# Patient Record
Sex: Male | Born: 1960 | Race: White | Hispanic: No | Marital: Single | Smoking: Light tobacco smoker
Health system: Southern US, Community
[De-identification: ages and names within clinical notes are randomized; demographics above are authoritative.]

## PROBLEM LIST (undated history)

## (undated) DIAGNOSIS — F200 Paranoid schizophrenia: Secondary | ICD-10-CM

## (undated) DIAGNOSIS — I219 Acute myocardial infarction, unspecified: Secondary | ICD-10-CM

## (undated) DIAGNOSIS — I2510 Atherosclerotic heart disease of native coronary artery without angina pectoris: Secondary | ICD-10-CM

## (undated) DIAGNOSIS — I739 Peripheral vascular disease, unspecified: Secondary | ICD-10-CM

## (undated) DIAGNOSIS — I10 Essential (primary) hypertension: Secondary | ICD-10-CM

## (undated) DIAGNOSIS — E119 Type 2 diabetes mellitus without complications: Secondary | ICD-10-CM

## (undated) DIAGNOSIS — R9439 Abnormal result of other cardiovascular function study: Secondary | ICD-10-CM

## (undated) DIAGNOSIS — E785 Hyperlipidemia, unspecified: Secondary | ICD-10-CM

## (undated) HISTORY — DX: Atherosclerotic heart disease of native coronary artery without angina pectoris: I25.10

## (undated) HISTORY — DX: Paranoid schizophrenia: F20.0

## (undated) HISTORY — DX: Type 2 diabetes mellitus without complications: E11.9

## (undated) HISTORY — DX: Peripheral vascular disease, unspecified: I73.9

## (undated) HISTORY — PX: COLONOSCOPY: SHX174

## (undated) HISTORY — DX: Acute myocardial infarction, unspecified: I21.9

## (undated) HISTORY — DX: Essential (primary) hypertension: I10

## (undated) HISTORY — DX: Hyperlipidemia, unspecified: E78.5

---

## 2009-12-12 DIAGNOSIS — I2510 Atherosclerotic heart disease of native coronary artery without angina pectoris: Secondary | ICD-10-CM

## 2009-12-12 DIAGNOSIS — I219 Acute myocardial infarction, unspecified: Secondary | ICD-10-CM

## 2009-12-12 HISTORY — DX: Acute myocardial infarction, unspecified: I21.9

## 2009-12-12 HISTORY — DX: Atherosclerotic heart disease of native coronary artery without angina pectoris: I25.10

## 2009-12-12 HISTORY — PX: CORONARY ARTERY BYPASS GRAFT: SHX141

## 2014-07-20 DIAGNOSIS — IMO0002 Reserved for concepts with insufficient information to code with codable children: Secondary | ICD-10-CM | POA: Diagnosis not present

## 2014-07-20 DIAGNOSIS — S139XXA Sprain of joints and ligaments of unspecified parts of neck, initial encounter: Secondary | ICD-10-CM | POA: Diagnosis not present

## 2014-08-07 DIAGNOSIS — I10 Essential (primary) hypertension: Secondary | ICD-10-CM | POA: Diagnosis not present

## 2014-08-07 DIAGNOSIS — E785 Hyperlipidemia, unspecified: Secondary | ICD-10-CM | POA: Diagnosis not present

## 2014-08-07 DIAGNOSIS — E559 Vitamin D deficiency, unspecified: Secondary | ICD-10-CM | POA: Diagnosis not present

## 2014-08-07 DIAGNOSIS — D649 Anemia, unspecified: Secondary | ICD-10-CM | POA: Diagnosis not present

## 2014-08-12 DIAGNOSIS — I10 Essential (primary) hypertension: Secondary | ICD-10-CM | POA: Diagnosis not present

## 2014-08-12 DIAGNOSIS — Z0000 Encounter for general adult medical examination without abnormal findings: Secondary | ICD-10-CM | POA: Diagnosis not present

## 2014-08-12 DIAGNOSIS — E7800 Pure hypercholesterolemia, unspecified: Secondary | ICD-10-CM | POA: Diagnosis not present

## 2014-08-12 DIAGNOSIS — E119 Type 2 diabetes mellitus without complications: Secondary | ICD-10-CM | POA: Diagnosis not present

## 2014-08-12 DIAGNOSIS — E559 Vitamin D deficiency, unspecified: Secondary | ICD-10-CM | POA: Diagnosis not present

## 2014-08-27 DIAGNOSIS — I10 Essential (primary) hypertension: Secondary | ICD-10-CM | POA: Diagnosis not present

## 2014-09-08 DIAGNOSIS — M5430 Sciatica, unspecified side: Secondary | ICD-10-CM | POA: Diagnosis not present

## 2014-09-08 DIAGNOSIS — M545 Low back pain, unspecified: Secondary | ICD-10-CM | POA: Diagnosis not present

## 2014-11-04 DIAGNOSIS — M542 Cervicalgia: Secondary | ICD-10-CM | POA: Diagnosis not present

## 2014-11-04 DIAGNOSIS — M549 Dorsalgia, unspecified: Secondary | ICD-10-CM | POA: Diagnosis not present

## 2014-11-23 DIAGNOSIS — I2510 Atherosclerotic heart disease of native coronary artery without angina pectoris: Secondary | ICD-10-CM | POA: Diagnosis not present

## 2014-11-23 DIAGNOSIS — Z951 Presence of aortocoronary bypass graft: Secondary | ICD-10-CM | POA: Diagnosis not present

## 2014-11-23 DIAGNOSIS — K029 Dental caries, unspecified: Secondary | ICD-10-CM | POA: Diagnosis not present

## 2014-11-23 DIAGNOSIS — F17200 Nicotine dependence, unspecified, uncomplicated: Secondary | ICD-10-CM | POA: Diagnosis not present

## 2014-11-23 DIAGNOSIS — E780 Pure hypercholesterolemia: Secondary | ICD-10-CM | POA: Diagnosis not present

## 2014-11-23 DIAGNOSIS — K088 Other specified disorders of teeth and supporting structures: Secondary | ICD-10-CM | POA: Diagnosis not present

## 2014-11-23 DIAGNOSIS — E119 Type 2 diabetes mellitus without complications: Secondary | ICD-10-CM | POA: Diagnosis not present

## 2014-11-23 DIAGNOSIS — I10 Essential (primary) hypertension: Secondary | ICD-10-CM | POA: Diagnosis not present

## 2015-02-21 DIAGNOSIS — R202 Paresthesia of skin: Secondary | ICD-10-CM | POA: Diagnosis not present

## 2015-02-21 DIAGNOSIS — M79604 Pain in right leg: Secondary | ICD-10-CM | POA: Diagnosis not present

## 2015-02-21 DIAGNOSIS — M79661 Pain in right lower leg: Secondary | ICD-10-CM | POA: Diagnosis not present

## 2015-02-26 DIAGNOSIS — I70213 Atherosclerosis of native arteries of extremities with intermittent claudication, bilateral legs: Secondary | ICD-10-CM | POA: Diagnosis not present

## 2015-02-26 DIAGNOSIS — I10 Essential (primary) hypertension: Secondary | ICD-10-CM | POA: Diagnosis not present

## 2015-02-26 DIAGNOSIS — I2510 Atherosclerotic heart disease of native coronary artery without angina pectoris: Secondary | ICD-10-CM | POA: Diagnosis not present

## 2015-02-26 DIAGNOSIS — E780 Pure hypercholesterolemia: Secondary | ICD-10-CM | POA: Diagnosis not present

## 2015-03-23 DIAGNOSIS — Z5321 Procedure and treatment not carried out due to patient leaving prior to being seen by health care provider: Secondary | ICD-10-CM | POA: Diagnosis not present

## 2015-03-23 DIAGNOSIS — R200 Anesthesia of skin: Secondary | ICD-10-CM | POA: Diagnosis not present

## 2015-03-23 DIAGNOSIS — I748 Embolism and thrombosis of other arteries: Secondary | ICD-10-CM | POA: Diagnosis not present

## 2015-03-23 DIAGNOSIS — Z79899 Other long term (current) drug therapy: Secondary | ICD-10-CM | POA: Diagnosis not present

## 2015-03-23 DIAGNOSIS — I252 Old myocardial infarction: Secondary | ICD-10-CM | POA: Diagnosis not present

## 2015-03-23 DIAGNOSIS — F17200 Nicotine dependence, unspecified, uncomplicated: Secondary | ICD-10-CM | POA: Diagnosis not present

## 2015-03-23 DIAGNOSIS — I743 Embolism and thrombosis of arteries of the lower extremities: Secondary | ICD-10-CM | POA: Diagnosis not present

## 2015-03-23 DIAGNOSIS — E119 Type 2 diabetes mellitus without complications: Secondary | ICD-10-CM | POA: Diagnosis not present

## 2015-03-23 DIAGNOSIS — I2510 Atherosclerotic heart disease of native coronary artery without angina pectoris: Secondary | ICD-10-CM | POA: Diagnosis not present

## 2015-03-23 DIAGNOSIS — I10 Essential (primary) hypertension: Secondary | ICD-10-CM | POA: Diagnosis not present

## 2015-03-26 ENCOUNTER — Encounter: Payer: Self-pay | Admitting: *Deleted

## 2015-03-26 ENCOUNTER — Other Ambulatory Visit: Payer: Self-pay | Admitting: *Deleted

## 2015-03-26 DIAGNOSIS — I739 Peripheral vascular disease, unspecified: Secondary | ICD-10-CM

## 2015-03-27 ENCOUNTER — Ambulatory Visit (INDEPENDENT_AMBULATORY_CARE_PROVIDER_SITE_OTHER): Payer: Medicare Other | Admitting: Vascular Surgery

## 2015-03-27 ENCOUNTER — Other Ambulatory Visit: Payer: Self-pay

## 2015-03-27 ENCOUNTER — Ambulatory Visit (HOSPITAL_COMMUNITY)
Admission: RE | Admit: 2015-03-27 | Discharge: 2015-03-27 | Disposition: A | Discharge status: Home / Self Care | Payer: Medicare Other | Source: Ambulatory Visit | Attending: Vascular Surgery | Admitting: Vascular Surgery

## 2015-03-27 ENCOUNTER — Encounter: Payer: Self-pay | Admitting: Vascular Surgery

## 2015-03-27 VITALS — BP 129/72 | HR 82 | Ht 70.0 in | Wt 201.7 lb

## 2015-03-27 DIAGNOSIS — I70223 Atherosclerosis of native arteries of extremities with rest pain, bilateral legs: Secondary | ICD-10-CM | POA: Diagnosis not present

## 2015-03-27 DIAGNOSIS — I70229 Atherosclerosis of native arteries of extremities with rest pain, unspecified extremity: Secondary | ICD-10-CM

## 2015-03-27 DIAGNOSIS — I739 Peripheral vascular disease, unspecified: Secondary | ICD-10-CM | POA: Diagnosis not present

## 2015-03-27 NOTE — Progress Notes (Signed)
Referred by:  [facility]  Reason for referral: right foot ischemia  History of Present Illness  Hector RavelJohn Wayne Green is a 54 y.o. (1961/07/05) male who presents with chief complaint: right foot numbness and pain that started 2.5 weeks ago. The pain is sharp and "stab-like" when he wears certain shoes or walks on "rocks." He also describes a history of bilateral calf claudication, right significantly greater than left, for the past 3 years. He describes being able to walk less than a block before having to stop. Rest make this pain feel better. He denies any buttock or thigh claudication. He denies any prior history of non-healing wounds or rest pain. Atherosclerotic risk factors include: current smoker, diabetes mellitus, hypertension, hyperlipidemia, CAD. He has a past medical history of CABG with right great saphenous vein harvest.   He [place] ago [place] to be closer to his 53 year old son.  He is on a baby aspirin. He is not on a statin. He is not on any blood thinners. He is on an antihypertensive medication and metformin. He is unable to name all his current medications.   Past Medical History  Diagnosis Date  . Myocardial infarction   . Hypertension   . Diabetes mellitus without complication   . Hyperlipidemia   . Peripheral arterial disease     Past Surgical History  Procedure Laterality Date  . Coronary artery bypass graft      History   Social History  . Marital Status: Unknown    Spouse Name: N/A  . Number of Children: N/A  . Years of Education: N/A   Occupational History  . Not on file.   Social History Main Topics  . Smoking status: Current Every Day Smoker -- 0.50 packs/day for 15 years    Types: Cigarettes  . Smokeless tobacco: Not on file  . Alcohol Use: No  . Drug Use: No  . Sexual Activity: Not on file   Other Topics Concern  . Not on file   Social History Narrative    Family History  Problem Relation Age of Onset    . Cancer Father       Current Outpatient Prescriptions on File Prior to Visit  Medication Sig Dispense Refill  . metFORMIN (GLUCOPHAGE) 500 MG tablet Take 500 mg by mouth.     No current facility-administered medications on file prior to visit.    Allergies  Allergen Reactions  . Fish-Derived Products Anaphylaxis  . Penicillins Anaphylaxis    REVIEW OF SYSTEMS:  (Positives checked otherwise negative)  CARDIOVASCULAR:  []  chest pain, []  chest pressure, []  palpitations, []  shortness of breath when laying flat, []  shortness of breath with exertion,  [x]  pain in feet when walking, [x]  pain in feet when laying flat, []  history of blood clot in veins (DVT), []  history of phlebitis, []  swelling in legs, []  varicose veins  PULMONARY:  []  productive cough, []  asthma, []  wheezing  NEUROLOGIC:  [x]  weakness in arms or legs, [x]  numbness in arms or legs, []  difficulty speaking or slurred speech, []  temporary loss of vision in one eye, []  dizziness  HEMATOLOGIC:  []  bleeding problems, []  problems with blood clotting too easily  MUSCULOSKEL:  []  joint pain, []  joint swelling  GASTROINTEST:  []  vomiting blood, []  blood in stool     GENITOURINARY:  []  burning with urination, []  blood in urine  PSYCHIATRIC:  []  history of major depression  INTEGUMENTARY:  []  rashes, []   ulcers  CONSTITUTIONAL:   fever,  chills  For VQI Use Only    PRE-ADM LIVING: Home  AMB STATUS: Ambulatory  CAD Sx: None  PRIOR CHF: None  STRESS TEST:  No,  Normal,  + ischemia,  + MI,  Both   Physical Examination  Filed Vitals:   03/27/15 1608  BP: 129/72  Pulse: 82  Height:  (1.778 m)  Weight: 201 lb 11.2 oz (91.491 kg)  SpO2: 98%   Body mass index is 28.94 kg/(m^2).  General: A&O x 3, WDWN male in NAD  Head: [place]/AT  Neck: Supple, no LAD  Pulmonary: Sym exp, good air movt, CTAB, no rales, rhonchi, & wheezing  Cardiac: RRR, Nl S1, S2, no Murmurs, rubs or  gallops  Vascular: Vessel Right Left  Radial Palpable Palpable  Ulnar Palpable Palpable  Brachial Palpable Palpable  Carotid Palpable, without bruit Palpable, without bruit  Aorta Not palpable N/A  Femoral Palpable, weaker than left Palpable  Popliteal Not palpable Not palpable  PT Not palpable Not palpable  DP Not palpable Not palpable   Gastrointestinal: soft, NTND, -G/R, - HSM, - masses  Musculoskeletal: M/S 5/5 throughout, Extremities without obvious ischemic changes. No wounds, no gangrene, no cellulitis.  R forefoot rubor  Neurologic: CN 2-12 intact. Pain and light touch intact in extremities except R foot decreased. Motor exam as listed above  Psychiatric: Judgment intact, Mood & affect appropriate for pt's clinical situation  Dermatologic: See M/S exam for extremity exam, no rashes otherwise noted  Non-Invasive Vascular Imaging  ABI (Date: 03/27/2015)  R: 0.36, TBI: not detected, monophasic PT, peroneal and AT not dected  L: 0.71, TBI: 0.40, monophasic peroneal and PT, AT not detected.   Outside Studies/Documentation 8 pages of outside documents were reviewed including: medical records from [facility] and CTA aortibifemoral results  Medical Decision Making  Americo Vallery is a 54 y.o. male who presents with: right lower leg critical limb ischemia, left lower leg intermittent claudication, likely bilateral distal SFA/proximal popliteal occlusions   Explained to the patient that he is at risk for limb loss.   Given the limb threatening status of this patient, I recommend an aggressive work up including proceeding with an: Aortogram, Bilateral runoff and possible right leg intervention. I discussed with the patient the nature of angiographic procedures, especially the limited patencies of any endovascular intervention. The patient is aware of that the risks of an angiographic procedure include but are not limited to: bleeding, infection, access site  complications, possible need for emergent surgical intervention, and possible need for surgical procedures to treat the patient's pathology. The patient is aware of the risks and agrees to proceed.  The procedure is scheduled for: 03/30/15 with Dr. Imogene Burn.  Discussed that he may need to be admitted following his procedure and be cleared by cardiology if he requires surgical revascularization. He has a history of right GSV harvest for CABG.  The patient is currently not on a statin.  The patient is currently on an anti-platelet: ASA.    Maris Berger, PA-C [facility] [place] Office: [phone] Pager: [phone]  03/27/2015, 4:44 PM  This patient was seen in conjunction with Dr. Imogene Burn.  Addendum  I have independently interviewed and examined the patient, and I agree with the physician assistant's findings.  Pt does not have a threatened R limb despite his numbness as he has intact PF/DF.  He has had numbness in the right foot > 2  weeks now.  On ABI, he has a flatline tracing in AT and peroneal with a tracing in PT c/w critical disease.  He will need ASAP angiographic studies to determine his reconstruction options.  I will likely admit him post-operatively to expedite his cardiac work-up and vein mapping, with plan of possible revascularization by Wednesday if nothing endovascularly is possible.  His history is not consistent with acute thromboembolism but his history is also not entire consistent with acute on chronic PAD.  I reviewed his CTA Abd/pelvis with runoff.  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  I can't tell if he as persistent perfusion in his tibials bilaterally or extensive calcification due to the poor quality of study.  Leonides Sake, MD [facility] [place] Office: [phone] Pager: [phone]  03/27/2015, 5:05 PM

## 2015-03-29 MED ORDER — SODIUM CHLORIDE 0.9 % IV SOLN
INTRAVENOUS | Status: DC
  Administered 2015-03-30: 11:00:00 via INTRAVENOUS

## 2015-03-30 ENCOUNTER — Ambulatory Visit (HOSPITAL_COMMUNITY)
Admission: RE | Admit: 2015-03-30 | Discharge: 2015-03-30 | Disposition: A | Discharge status: Home / Self Care | Payer: Medicare Other | Source: Ambulatory Visit | Attending: Vascular Surgery | Admitting: Vascular Surgery

## 2015-03-30 ENCOUNTER — Encounter (HOSPITAL_COMMUNITY)
Admission: RE | Disposition: A | Discharge status: Home / Self Care | Payer: Self-pay | Source: Ambulatory Visit | Attending: Vascular Surgery

## 2015-03-30 ENCOUNTER — Encounter (HOSPITAL_COMMUNITY): Payer: Self-pay | Admitting: Vascular Surgery

## 2015-03-30 DIAGNOSIS — Z951 Presence of aortocoronary bypass graft: Secondary | ICD-10-CM

## 2015-03-30 DIAGNOSIS — I70221 Atherosclerosis of native arteries of extremities with rest pain, right leg: Secondary | ICD-10-CM

## 2015-03-30 DIAGNOSIS — Z79899 Other long term (current) drug therapy: Secondary | ICD-10-CM

## 2015-03-30 DIAGNOSIS — E785 Hyperlipidemia, unspecified: Secondary | ICD-10-CM | POA: Diagnosis not present

## 2015-03-30 DIAGNOSIS — I10 Essential (primary) hypertension: Secondary | ICD-10-CM | POA: Diagnosis present

## 2015-03-30 DIAGNOSIS — Z7982 Long term (current) use of aspirin: Secondary | ICD-10-CM | POA: Insufficient documentation

## 2015-03-30 DIAGNOSIS — I252 Old myocardial infarction: Secondary | ICD-10-CM

## 2015-03-30 DIAGNOSIS — E119 Type 2 diabetes mellitus without complications: Secondary | ICD-10-CM

## 2015-03-30 DIAGNOSIS — I701 Atherosclerosis of renal artery: Secondary | ICD-10-CM

## 2015-03-30 DIAGNOSIS — F17210 Nicotine dependence, cigarettes, uncomplicated: Secondary | ICD-10-CM

## 2015-03-30 DIAGNOSIS — I2510 Atherosclerotic heart disease of native coronary artery without angina pectoris: Secondary | ICD-10-CM | POA: Diagnosis present

## 2015-03-30 DIAGNOSIS — I70202 Unspecified atherosclerosis of native arteries of extremities, left leg: Secondary | ICD-10-CM | POA: Insufficient documentation

## 2015-03-30 DIAGNOSIS — I70229 Atherosclerosis of native arteries of extremities with rest pain, unspecified extremity: Secondary | ICD-10-CM | POA: Diagnosis present

## 2015-03-30 DIAGNOSIS — Z5321 Procedure and treatment not carried out due to patient leaving prior to being seen by health care provider: Secondary | ICD-10-CM | POA: Diagnosis present

## 2015-03-30 HISTORY — PX: ABDOMINAL AORTAGRAM: SHX5454

## 2015-03-30 HISTORY — PX: LOWER EXTREMITY ANGIOGRAM: SHX5508

## 2015-03-30 LAB — POCT I-STAT, CHEM 8
BUN: 19 mg/dL (ref 6–23)
CALCIUM ION: 1.17 mmol/L (ref 1.12–1.23)
Chloride: 101 mmol/L (ref 96–112)
Creatinine, Ser: 0.8 mg/dL (ref 0.50–1.35)
Glucose, Bld: 288 mg/dL — ABNORMAL HIGH (ref 70–99)
HEMATOCRIT: 49 % (ref 39.0–52.0)
HEMOGLOBIN: 16.7 g/dL (ref 13.0–17.0)
Potassium: 4.8 mmol/L (ref 3.5–5.1)
Sodium: 134 mmol/L — ABNORMAL LOW (ref 135–145)
TCO2: 21 mmol/L (ref 0–100)

## 2015-03-30 SURGERY — ABDOMINAL AORTAGRAM
Anesthesia: LOCAL

## 2015-03-30 MED ORDER — OXYCODONE-ACETAMINOPHEN 5-325 MG PO TABS: 1.0000 | ORAL_TABLET | ORAL | Status: DC | PRN

## 2015-03-30 MED ORDER — MORPHINE SULFATE 2 MG/ML IJ SOLN: 2.0000 mg | INTRAMUSCULAR | Status: DC | PRN

## 2015-03-30 MED ORDER — HEPARIN (PORCINE) IN NACL 2-0.9 UNIT/ML-% IJ SOLN
INTRAMUSCULAR | Status: AC
  Filled 2015-03-30: qty 1000

## 2015-03-30 MED ORDER — FENTANYL CITRATE (PF) 100 MCG/2ML IJ SOLN
INTRAMUSCULAR | Status: AC
  Filled 2015-03-30: qty 2

## 2015-03-30 MED ORDER — MIDAZOLAM HCL 2 MG/2ML IJ SOLN
INTRAMUSCULAR | Status: AC
  Filled 2015-03-30: qty 2

## 2015-03-30 MED ORDER — SODIUM CHLORIDE 0.9 % IV SOLN: 1.0000 mL/kg/h | INTRAVENOUS | Status: DC

## 2015-03-30 MED ORDER — LIDOCAINE HCL (PF) 1 % IJ SOLN
INTRAMUSCULAR | Status: AC
  Filled 2015-03-30: qty 30

## 2015-03-30 NOTE — Discharge Instructions (Addendum)
°  ADMITTING WILL CALL TOMORROW TO GIVE YOU A TIME TO ARRIVE TO BE ADMITTED. SURGERY IS SCHEDULED Wednesday.  HOLD METFORMIN FOR 2 DAYS.  Arteriogram Care After These instructions give you information on caring for yourself after your procedure. Your doctor may also give you more specific instructions. Call your doctor if you have any problems or questions after your procedure. HOME CARE  Do not bathe, swim, or use a hot tub until directed by your doctor. You can shower.  Do not lift anything heavier than 10 pounds (about a gallon of milk) for 2 days.  Do not walk a lot, run, or drive for 2 days.  Return to normal activities in 2 days or as told by your doctor. Finding out the results of your test Ask when your test results will be ready. Make sure you get your test results. GET HELP RIGHT AWAY IF:   You have fever.  You have more pain in your leg.  The leg that was cut is:  Bleeding.  Puffy (swollen) or red.  Cold.  Pale or changes color.  Weak.  Tingly or numb. If you go to the Emergency Room, tell your nurse that you have had an arteriogram. Take this paper with you to show the nurse. MAKE SURE YOU:  Understand these instructions.  Will watch your condition.  Will get help right away if you are not doing well or get worse. Document Released: 02/24/2009 Document Revised: 12/03/2013 Document Reviewed: 02/24/2009 [facility] Patient Information 2015 [place] [place]. This information is not intended to replace advice given to you by your health care provider. Make sure you discuss any questions you have with your health care provider.

## 2015-03-30 NOTE — Interval H&P Note (Signed)
[facility] [place]  History and Physical Update  The patient was interviewed and re-examined.  The patient's previous History and Physical has been reviewed and is unchanged from my consult.  There is no change in the plan of care: Aortogram, bilateral leg runoff, possible right leg intervention.  I discussed with the patient the nature of angiographic procedures, especially the limited patencies of any endovascular intervention.  The patient is aware of that the risks of an angiographic procedure include but are not limited to: bleeding, infection, access site complications, renal failure, embolization, rupture of vessel, dissection, possible need for emergent surgical intervention, possible need for surgical procedures to treat the patient's pathology, anaphylactic reaction to contrast, and stroke and death.  The patient is aware of the risks and agrees to proceed.   Leonides SakeBrian Drexel Ivey, MD [facility] [place] Office: [phone] Pager: [phone]  03/30/2015, 10:45 AM

## 2015-03-30 NOTE — H&P (View-Only) (Signed)
  Referred by:  [facility]  Reason for referral: right foot ischemia  History of Present Illness  Hector Green is a 54 y.o. (08/13/1961) male who presents with chief complaint: right foot numbness and pain that started 2.5 weeks ago. The pain is sharp and "stab-like" when he wears certain shoes or walks on "rocks." He also describes a history of bilateral calf claudication, right significantly greater than left, for the past 3 years. He describes being able to walk less than a block before having to stop. Rest make this pain feel better. He denies any buttock or thigh claudication. He denies any prior history of non-healing wounds or rest pain. Atherosclerotic risk factors include: current smoker, diabetes mellitus, hypertension, hyperlipidemia, CAD. He has a past medical history of CABG with right great saphenous vein harvest.   He recently moved here one week ago from [place] to be closer to his 7 year old son.  He is on a baby aspirin. He is not on a statin. He is not on any blood thinners. He is on an antihypertensive medication and metformin. He is unable to name all his current medications.   Past Medical History  Diagnosis Date  . Myocardial infarction   . Hypertension   . Diabetes mellitus without complication   . Hyperlipidemia   . Peripheral arterial disease     Past Surgical History  Procedure Laterality Date  . Coronary artery bypass graft      History   Social History  . Marital Status: Unknown    Spouse Name: N/A  . Number of Children: N/A  . Years of Education: N/A   Occupational History  . Not on file.   Social History Main Topics  . Smoking status: Current Every Day Smoker -- 0.50 packs/day for 15 years    Types: Cigarettes  . Smokeless tobacco: Not on file  . Alcohol Use: No  . Drug Use: No  . Sexual Activity: Not on file   Other Topics Concern  . Not on file   Social History Narrative    Family History  Problem Relation Age of Onset    . Cancer Father       Current Outpatient Prescriptions on File Prior to Visit  Medication Sig Dispense Refill  . metFORMIN (GLUCOPHAGE) 500 MG tablet Take 500 mg by mouth.     No current facility-administered medications on file prior to visit.    Allergies  Allergen Reactions  . Fish-Derived Products Anaphylaxis  . Penicillins Anaphylaxis    REVIEW OF SYSTEMS:  (Positives checked otherwise negative)  CARDIOVASCULAR:  [] chest pain, [] chest pressure, [] palpitations, [] shortness of breath when laying flat, [] shortness of breath with exertion,  [x] pain in feet when walking, [x] pain in feet when laying flat, [] history of blood clot in veins (DVT), [] history of phlebitis, [] swelling in legs, [] varicose veins  PULMONARY:  [] productive cough, [] asthma, [] wheezing  NEUROLOGIC:  [x] weakness in arms or legs, [x] numbness in arms or legs, [] difficulty speaking or slurred speech, [] temporary loss of vision in one eye, [] dizziness  HEMATOLOGIC:  [] bleeding problems, [] problems with blood clotting too easily  MUSCULOSKEL:  [] joint pain, [] joint swelling  GASTROINTEST:  [] vomiting blood, [] blood in stool     GENITOURINARY:  [] burning with urination, [] blood in urine  PSYCHIATRIC:  [] history of major depression  INTEGUMENTARY:  [] rashes, []   ulcers  CONSTITUTIONAL:  [] fever, [] chills  For VQI Use Only    PRE-ADM LIVING: Home  AMB STATUS: Ambulatory  CAD Sx: None  PRIOR CHF: None  STRESS TEST: [x] No, [ ] Normal, [ ] + ischemia, [ ] + MI, [ ] Both   Physical Examination  Filed Vitals:   03/27/15 1608  BP: 129/72  Pulse: 82  Height: 5' 10" (1.778 m)  Weight: 201 lb 11.2 oz (91.491 kg)  SpO2: 98%   Body mass index is 28.94 kg/(m^2).  General: A&O x 3, WDWN male in NAD  Head: [place]/AT  Neck: Supple, no LAD  Pulmonary: Sym exp, good air movt, CTAB, no rales, rhonchi, & wheezing  Cardiac: RRR, Nl S1, S2, no Murmurs, rubs or  gallops  Vascular: Vessel Right Left  Radial Palpable Palpable  Ulnar Palpable Palpable  Brachial Palpable Palpable  Carotid Palpable, without bruit Palpable, without bruit  Aorta Not palpable N/A  Femoral Palpable, weaker than left Palpable  Popliteal Not palpable Not palpable  PT Not palpable Not palpable  DP Not palpable Not palpable   Gastrointestinal: soft, NTND, -G/R, - HSM, - masses  Musculoskeletal: M/S 5/5 throughout, Extremities without obvious ischemic changes. No wounds, no gangrene, no cellulitis.  R forefoot rubor  Neurologic: CN 2-12 intact. Pain and light touch intact in extremities except R foot decreased. Motor exam as listed above  Psychiatric: Judgment intact, Mood & affect appropriate for pt's clinical situation  Dermatologic: See M/S exam for extremity exam, no rashes otherwise noted  Non-Invasive Vascular Imaging  ABI (Date: 03/27/2015)  R: 0.36, TBI: not detected, monophasic PT, peroneal and AT not dected  L: 0.71, TBI: 0.40, monophasic peroneal and PT, AT not detected.   Outside Studies/Documentation 8 pages of outside documents were reviewed including: medical records from [facility] and CTA aortibifemoral results  Medical Decision Making  Hector Green is a 54 y.o. male who presents with: right lower leg critical limb ischemia, left lower leg intermittent claudication, likely bilateral distal SFA/proximal popliteal occlusions   Explained to the patient that he is at risk for limb loss.   Given the limb threatening status of this patient, I recommend an aggressive work up including proceeding with an: Aortogram, Bilateral runoff and possible right leg intervention. I discussed with the patient the nature of angiographic procedures, especially the limited patencies of any endovascular intervention. The patient is aware of that the risks of an angiographic procedure include but are not limited to: bleeding, infection, access site  complications, possible need for emergent surgical intervention, and possible need for surgical procedures to treat the patient's pathology. The patient is aware of the risks and agrees to proceed.  The procedure is scheduled for: 03/30/15 with Dr. Kamil Hanigan.  Discussed that he may need to be admitted following his procedure and be cleared by cardiology if he requires surgical revascularization. He has a history of right GSV harvest for CABG.  The patient is currently not on a statin.  The patient is currently on an anti-platelet: ASA.    Kimberly Trinh, PA-C [facility] [place] Office: [phone] Pager: [phone]  03/27/2015, 4:44 PM  This patient was seen in conjunction with Dr. Hollie Bartus.  Addendum  I have independently interviewed and examined the patient, and I agree with the physician assistant's findings.  Pt does not have a threatened R limb despite his numbness as he has intact PF/DF.  He has had numbness in the right foot > 2   weeks now.  On ABI, he has a flatline tracing in AT and peroneal with a tracing in PT c/w critical disease.  He will need ASAP angiographic studies to determine his reconstruction options.  I will likely admit him post-operatively to expedite his cardiac work-up and vein mapping, with plan of possible revascularization by Wednesday if nothing endovascularly is possible.  His history is not consistent with acute thromboembolism but his history is also not entire consistent with acute on chronic PAD.  I reviewed his CTA Abd/pelvis with runoff.  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  I can't tell if he as persistent perfusion in his tibials bilaterally or extensive calcification due to the poor quality of study.  Lashe Oliveira, MD [facility]  Office: [phone] Pager: [phone]  03/27/2015, 5:05 PM   

## 2015-03-30 NOTE — Op Note (Addendum)
OPERATIVE NOTE   PROCEDURE: 1.  Left common femoral artery cannulation under ultrasound guidance 2.  Placement of catheter in aorta 3.  Aortogram 4.  Second order arterial selection 5.  Right leg runoff via catheter 6.  Left leg runoff via sheath 7.  Radiology S&I  PRE-OPERATIVE DIAGNOSIS: right leg rest pain  POST-OPERATIVE DIAGNOSIS: same as above   SURGEON: Leonides Sake, MD  ANESTHESIA: conscious sedation  ESTIMATED BLOOD LOSS: 50 cc  CONTRAST: 90 cc  FINDING(S):  Aorta: patent  Superior mesenteric artery: patent Celiac artery: not visualized   Right Left  RA Patent, 50% stenosis in proximal segment Patent  CIA Patent, calcific Patent, calcific  EIA Patent, calcific Patent, calcific  IIA Patent Patent  CFA Patent, 50% proximal stenosis Patent  SFA Patent proximally, 50% proximal 1/3 stenosis with distal short segment occlusion Patent, 50% proximal segment stenosis, long 50-75% stenosis in mid-segment with distal occlusion  PFA Patent proximally, diseased distally past first bifurcation Patent, collaterals reconstitute above-the-knee popliteal artery  Pop Reconstituted via extensive collaterals, diffusely diseased Patent, reconstituted from profunda collaterals  Trif Occluded Patent, medial geniculate provides additional perfusion  AT Occluded Occluded  Pero Occluded proximally, reconstituted via collaterals distally but limited distal perfusion Patent  PT Patent, reconstituted via medial collaterals, only runoff Patent   SPECIMEN(S):  none  INDICATIONS:   Hector Green is a 54 y.o. male who presents with two months of anesthesia in his right leg with severe to critical PAD in right leg.  The patient presents for: aortogram, bilateral leg runoff, possible right leg intervention.  I discussed with the patient the nature of angiographic procedures, especially the limited patencies of any endovascular intervention.  The patient is aware of that the risks of an  angiographic procedure include but are not limited to: bleeding, infection, access site complications, renal failure, embolization, rupture of vessel, dissection, possible need for emergent surgical intervention, possible need for surgical procedures to treat the patient's pathology, and stroke and death.  The patient is aware of the risks and agrees to proceed.  DESCRIPTION: After full informed consent was obtained from the patient, the patient was brought back to the angiography suite.  The patient was placed supine upon the angiography table and connected to monitoring equipment.  The patient was then given conscious sedation, the amounts of which are documented in the patient's chart.  The patient was prepped and drape in the standard fashion for an angiographic procedure.  At this point, attention was turned to the left groin.  Under ultrasound guidance, the subcutaneous tissue surrounding the left common femoral artery was anesthesized with 1% lidocaine with epinephrine.  The artery was then cannulated with a micropuncture needle.  The microwire was advanced into the iliac arterial system.  The needle was exchanged for a microsheath, which was loaded into the common femoral artery over the wire.  The microwire was exchanged for a [facility] wire which was advanced into the aorta.  The microsheath was then exchanged for a 5-Fr sheath which was loaded into the common femoral artery.  The Omniflush catheter was then loaded over the wire up to the level of L1.  The catheter was connected to the power injector circuit.  After de-airring and de-clotting the circuit, a power injector aortogram was completed.  The [facility] wire was replaced in the catheter, and using the [place] and Omniflush catheter, the right common iliac artery was selected.  The catheter and wire were advanced into the external iliac artery.  An automated right leg runoff was completed.  The findings are listed above.  I replaced the wire into the  catheter, straightening out the crook in the catheter.  Both were removed from the sheath together.  The sheath was aspirated.  No clots were present and the sheath was reloaded with heparinized saline.  The left femoral sheath was connected to a power injector.  An automated left leg runoff was completed.  The findings are listed above.  Based on the images, the patient will need a right common femoral artery to posterior tibial artery bypass.  I offered the patient admission for expedited work-up.  The patient will have to come back tomorrow for such given child care conflicts.  The patient is tenatively scheduled for surgery on this coming Wednesday.   COMPLICATIONS: none  CONDITION: stable   Leonides SakeBrian Ell Tiso, MD [facility] [place] Office: [phone] Pager: [phone]  03/30/2015, 12:56 PM

## 2015-03-31 ENCOUNTER — Inpatient Hospital Stay (HOSPITAL_COMMUNITY)
Admission: AD | Admit: 2015-03-31 | Discharge: 2015-03-31 | DRG: 301 | Discharge status: Against Medical Advice | Payer: Medicare Other | Source: Ambulatory Visit | Attending: Vascular Surgery | Admitting: Vascular Surgery

## 2015-03-31 ENCOUNTER — Encounter (HOSPITAL_COMMUNITY): Payer: Self-pay | Admitting: General Practice

## 2015-03-31 DIAGNOSIS — I10 Essential (primary) hypertension: Secondary | ICD-10-CM | POA: Diagnosis present

## 2015-03-31 DIAGNOSIS — I701 Atherosclerosis of renal artery: Secondary | ICD-10-CM | POA: Diagnosis present

## 2015-03-31 DIAGNOSIS — I70229 Atherosclerosis of native arteries of extremities with rest pain, unspecified extremity: Secondary | ICD-10-CM

## 2015-03-31 DIAGNOSIS — F17210 Nicotine dependence, cigarettes, uncomplicated: Secondary | ICD-10-CM | POA: Diagnosis present

## 2015-03-31 DIAGNOSIS — E785 Hyperlipidemia, unspecified: Secondary | ICD-10-CM | POA: Diagnosis present

## 2015-03-31 DIAGNOSIS — Z01818 Encounter for other preprocedural examination: Secondary | ICD-10-CM | POA: Diagnosis not present

## 2015-03-31 DIAGNOSIS — I252 Old myocardial infarction: Secondary | ICD-10-CM | POA: Diagnosis not present

## 2015-03-31 DIAGNOSIS — Z79899 Other long term (current) drug therapy: Secondary | ICD-10-CM | POA: Diagnosis not present

## 2015-03-31 DIAGNOSIS — I70221 Atherosclerosis of native arteries of extremities with rest pain, right leg: Secondary | ICD-10-CM | POA: Diagnosis present

## 2015-03-31 DIAGNOSIS — I2510 Atherosclerotic heart disease of native coronary artery without angina pectoris: Secondary | ICD-10-CM | POA: Diagnosis present

## 2015-03-31 DIAGNOSIS — E119 Type 2 diabetes mellitus without complications: Secondary | ICD-10-CM | POA: Diagnosis present

## 2015-03-31 DIAGNOSIS — Z7982 Long term (current) use of aspirin: Secondary | ICD-10-CM | POA: Diagnosis not present

## 2015-03-31 DIAGNOSIS — Z951 Presence of aortocoronary bypass graft: Secondary | ICD-10-CM | POA: Diagnosis not present

## 2015-03-31 DIAGNOSIS — M79609 Pain in unspecified limb: Secondary | ICD-10-CM | POA: Diagnosis not present

## 2015-03-31 DIAGNOSIS — I998 Other disorder of circulatory system: Secondary | ICD-10-CM | POA: Diagnosis present

## 2015-03-31 DIAGNOSIS — Z5321 Procedure and treatment not carried out due to patient leaving prior to being seen by health care provider: Secondary | ICD-10-CM | POA: Diagnosis present

## 2015-03-31 DIAGNOSIS — R200 Anesthesia of skin: Secondary | ICD-10-CM | POA: Diagnosis not present

## 2015-03-31 LAB — GLUCOSE, CAPILLARY: Glucose-Capillary: 428 mg/dL — ABNORMAL HIGH (ref 70–99)

## 2015-03-31 MED ORDER — PHENOL 1.4 % MT LIQD: 1.0000 | OROMUCOSAL | Status: DC | PRN

## 2015-03-31 MED ORDER — LABETALOL HCL 5 MG/ML IV SOLN
10.0000 mg | INTRAVENOUS | Status: DC | PRN
  Filled 2015-03-31: qty 4

## 2015-03-31 MED ORDER — DOCUSATE SODIUM 100 MG PO CAPS
100.0000 mg | ORAL_CAPSULE | Freq: Every day | ORAL | Status: DC
  Filled 2015-03-31: qty 1

## 2015-03-31 MED ORDER — PANTOPRAZOLE SODIUM 40 MG PO TBEC
40.0000 mg | DELAYED_RELEASE_TABLET | Freq: Every day | ORAL | Status: DC
  Administered 2015-03-31: 40 mg via ORAL
  Filled 2015-03-31: qty 1

## 2015-03-31 MED ORDER — ALUM & MAG HYDROXIDE-SIMETH 200-200-20 MG/5ML PO SUSP: 15.0000 mL | ORAL | Status: DC | PRN

## 2015-03-31 MED ORDER — INSULIN ASPART 100 UNIT/ML ~~LOC~~ SOLN: 0.0000 [IU] | Freq: Three times a day (TID) | SUBCUTANEOUS | Status: DC

## 2015-03-31 MED ORDER — ENOXAPARIN SODIUM 40 MG/0.4ML ~~LOC~~ SOLN
40.0000 mg | SUBCUTANEOUS | Status: DC
  Administered 2015-03-31: 40 mg via SUBCUTANEOUS
  Filled 2015-03-31: qty 0.4

## 2015-03-31 MED ORDER — INSULIN ASPART 100 UNIT/ML ~~LOC~~ SOLN
0.0000 [IU] | Freq: Three times a day (TID) | SUBCUTANEOUS | Status: DC
  Administered 2015-03-31: 15 [IU] via SUBCUTANEOUS

## 2015-03-31 MED ORDER — VANCOMYCIN HCL IN DEXTROSE 1-5 GM/200ML-% IV SOLN: 1000.0000 mg | INTRAVENOUS | Status: DC

## 2015-03-31 MED ORDER — OXYCODONE HCL 5 MG PO TABS: 5.0000 mg | ORAL_TABLET | ORAL | Status: DC | PRN

## 2015-03-31 MED ORDER — ACETAMINOPHEN 650 MG RE SUPP: 325.0000 mg | RECTAL | Status: DC | PRN

## 2015-03-31 MED ORDER — BISACODYL 10 MG RE SUPP: 10.0000 mg | Freq: Every day | RECTAL | Status: DC | PRN

## 2015-03-31 MED ORDER — POTASSIUM CHLORIDE CRYS ER 20 MEQ PO TBCR: 20.0000 meq | EXTENDED_RELEASE_TABLET | Freq: Once | ORAL | Status: DC

## 2015-03-31 MED ORDER — INSULIN ASPART 100 UNIT/ML ~~LOC~~ SOLN
10.0000 [IU] | Freq: Once | SUBCUTANEOUS | Status: AC
  Administered 2015-03-31: 10 [IU] via SUBCUTANEOUS

## 2015-03-31 MED ORDER — HYDRALAZINE HCL 20 MG/ML IJ SOLN: 5.0000 mg | INTRAMUSCULAR | Status: DC | PRN

## 2015-03-31 MED ORDER — MORPHINE SULFATE 2 MG/ML IJ SOLN: 2.0000 mg | INTRAMUSCULAR | Status: DC | PRN

## 2015-03-31 MED ORDER — METOPROLOL TARTRATE 1 MG/ML IV SOLN: 2.0000 mg | INTRAVENOUS | Status: DC | PRN

## 2015-03-31 MED ORDER — ACETAMINOPHEN 325 MG PO TABS: 325.0000 mg | ORAL_TABLET | ORAL | Status: DC | PRN

## 2015-03-31 MED ORDER — ONDANSETRON HCL 4 MG/2ML IJ SOLN: 4.0000 mg | Freq: Four times a day (QID) | INTRAMUSCULAR | Status: DC | PRN

## 2015-03-31 MED ORDER — SODIUM CHLORIDE 0.9 % IV SOLN: INTRAVENOUS | Status: DC

## 2015-03-31 MED ORDER — GUAIFENESIN-DM 100-10 MG/5ML PO SYRP: 15.0000 mL | ORAL_SOLUTION | ORAL | Status: DC | PRN

## 2015-03-31 NOTE — H&P (Signed)
H&P  History of Present Illness  Hector Green is a 54 y.o. (July 14, 1961) male who presents with chief complaint: right foot numbness and pain that started 2.5 weeks ago. The pain is sharp and "stab-like" when he wears certain shoes or walks on "rocks." He also describes a history of bilateral calf claudication, right significantly greater than left, for the past 3 years. He describes being able to walk less than a block before having to stop. Rest make this pain feel better. He denies any buttock or thigh claudication. He denies any prior history of non-healing wounds or rest pain. Atherosclerotic risk factors include: current smoker, diabetes mellitus, hypertension, hyperlipidemia, CAD. He has a past medical history of CABG with right great saphenous vein harvest.   He recently moved here one week ago from [place] to be closer to his 32 year old son. He is on a baby aspirin. He is not on a statin. He is not on any blood thinners. He is on an antihypertensive medication and metformin. He is unable to name all his current medications.   He recently underwent aortogram with bilateral runoff on 03/30/15 by Dr. Imogene Burn for right leg rest pain.  This  revealed an occluded right distal SFA with one veseel runoff via the posterior tibial artery. He was informed that he was at risk for limb loss. Given child care conflicts, he returned to the hospital on 03/31/15 as a direct admission.   Past Medical History  Diagnosis Date  . Myocardial infarction   . Hypertension   . Diabetes mellitus without complication   . Hyperlipidemia   . Peripheral arterial disease     Past Surgical History  Procedure Laterality Date  . Coronary artery bypass graft      History   Social History  . Marital Status: Unknown    Spouse Name: N/A  . Number of Children: N/A  . Years of Education: N/A   Occupational History  . Not on file.   Social History Main  Topics  . Smoking status: Current Every Day Smoker -- 0.50 packs/day for 15 years    Types: Cigarettes  . Smokeless tobacco: Not on file  . Alcohol Use: No  . Drug Use: No  . Sexual Activity: Not on file   Other Topics Concern  . Not on file   Social History Narrative    Family History  Problem Relation Age of Onset  . Cancer Father       Current Outpatient Prescriptions on File Prior to Visit  Medication Sig Dispense Refill  . metFORMIN (GLUCOPHAGE) 500 MG tablet Take 500 mg by mouth.     No current facility-administered medications on file prior to visit.    Allergies  Allergen Reactions  . Fish-Derived Products Anaphylaxis  . Penicillins Anaphylaxis    REVIEW OF SYSTEMS: (Positives checked otherwise negative)  CARDIOVASCULAR:  chest pain,  chest pressure,  palpitations,  shortness of breath when laying flat,  shortness of breath with exertion,  pain in feet when walking,  pain in feet when laying flat,  history of blood clot in veins (DVT),  history of phlebitis,  swelling in legs,  varicose veins  PULMONARY:  productive cough,  asthma,  wheezing  NEUROLOGIC:  weakness in arms or legs,  numbness in arms or legs,  difficulty speaking or slurred speech,  temporary loss of vision in one eye,  dizziness  HEMATOLOGIC:  bleeding problems,  problems with blood clotting too easily  MUSCULOSKEL:  joint  pain, []  joint swelling  GASTROINTEST: []  vomiting blood, []  blood in stool   GENITOURINARY: []  burning with urination, []  blood in urine  PSYCHIATRIC: []  history of major depression  INTEGUMENTARY: []  rashes, []  ulcers  CONSTITUTIONAL: []  fever, []  chills  For VQI Use Only   PRE-ADM LIVING: Home  AMB STATUS: Ambulatory  CAD Sx: None  PRIOR CHF: None  STRESS TEST: [x]  No, [ ]  Normal, [ ]  + ischemia, [ ]  + MI, [ ]   Both   Physical Examination  Filed Vitals:   03/27/15 1608  BP: 129/72  Pulse: 82  Height: 5\' 10"  (1.778 m)  Weight: 201 lb 11.2 oz (91.491 kg)  SpO2: 98%   Body mass index is 28.94 kg/(m^2).  General: A&O x 3, WDWN male in NAD  Head: [place]/AT  Neck: Supple, no LAD  Pulmonary: Sym exp, good air movt, CTAB, no rales, rhonchi, & wheezing  Cardiac: RRR, Nl S1, S2, no Murmurs, rubs or gallops  Vascular: Vessel Right Left  Radial Palpable Palpable  Ulnar Palpable Palpable  Brachial Palpable Palpable  Carotid Palpable, without bruit Palpable, without bruit  Aorta Not palpable N/A  Femoral Palpable, weaker than left Palpable  Popliteal Not palpable Not palpable  PT Not palpable Not palpable  DP Not palpable Not palpable   Gastrointestinal: soft, NTND, -G/R, - HSM, - masses  Musculoskeletal: M/S 5/5 throughout, Extremities without obvious ischemic changes. No wounds, no gangrene, no cellulitis. R forefoot rubor  Neurologic: CN 2-12 intact. Pain and light touch intact in extremities except R foot decreased. Motor exam as listed above  Psychiatric: Judgment intact, Mood & affect appropriate for pt's clinical situation  Dermatologic: See M/S exam for extremity exam, no rashes otherwise noted  Non-Invasive Vascular Imaging  ABI (Date: 03/27/2015)  R: 0.36, TBI: not detected, monophasic PT, peroneal and AT not dected  L: 0.71, TBI: 0.40, monophasic peroneal and PT, AT not detected.  Medical Decision Making  Carney CornersJohn Wayne [name] is a 54 y.o. male who presents with: right lower leg critical limb ischemia, left lower leg intermittent claudication. His arteriogram yesterday revealed an occluded distal left SFA with one vessel runoff via the posterior tibial artery. He was directly admitted today with plans for right common femoral artery to posterior tibial artery bypass tomorrow 04/01/15 with Dr. Imogene Burnhen. Will obtain vein mapping and consult  cardiology for risk stratification and optimization.    Maris BergerKimberly Trinh, PA-C [facility] [place] Office: [phone] Pager: [phone]   Addendum  Since this consult.  The patient has undergone angiography which demonstrates he needs a right CFA to PT bypass for R foot rest pain with severe to critical PAD.  He is being admitted to expedite his his workup: Vein mapping, cardiology work-up for OR tomorrow.     Leonides SakeBrian Chen, MD [facility] [place] Office: [phone] Pager: [phone]  03/31/2015, 12:09 PM

## 2015-03-31 NOTE — Progress Notes (Signed)
Inpatient Diabetes Program Recommendations  AACE/ADA: New Consensus Statement on Inpatient Glycemic Control (2013)  Target Ranges:  Prepandial:   less than 140 mg/dL      Peak postprandial:   less than 180 mg/dL (1-2 hours)      Critically ill patients:  140 - 180 mg/dL   Reason for Assessment:  Referral received.  Results for Hector Green, Hector Green (MRN [id number]) as of 03/31/2015 13:39  Ref. Range 03/31/2015 11:19  Glucose-Capillary Latest Ref Range: 70-99 mg/dL 213428 (H)   Diabetes history: Type 2 diabetes Outpatient Diabetes medications: Metformin 500 mg daily Current orders for Inpatient glycemic control:  Novolog moderate tid with meals  Please add Lantus 18 units daily (0.2 units/kg) and Novolog 4 units tid with meals.  Also please check A1C to determine pre-hospitalization glycemic control.  Thanks,  Beryl MeagerJenny Eura Mccauslin, RN, BC-ADM Inpatient Diabetes Coordinator Pager [phone] (8a-5p)

## 2015-03-31 NOTE — Progress Notes (Signed)
Trinh,Kimberly A, PA-C returned page and was made aware of [facility]

## 2015-03-31 NOTE — Progress Notes (Addendum)
Patient wants to leave hospital against medical advice because  "i`m being stuck too many times and cant take it". He refused further blood draw,blood sugar checks, lovenox injections and also refused Peripheral IV. Patient advised against leaving the hospital AMA and verbalized understanding of the risks involved. He sign AMA form and walked out.

## 2015-03-31 NOTE — Progress Notes (Signed)
VASCULAR LAB PRELIMINARY  PRELIMINARY  PRELIMINARY  PRELIMINARY  Right Lower Extremity Vein Map    Right Great Saphenous Vein   Segment Diameter Comment  1. Origin 5.712mm     s  2. High Thigh 2.472mm Multiple branches  3. Mid Thigh 2.625mm Multiple branches  4. Low Thigh 1.767mm Multiple branches  5. At Knee 2.250mm Branch  6. High Calf 1.648mm Multiple branches difficult to evaluate the native vessel  7. Low Calf 2.681mm Branch 2.1 mm and 1.217mm  8. Ankle 2.990mm Multiple branches    Left Lower Extremity Vein Map    Left Great Saphenous Vein   Segment Diameter Comment  1. Origin 8.713mm   2. High Thigh 2.929mm Branches  3. Mid Thigh 3.125mm Branches  4. Low Thigh 2.559mm   5. At Knee 2.327mm   6. High Calf 2.586mm Branch 2.6 mm and 2.532mm  7. Low Calf 2.388mm Branches 2.8 mm, 2.632mm, and 1.1097mm  8. Ankle 3.732mm     Technically difficult due to multiple branches bilaterally making evaluation of the native vessel difficult to image  Carliyah Cotterman, RVT 03/31/2015, 7:05 PM

## 2015-03-31 NOTE — Progress Notes (Signed)
MD made aware of patient admission and blood sugar

## 2015-03-31 NOTE — Progress Notes (Signed)
Phone call to pt.  Stated "I just freaked out today because I am petrified of needles."  State he just isn't ready to have this surgery.  Pt. was very apologetic about leaving AMA; stated "people don't understand my fear, but I just can't do this."   Advised will update Dr. Imogene Burnhen about reason for leaving.

## 2015-03-31 NOTE — Progress Notes (Signed)
MD and PA paged

## 2015-03-31 NOTE — Progress Notes (Signed)
03/31/2015 1520 Chart reviewed. Utilization review complete. Isidoro DonningAlesia Seleta Hovland RN CCM Case Mgmt phone [phone]

## 2015-04-01 SURGERY — CREATION, BYPASS, ARTERIAL, FEMORAL TO TIBIAL, USING GRAFT
Anesthesia: General | Site: Leg Lower | Laterality: Right

## 2015-04-03 NOTE — Discharge Summary (Signed)
Physician Discharge Summary  Patient ID: Hector Green MRN: [id number] DOB/AGE: Mar 19, 1961 54 y.o.  Admit date: 03/31/2015 Left AMA: 03/31/15  Admission Diagnosis: Critical limb ischemia right leg  Discharge Diagnoses:  Left against medical advice  Secondary Diagnoses: Active Problems:   Critical lower limb ischemia Uncontrolled diabetes  Procedures: None   Discharged Condition: left against medical advice  Hospital Course:   [age] year male with rest pain in right foot with anesthesia likely due to ischemic neuropathy.  He underwent angiography on 03/30/15.  I offered the patient admission to expedite his work-up for right femorotibial bypass scheduled for Wednesday.  He refused due to childcare issues but agreed to be admitted on 03/31/15.  While admitted he obtained BLE vein mapping and diabetes nursing.  He was scheduled for preoperative Cardiac evaluation but he elected to leave against medical advice.  The patient was made aware of risk of limb loss and death related to his ischemic right leg.  Consults:  Cardiology request (pt left before obtained)  Significant Diagnostic Studies: CBC    Component Value Date/Time   HGB 16.7 03/30/2015 1055   HCT 49.0 03/30/2015 1055    BMET    Component Value Date/Time   NA 134* 03/30/2015 1055   K 4.8 03/30/2015 1055   CL 101 03/30/2015 1055   GLUCOSE 288* 03/30/2015 1055   BUN 19 03/30/2015 1055   CREATININE 0.80 03/30/2015 1055    COAG No results found for: INR, PROTIME No results found for: PTT  Disposition: 07-Left Against Medical Advice   Signed: Nuri Larmer LIANG-YU 04/03/2015, 3:05 PM

## 2015-04-17 ENCOUNTER — Telehealth: Payer: Self-pay

## 2015-04-17 DIAGNOSIS — I998 Other disorder of circulatory system: Secondary | ICD-10-CM

## 2015-04-17 DIAGNOSIS — I70221 Atherosclerosis of native arteries of extremities with rest pain, right leg: Secondary | ICD-10-CM

## 2015-04-17 DIAGNOSIS — Z01810 Encounter for preprocedural cardiovascular examination: Secondary | ICD-10-CM

## 2015-04-17 DIAGNOSIS — I999 Unspecified disorder of circulatory system: Secondary | ICD-10-CM

## 2015-04-17 DIAGNOSIS — M79671 Pain in right foot: Secondary | ICD-10-CM

## 2015-04-17 NOTE — Telephone Encounter (Signed)
Patient is scheduled for 04/22/2015 @ 2:40 with Dr Diona BrownerMcDowell in [place] office. I have left a message for Mr [name] to call me back to get appointment details

## 2015-04-17 NOTE — Telephone Encounter (Signed)
Phone call to pt. to inquire if he wants to reschedule the necessary pre-operative work-up, and right leg femoral-posterior tibial bypass.  Stated he does want the surgery.  Stated "my foot hurts."  Advised that he has a limb-threatening situation with his PAD.  Advised that Dr. Imogene Burnhen was recommending hospitalizing him prior to surgery to obtain the cardiac evaluation to expedite his care.  Stated "I am claustrophobic in hospitals; I don't like them."  Is requesting to not be put in the hospital until the day of his surgery.  Discussed with Dr. Imogene Burnhen.  Will plan on setting-up for outpatient cardiology appt., for pre-op clearance, and will schedule surgery after the Cardiac clearance is done.  Notified pt. of plan.  Agrees.

## 2015-04-20 NOTE — Telephone Encounter (Signed)
I spoke with Hector Green to make him aware of his appt on 04/22/15 @ [address] [place] office, dpm

## 2015-04-22 ENCOUNTER — Encounter: Payer: Self-pay | Admitting: *Deleted

## 2015-04-22 ENCOUNTER — Encounter: Payer: Self-pay | Admitting: Cardiology

## 2015-04-22 ENCOUNTER — Ambulatory Visit (INDEPENDENT_AMBULATORY_CARE_PROVIDER_SITE_OTHER): Payer: Medicare Other | Admitting: Cardiology

## 2015-04-22 VITALS — BP 149/78 | HR 78 | Ht 69.0 in | Wt 197.0 lb

## 2015-04-22 DIAGNOSIS — E118 Type 2 diabetes mellitus with unspecified complications: Secondary | ICD-10-CM | POA: Diagnosis not present

## 2015-04-22 DIAGNOSIS — Z01818 Encounter for other preprocedural examination: Secondary | ICD-10-CM | POA: Diagnosis not present

## 2015-04-22 DIAGNOSIS — I25810 Atherosclerosis of coronary artery bypass graft(s) without angina pectoris: Secondary | ICD-10-CM | POA: Diagnosis not present

## 2015-04-22 DIAGNOSIS — I739 Peripheral vascular disease, unspecified: Secondary | ICD-10-CM

## 2015-04-22 DIAGNOSIS — I10 Essential (primary) hypertension: Secondary | ICD-10-CM

## 2015-04-22 NOTE — Progress Notes (Signed)
Cardiology Office Note  Date: 04/22/2015   ID: Hector Green, DOB 1961/08/20, MRN [id number]  PCP: None at present  Primary Cardiologist: Nona DellSamuel McDowell, MD   Chief Complaint  Patient presents with  . Preoperative evaluation    History of Present Illness: Hector RavelJohn Wayne Reis is a 54 y.o. male referred for preoperative cardiac evaluation by Dr. Imogene Burnhen. He was recently evaluated by Dr. Imogene Burnhen for right foot pain and paresthesia, having recently moved [place] from [place]. I do not have any specific records about his prior cardiac history. He underwent aortogram with bilateral run off in mid April demonstrating occluded right distal SFA with 1 vessel runoff via the posterior tibial artery. It was recommended that he be hospitalized for right femorotibial bypass, however records indicate that he ultimately left the hospital AMA prior to obtaining preoperative assessment or surgery.  [name] tells me that he has not had any angina symptoms or needed nitroglycerin in the last several years. He was working most recently in [employer]eron[employer], but has stopped related to progressive claudication and right foot pain. He is on a minimal medical regimen as detailed below.  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 He describes multivessel CAD and a "triple bypass" [place]. He has not had regular cardiology follow-up since that time.  He describes NYHA class II dyspnea at baseline, no palpitations or syncope. No orthopnea or PND. No leg edema.   Past Medical History  Diagnosis Date  . CAD (coronary artery disease) 2011    [place]ivessel s/p CAB[place]  . Essential hypertension   . Type 2 diabetes mellitus   . Hyperlipidemia   . Peripheral arterial disease     Past Surgical History  Procedure Laterality Date  . Coronary artery bypass graft  2011    Three vessel by report  .  Abdominal aortagram N/A 03/30/2015    Procedure: ABDOMINAL Ronny FlurryAORTAGRAM;  Surgeon: Fransisco HertzBrian L Chen, MD;  [facility];  Service: Cardiovascular;  Laterality: N/A;  . Lower extremity angiogram N/A 03/30/2015    Procedure: LOWER EXTREMITY ANGIOGRAM;  Surgeon: Fransisco HertzBrian L Chen, MD;  [facility];  Service: Cardiovascular;  Laterality: N/A;    Current Outpatient Prescriptions  Medication Sig Dispense Refill  . aspirin EC 81 MG tablet Take 81 mg by mouth daily.    . metFORMIN (GLUCOPHAGE) 500 MG tablet Take 500 mg by mouth.     No current facility-administered medications for this visit.    Allergies:  Fish-derived products and Penicillins   Social History: The patient  reports that he has been smoking Cigarettes.  He started smoking about 39 years ago. He has a 7.5 pack-year smoking history. He has never used smokeless tobacco. He reports that he does not drink alcohol or use illicit drugs.   Family History: The patient's family history includes Cancer in his father.   ROS:  Please see the history of present illness. Otherwise, complete review of systems is positive for claudication, right worse than left.  All other systems are reviewed and negative.   Physical Exam: VS:  BP 149/78 mmHg  Pulse 78  Ht 5\' 9"  (1.753 m)  Wt 197 lb (89.359 kg)  BMI 29.08 kg/m2, BMI Body mass index is 29.08 kg/(m^2).  Wt Readings from Last 3 Encounters:  04/22/15 197 lb (89.359 kg)  03/27/15 201 lb  11.2 oz (91.491 kg)     General: Patient appears comfortable at rest. HEENT: Conjunctiva and lids normal, oropharynx clear. Neck: Supple, no elevated JVP or carotid bruits, no thyromegaly. Lungs: Clear to auscultation, nonlabored breathing at rest. Thorax: Well-healed sternal incision. Cardiac: Regular rate and rhythm, S4, no significant systolic murmur, no pericardial rub. Abdomen: Soft, nontender, bowel sounds present, no guarding or rebound. Extremities: No pitting edema, distal pulses diminished,  cannot palpate right DP. Skin: Warm and dry. Multiple tattoos. Musculoskeletal: No kyphosis. Neuropsychiatric: Alert and oriented x3, affect grossly appropriate.   ECG: Recent tracing from 03/30/2015 showed sinus rhythm with left axis deviation, incomplete right bundle branch block. No old tracing available.   Recent Labwork: 03/30/2015: BUN 19; Creatinine 0.80; Hemoglobin 16.7; Potassium 4.8; Sodium 134*    Assessment and Plan:  1. Preoperative evaluation in a 54 year old male with history of CAD status post CABG in 2011 at a facility [place]. At this point I do not have any detailed records regarding his previous cardiac evaluation or LVEF, he states that he had a "small heart attack" at that time and has done well. He has no active angina symptoms or CHF symptoms at this time. Recent ECG reviewed above. Plan at this time is to obtain his previous records and proceed with a Lexiscan Cardiolite to assess ischemic burden prior to proceeding with vascular surgery. This will be scheduled to assess ischemic burden and we will inform the patient and surgeon of the results. We will establish cardiology follow-up down the road.  2. Type 2 diabetes mellitus, on metformin. At the present time the patient does not have a primary care provider. We provided him a list of providers in the area so that he can establish care.  3. Essential hypertension, blood pressure mildly elevated today. He is not on any antihypertensives at this time. ACE inhibitor or ARB would be a reasonable choice with diabetes mellitus. As noted above, he ideally needs to establish with a primary care provider to help manage him long-term and make adjustments as needed.  4. Ongoing tobacco abuse. Smoking cessation discussed.  5. Peripheral arterial disease as outlined above. Patient being considered for right femorotibial bypass by Dr. Imogene Burnhen.  Current medicines were reviewed with the patient today.   Orders  Placed This Encounter  Procedures  . NM Myocar Multi W/Spect W/Wall Motion / EF  . Myocardial Perfusion Imaging    Disposition: Call with results. FU with me in 6 months.   Signed, Jonelle SidleSamuel G. McDowell, MD, [facility] 04/22/2015 3:25 PM    [facility] [address], [place], [place] [address] Phone: [phone]; Fax: [phone]

## 2015-04-22 NOTE — Patient Instructions (Signed)
Your physician recommends that you continue on your current medications as directed. Please refer to the Current Medication list given to you today. Your physician has requested that you have a lexiscan myoview. For further information please visit [email or web address]. Please follow instruction sheet, as given. Your physician recommends that you schedule a follow-up appointment in: 6 months. You will receive a reminder letter in the mail in about 4 months reminding you to call and schedule your appointment. If you don't receive this letter, please contact our office. 

## 2015-04-27 ENCOUNTER — Encounter (HOSPITAL_COMMUNITY)
Admission: RE | Admit: 2015-04-27 | Discharge: 2015-04-27 | Disposition: A | Discharge status: Home / Self Care | Payer: Medicare Other | Source: Ambulatory Visit | Attending: Cardiology | Admitting: Cardiology

## 2015-04-27 ENCOUNTER — Encounter (HOSPITAL_COMMUNITY): Payer: Self-pay

## 2015-04-27 ENCOUNTER — Inpatient Hospital Stay (HOSPITAL_COMMUNITY): Admission: RE | Admit: 2015-04-27 | Payer: Medicare Other | Source: Ambulatory Visit

## 2015-04-27 ENCOUNTER — Telehealth: Payer: Self-pay | Admitting: *Deleted

## 2015-04-27 DIAGNOSIS — I25810 Atherosclerosis of coronary artery bypass graft(s) without angina pectoris: Secondary | ICD-10-CM | POA: Insufficient documentation

## 2015-04-27 DIAGNOSIS — Z01818 Encounter for other preprocedural examination: Secondary | ICD-10-CM | POA: Insufficient documentation

## 2015-04-27 LAB — NM MYOCAR MULTI W/SPECT W/WALL MOTION / EF
LHR: 0
LV dias vol: 123 mL
LVSYSVOL: 179 mL
NUC STRESS EF: 31 %
Peak HR: 116 {beats}/min
Rest HR: 76 {beats}/min
SDS: 6
SRS: 21
SSS: 27
TID: 1.22

## 2015-04-27 MED ORDER — REGADENOSON 0.4 MG/5ML IV SOLN
0.4000 mg | Freq: Once | INTRAVENOUS | Status: AC | PRN
  Filled 2015-04-27: qty 5

## 2015-04-27 MED ORDER — TECHNETIUM TC 99M SESTAMIBI GENERIC - CARDIOLITE
10.0000 | Freq: Once | INTRAVENOUS | Status: AC | PRN
  Administered 2015-04-27: 10 via INTRAVENOUS

## 2015-04-27 MED ORDER — SODIUM CHLORIDE 0.9 % IJ SOLN
10.0000 mL | INTRAMUSCULAR | Status: DC | PRN
  Filled 2015-04-27: qty 10

## 2015-04-27 MED ORDER — REGADENOSON 0.4 MG/5ML IV SOLN
INTRAVENOUS | Status: AC
  Administered 2015-04-27: 0.4 mg
  Filled 2015-04-27: qty 5

## 2015-04-27 MED ORDER — SODIUM CHLORIDE 0.9 % IJ SOLN
INTRAMUSCULAR | Status: AC
  Administered 2015-04-27: 10 mL
  Filled 2015-04-27: qty 3

## 2015-04-27 MED ORDER — TECHNETIUM TC 99M SESTAMIBI - CARDIOLITE
30.0000 | Freq: Once | INTRAVENOUS | Status: AC | PRN
  Administered 2015-04-27: 10:00:00 30 via INTRAVENOUS

## 2015-04-27 NOTE — Telephone Encounter (Signed)
Patient informed and scheduled to see Jacolyn ReedyMichele Lenze on Wednesday at the [place] office.

## 2015-04-27 NOTE — Telephone Encounter (Signed)
-----   Message from Jonelle SidleSamuel G McDowell, MD sent at 04/27/2015  1:02 PM EDT ----- I reviewed the report and also the images. Study is consistent with ischemic cardiomyopathy with moderately reduced ejection fraction, large area of inferoposterior scar, and also significant ischemia in the anterolateral wall, TID ratio is 1.22. This is a high risk abnormality, and ideally he should have a cardiac catheterization prior to considering major vascular surgery. I have not yet received any records from his prior cardiac care in [place]. I am forwarding a copy of this to Dr. Imogene Burnhen, the patient's vascular surgeon. This presents somewhat of a difficult situation as it relates to timing of his peripheral vascular surgery and also any potential cardiac revascularization procedures that may need to be considered. Please schedule him to have an office visit this week, either with me or with the extender present during the day that I am in the office.

## 2015-04-29 ENCOUNTER — Ambulatory Visit: Payer: Medicare Other | Admitting: Physician Assistant

## 2015-04-29 ENCOUNTER — Encounter: Payer: Self-pay | Admitting: Physician Assistant

## 2015-04-29 ENCOUNTER — Ambulatory Visit (INDEPENDENT_AMBULATORY_CARE_PROVIDER_SITE_OTHER): Payer: Medicare Other | Admitting: Physician Assistant

## 2015-04-29 ENCOUNTER — Other Ambulatory Visit (HOSPITAL_COMMUNITY)
Admission: RE | Admit: 2015-04-29 | Discharge: 2015-04-29 | Disposition: A | Discharge status: Home / Self Care | Payer: Medicare Other | Source: Ambulatory Visit | Attending: Physician Assistant | Admitting: Physician Assistant

## 2015-04-29 ENCOUNTER — Other Ambulatory Visit: Payer: Self-pay | Admitting: Physician Assistant

## 2015-04-29 ENCOUNTER — Encounter: Payer: Self-pay | Admitting: *Deleted

## 2015-04-29 VITALS — BP 126/78 | HR 109 | Ht 70.0 in | Wt 196.4 lb

## 2015-04-29 DIAGNOSIS — I70229 Atherosclerosis of native arteries of extremities with rest pain, unspecified extremity: Secondary | ICD-10-CM | POA: Diagnosis not present

## 2015-04-29 DIAGNOSIS — I25810 Atherosclerosis of coronary artery bypass graft(s) without angina pectoris: Secondary | ICD-10-CM | POA: Diagnosis not present

## 2015-04-29 DIAGNOSIS — E785 Hyperlipidemia, unspecified: Secondary | ICD-10-CM | POA: Insufficient documentation

## 2015-04-29 DIAGNOSIS — I10 Essential (primary) hypertension: Secondary | ICD-10-CM | POA: Insufficient documentation

## 2015-04-29 DIAGNOSIS — I998 Other disorder of circulatory system: Secondary | ICD-10-CM

## 2015-04-29 DIAGNOSIS — Z720 Tobacco use: Secondary | ICD-10-CM | POA: Diagnosis not present

## 2015-04-29 DIAGNOSIS — Z01818 Encounter for other preprocedural examination: Secondary | ICD-10-CM

## 2015-04-29 LAB — CBC WITH DIFFERENTIAL/PLATELET
BASOS PCT: 0 % (ref 0–1)
Basophils Absolute: 0 10*3/uL (ref 0.0–0.1)
EOS ABS: 0.3 10*3/uL (ref 0.0–0.7)
EOS PCT: 3 % (ref 0–5)
HEMATOCRIT: 47.1 % (ref 39.0–52.0)
Hemoglobin: 16.6 g/dL (ref 13.0–17.0)
Lymphocytes Relative: 27 % (ref 12–46)
Lymphs Abs: 2.6 10*3/uL (ref 0.7–4.0)
MCH: 30.2 pg (ref 26.0–34.0)
MCHC: 35.2 g/dL (ref 30.0–36.0)
MCV: 85.6 fL (ref 78.0–100.0)
MONO ABS: 0.7 10*3/uL (ref 0.1–1.0)
MONOS PCT: 7 % (ref 3–12)
Neutro Abs: 6 10*3/uL (ref 1.7–7.7)
Neutrophils Relative %: 63 % (ref 43–77)
Platelets: 160 10*3/uL (ref 150–400)
RBC: 5.5 MIL/uL (ref 4.22–5.81)
RDW: 12.7 % (ref 11.5–15.5)
WBC: 9.5 10*3/uL (ref 4.0–10.5)

## 2015-04-29 LAB — PROTIME-INR
INR: 0.87 (ref <1.50)
PROTHROMBIN TIME: 11.9 s (ref 11.6–15.2)

## 2015-04-29 LAB — APTT: aPTT: 25 seconds (ref 24–37)

## 2015-04-29 MED ORDER — METOPROLOL TARTRATE 25 MG PO TABS: 12.5000 mg | ORAL_TABLET | Freq: Every day | ORAL | Status: DC

## 2015-04-29 MED ORDER — ATORVASTATIN CALCIUM 80 MG PO TABS: 80.0000 mg | ORAL_TABLET | Freq: Every day | ORAL | Status: DC

## 2015-04-29 NOTE — Assessment & Plan Note (Signed)
Patient has history of hyperlipidemia that been untreated for many years. We'll begin Lipitor 80 mg once daily. We'll check fasting lipid profile.

## 2015-04-29 NOTE — Assessment & Plan Note (Signed)
Patient smokes 7 cigarettes a day which is down from 2 packs per day. He's very anxious and complains of leg pain which causes him to smoke. Smoking cessation discussed.

## 2015-04-29 NOTE — Assessment & Plan Note (Signed)
Awaiting surgery by Dr. Imogene Burnhen until after heart catheterization

## 2015-04-29 NOTE — Patient Instructions (Addendum)
Your physician recommends that you schedule a follow-up appointment in: After Cath  Your physician has recommended you make the following change in your medication:    Start Metoprolol 12.5 mg Daily Start Lipitor 80 mg Daily  Your physician has requested that you have a cardiac catheterization. Cardiac catheterization is used to diagnose and/or treat various heart conditions. Doctors may recommend this procedure for a number of different reasons. The most common reason is to evaluate chest pain. Chest pain can be a symptom of coronary artery disease (CAD), and cardiac catheterization can show whether plaque is narrowing or blocking your heart's arteries. This procedure is also used to evaluate the valves, as well as measure the blood flow and oxygen levels in different parts of your heart. For further information please visit [email or web address]. Please follow instruction sheet, as given.   Thank you for choosing [facility]!

## 2015-04-29 NOTE — Assessment & Plan Note (Signed)
Patient has history of hypertension that's been untreated for years. His blood pressure is normal today. Begin low-dose metoprolol 12.5 mg daily. His heart rate is 109 today.

## 2015-04-29 NOTE — Assessment & Plan Note (Signed)
Patient had CABG 3 in 2011 in [place]. Would not have the details of the cath. He underwent a stress Myoview on 04/27/15 before undergoing major vascular surgery. This stress test was read as high risk with a large area of inferior and inferolateral infarct as well as a large area of ischemia in the anterolateral wall EF 30-44%. Dr. Diona BrownerMcDowell recommends left heart catheterization, grafts and possible PCI. He discussed this in detail with Dr. Imogene Burnhen who concurs and says he could possibly do the surgery on aspirin and Plavix if necessary. Risk and benefits including bleeding, MI, and CVA described to patient who is agreeable to proceed. We'll start Lipitor 80 mg once daily, metoprolol 12.5 mg once daily.

## 2015-04-29 NOTE — Progress Notes (Signed)
Cardiology Office Note   Date:  04/29/2015   ID:  Hector Green, DOB [birth year], MRN [id number]  PCP:  No PCP Per Patient  Cardiologist: Dr. Diona BrownerMcDowell  Chief Complaint: Right leg pain and foot pain    History of Present Illness: Hector RavelJohn Wayne Lannom is a 54 y.o. male who presents for follow-up of abnormal stress test. He was seen by Dr. Diona BrownerMcDowell on 04/22/15 for preop evaluation before undergoing surgery for right foot pain and paresthesia. He was seen by Dr. Imogene Burnhen and felt to have right lower leg critical limb ischemia with left lower leg intermittent claudication likely bilateral distal SFA/proximal popliteal occlusions. He underwent aortogram with bilateral run off in mid April demonstrating occluded right distal SFA with 1 vessel runoff via the posterior tibial artery. It was recommended that he be hospitalized for right femorotibial bypass, however records indicate that he ultimately left the hospital AMA prior to obtaining preoperative assessment or surgery.  He states he had a small heart attack in 2011 and underwent CABG in [place]. Stress Myoview 04/27/15 were consistent with ischemia and prior MI. It was considered a high risk study due to low ejection fraction large area of ischemia. EF was moderately decreased at 30-44%. There is a large inferior and inferior lateral infarct, large area of ischemia in the anterolateral wall. Dr. Diona BrownerMcDowell recommends have a cardiac catheterization prior to considering major vascular surgery. Dr. Diona BrownerMcDowell spoke with Dr. Imogene Burnhen about doing a heart catheterization with possible PCI and he is agreeable to perform the surgery on aspirin and Plavix if necessary.  Patient denies any chest pain, palpitations, dyspnea, dyspnea on exertion, dizziness or presyncope. He can only walk from the examining room to the waiting room before he has to sit down because of severe leg pain. He continues to smoke about 7 cigarettes a day but has cut back from 2 packs per  day. Left heart catheterization with possible PCI was discussed with patient who is agreeable.  Past Medical History  Diagnosis Date  . CAD (coronary artery disease) 2011    Multivessel s/p CAB[place]  . Essential hypertension   . Type 2 diabetes mellitus   . Hyperlipidemia   . Peripheral arterial disease     Past Surgical History  Procedure Laterality Date  . Coronary artery bypass graft  2011    Three vessel by report  . Abdominal aortagram N/A 03/30/2015    Procedure: ABDOMINAL Ronny FlurryAORTAGRAM;  Surgeon: Fransisco HertzBrian L Chen, MD;  [facility];  Service: Cardiovascular;  Laterality: N/A;  . Lower extremity angiogram N/A 03/30/2015    Procedure: LOWER EXTREMITY ANGIOGRAM;  Surgeon: Fransisco HertzBrian L Chen, MD;  [facility];  Service: Cardiovascular;  Laterality: N/A;     Current Outpatient Prescriptions  Medication Sig Dispense Refill  . aspirin EC 81 MG tablet Take 81 mg by mouth daily.    . metFORMIN (GLUCOPHAGE) 500 MG tablet Take 500 mg by mouth.     No current facility-administered medications for this visit.   Facility-Administered Medications Ordered in Other Visits  Medication Dose Route Frequency Provider Last Rate Last Dose  . sodium chloride 0.9 % injection 10 mL  10 mL Intravenous PRN Jodelle GrossKathryn M Lawrence, NP   0 mL at 04/27/15 1003    Allergies:   Fish-derived products and Penicillins    Social History:  The patient  reports that he has been smoking Cigarettes.  He started smoking about 39 years ago. He has a 7.5  pack-year smoking history. He has never used smokeless tobacco. He reports that he does not drink alcohol or use illicit drugs.   Family History:  The patient's    family history includes Cancer in his father.    ROS:  Please see the history of present illness.   Otherwise, review of systems are positive for anxiety, severe pain in the right leg when walking.   All other systems are reviewed and negative.    PHYSICAL EXAM: VS:  BP 126/78 mmHg   Pulse 109  Ht 5\' 10"  (1.778 m)  Wt 196 lb 6.4 oz (89.086 kg)  BMI 28.18 kg/m2  SpO2 98% , BMI Body mass index is 28.18 kg/(m^2). GEN: Well nourished, well developed, in no acute distress Neck: no JVD, HJR, carotid bruits, or masses Cardiac: RRR; no murmurs,gallop, rubs, thrill or heave,  Respiratory:  clear to auscultation bilaterally, normal work of breathing GI: soft, nontender, nondistended, + BS MS: no deformity or atrophy Extremities: without cyanosis, clubbing, edema, nonpalpable distal pulses bilaterally.  Skin: warm and dry, no rash Neuro:  Strength and sensation are intact    EKG:  EKG is not ordered today.    Recent Labs: 03/30/2015: BUN 19; Creatinine 0.80; Hemoglobin 16.7; Potassium 4.8; Sodium 134*    Lipid Panel No results found for: CHOL, TRIG, HDL, CHOLHDL, VLDL, LDLCALC, LDLDIRECT    Wt Readings from Last 3 Encounters:  04/29/15 196 lb 6.4 oz (89.086 kg)  04/22/15 197 lb (89.359 kg)  03/27/15 201 lb 11.2 oz (91.491 kg)      Other studies Reviewed: Additional studies/ records that were reviewed today include and review of the records demonstrates:   Stress myoview 04/27/15:   There was no ST segment deviation noted during stress.  Findings consistent with ischemia and prior myocardial infarction.  This is a high risk study. High risk due to low ejection fraction and large area of ischemia.  The left ventricular ejection fraction is moderately decreased (30-44%).  There is a large inferior and inferolateral infarct  There is a large area of iscehmia in the anterolateral wall  There is borderline elevation of TID ratio (1.22)     ASSESSMENT AND PLAN: CAD (coronary artery disease) of artery bypass graft Patient had CABG 3 in 2011 in [place]. Would not have the details of the cath. He underwent a stress Myoview on 04/27/15 before undergoing major vascular surgery. This stress test was read as high risk with a large area of inferior  and inferolateral infarct as well as a large area of ischemia in the anterolateral wall EF 30-44%. Dr. Diona BrownerMcDowell recommends left heart catheterization, grafts and possible PCI. He discussed this in detail with Dr. Imogene Burnhen who concurs and says he could possibly do the surgery on aspirin and Plavix if necessary. Risk and benefits including bleeding, MI, and CVA described to patient who is agreeable to proceed. We'll start Lipitor 80 mg once daily, metoprolol 12.5 mg once daily.   Hyperlipidemia Patient has history of hyperlipidemia that been untreated for many years. We'll begin Lipitor 80 mg once daily. We'll check fasting lipid profile.   Essential hypertension Patient has history of hypertension that's been untreated for years. His blood pressure is normal today. Begin low-dose metoprolol 12.5 mg daily. His heart rate is 109 today.   Tobacco abuse Patient smokes 7 cigarettes a day which is down from 2 packs per day. He's very anxious and complains of leg pain which causes him to smoke. Smoking cessation discussed.  Critical lower limb ischemia Awaiting surgery by Dr. Imogene Burn until after heart catheterization      Signed, Jacolyn Reedy, PA-C  04/29/2015 2:01 PM    [facility] [address] [place], [place], [place]  [address] Phone: [phone]; Fax: [phone]

## 2015-04-30 ENCOUNTER — Encounter: Payer: Self-pay | Admitting: *Deleted

## 2015-04-30 LAB — COMPREHENSIVE METABOLIC PANEL
ALBUMIN: 4 g/dL (ref 3.5–5.2)
ALT: 13 U/L (ref 0–53)
AST: 15 U/L (ref 0–37)
Alkaline Phosphatase: 65 U/L (ref 39–117)
BUN: 20 mg/dL (ref 6–23)
CALCIUM: 9.3 mg/dL (ref 8.4–10.5)
CO2: 22 meq/L (ref 19–32)
CREATININE: 0.96 mg/dL (ref 0.50–1.35)
Chloride: 99 mEq/L (ref 96–112)
Glucose, Bld: 440 mg/dL — ABNORMAL HIGH (ref 70–99)
POTASSIUM: 4.2 meq/L (ref 3.5–5.3)
SODIUM: 132 meq/L — AB (ref 135–145)
TOTAL PROTEIN: 7.2 g/dL (ref 6.0–8.3)
Total Bilirubin: 0.4 mg/dL (ref 0.3–1.2)

## 2015-04-30 LAB — LIPID PANEL
CHOLESTEROL: 270 mg/dL — AB (ref 0–200)
HDL: 40 mg/dL (ref >=40)
LDL CALC: 153 mg/dL — AB (ref 0–99)
Total CHOL/HDL Ratio: 6.8 Ratio
Triglycerides: 386 mg/dL — ABNORMAL HIGH (ref <150)
VLDL: 77 mg/dL — AB (ref 0–40)

## 2015-04-30 NOTE — Progress Notes (Signed)
Case discussed with Ms. Hector Public PA-C. I just met Hector Green on May 11 for preoperative consultation per Hector Green. He has severe claudication with recently documented occluded right distal SFA with 1 vessel runoff via the posterior tibial artery, and Hector Green has recommended a right femorotibial bypass. At this point we have still not received any records regarding prior cardiac evaluation in [place]. He reports a heart attack in 2011 and ultimately "triple bypass" surgery. He has not maintained any cardiology follow-up since that time and just recently moved to this area. I referred him for a preoperative Cardiolite study to assess ischemic burden. Study was overall high risk with large regions of scar in the inferior and inferolateral walls, large area of ischemia in the anterolateral wall, LVEF 31%, and TID ratio of 1.22. He was brought back into the office to discuss proceeding with cardiac catheterization prior to vascular surgery, and I also communicated with Hector Green via EPIC to discuss the situation. He agrees that cardiology evaluation needs to occur before considering peripherally revascularization surgery, and does indicate that surgery could be done on aspirin and Plavix if the patient were to undergo stent placement. He did voice concern however about using other agents instead of Plavix such as Effient or Brilinta given excessive bleeding risk with surgery. Patient is being scheduled for diagnostic cardiac catheterization with Dr. Angelena Form. Would recommend communication between the interventional cardiologist and Hector Green regarding plan for coronary revascularization and potential impact on peripheral vascular surgery as it relates to timing.  Satira Sark, M.D., F.A.C.C.

## 2015-05-04 ENCOUNTER — Telehealth: Payer: Self-pay

## 2015-05-04 DIAGNOSIS — E785 Hyperlipidemia, unspecified: Secondary | ICD-10-CM

## 2015-05-04 NOTE — Telephone Encounter (Signed)
Pt made aware of labs,mailed lab slip

## 2015-05-04 NOTE — Telephone Encounter (Signed)
Phone call from pt.  Reported excruciating pain in right foot.  Requested pain medication.  Stated my foot burns and hurts all the time.  Reported he has Cardiac Cath. scheduled for 05/06/15.  Advised we will not prescribe pain medication, unless evaluated in the office, as last OV was 03/27/15.  Verb. understanding.  Appt. given for 05/08/15 @ 9:45 AM.

## 2015-05-04 NOTE — Telephone Encounter (Signed)
-----   Message from Dyann KiefMichele M Lenze, PA-C sent at 05/04/2015  7:52 AM EDT ----- precath labs ok. Cholesterol very high. Continue lipitor. Recheck fasting lipid panel and lft's in 6 wks.  Glucose also very high.

## 2015-05-05 ENCOUNTER — Telehealth: Payer: Self-pay | Admitting: Vascular Surgery

## 2015-05-05 NOTE — Telephone Encounter (Signed)
-----   Message from Phillips Odorarol S Pullins, RN sent at 05/04/2015  5:17 PM EDT ----- Regarding: appt. 05/08/15 Please schedule for appt. with Dr. Imogene Burnhen on Friday, 05/08/15 @ 9:45 AM; c/o excruciating pain in right foot; has known PAD with Right LE Critical Limb Ischemia; needs to discuss surgery; has cardiac cath sched. on 5/25.  (I advised pt. to check in on 5/27 @ 9:30 AM)

## 2015-05-06 ENCOUNTER — Other Ambulatory Visit: Payer: Self-pay | Admitting: *Deleted

## 2015-05-06 ENCOUNTER — Encounter: Payer: Self-pay | Admitting: Vascular Surgery

## 2015-05-06 ENCOUNTER — Encounter (HOSPITAL_COMMUNITY)
Admission: RE | Disposition: A | Discharge status: Home / Self Care | Payer: Medicare Other | Source: Ambulatory Visit | Attending: Cardiovascular Disease

## 2015-05-06 ENCOUNTER — Ambulatory Visit (HOSPITAL_COMMUNITY)
Admission: RE | Admit: 2015-05-06 | Discharge: 2015-05-06 | Disposition: A | Discharge status: Home / Self Care | Payer: Medicare Other | Source: Ambulatory Visit | Attending: Cardiovascular Disease | Admitting: Cardiovascular Disease

## 2015-05-06 ENCOUNTER — Encounter (HOSPITAL_COMMUNITY): Payer: Self-pay | Admitting: Cardiovascular Disease

## 2015-05-06 DIAGNOSIS — R9439 Abnormal result of other cardiovascular function study: Secondary | ICD-10-CM

## 2015-05-06 DIAGNOSIS — I25810 Atherosclerosis of coronary artery bypass graft(s) without angina pectoris: Secondary | ICD-10-CM | POA: Diagnosis not present

## 2015-05-06 DIAGNOSIS — I2582 Chronic total occlusion of coronary artery: Secondary | ICD-10-CM | POA: Diagnosis not present

## 2015-05-06 DIAGNOSIS — Z01810 Encounter for preprocedural cardiovascular examination: Secondary | ICD-10-CM

## 2015-05-06 DIAGNOSIS — I2510 Atherosclerotic heart disease of native coronary artery without angina pectoris: Secondary | ICD-10-CM | POA: Insufficient documentation

## 2015-05-06 HISTORY — PX: CARDIAC CATHETERIZATION: SHX172

## 2015-05-06 HISTORY — DX: Abnormal result of other cardiovascular function study: R94.39

## 2015-05-06 LAB — GLUCOSE, CAPILLARY: GLUCOSE-CAPILLARY: 218 mg/dL — AB (ref 65–99)

## 2015-05-06 SURGERY — LEFT HEART CATH AND CORS/GRAFTS ANGIOGRAPHY
Anesthesia: LOCAL

## 2015-05-06 MED ORDER — VERAPAMIL HCL 2.5 MG/ML IV SOLN
INTRAVENOUS | Status: AC
  Filled 2015-05-06: qty 2

## 2015-05-06 MED ORDER — ONDANSETRON HCL 4 MG/2ML IJ SOLN: 4.0000 mg | Freq: Four times a day (QID) | INTRAMUSCULAR | Status: DC | PRN

## 2015-05-06 MED ORDER — HEPARIN (PORCINE) IN NACL 2-0.9 UNIT/ML-% IJ SOLN
INTRAMUSCULAR | Status: AC
  Filled 2015-05-06: qty 1000

## 2015-05-06 MED ORDER — HEPARIN SODIUM (PORCINE) 1000 UNIT/ML IJ SOLN
INTRAMUSCULAR | Status: AC
  Filled 2015-05-06: qty 1

## 2015-05-06 MED ORDER — LIDOCAINE HCL (PF) 1 % IJ SOLN
INTRAMUSCULAR | Status: AC
  Filled 2015-05-06: qty 30

## 2015-05-06 MED ORDER — VERAPAMIL HCL 2.5 MG/ML IV SOLN
INTRAVENOUS | Status: DC | PRN
  Administered 2015-05-06: 12:00:00 via INTRA_ARTERIAL

## 2015-05-06 MED ORDER — SODIUM CHLORIDE 0.9 % IJ SOLN: 3.0000 mL | INTRAMUSCULAR | Status: DC | PRN

## 2015-05-06 MED ORDER — SODIUM CHLORIDE 0.9 % IV SOLN: INTRAVENOUS | Status: DC

## 2015-05-06 MED ORDER — ACETAMINOPHEN 325 MG PO TABS: 650.0000 mg | ORAL_TABLET | ORAL | Status: DC | PRN

## 2015-05-06 MED ORDER — ASPIRIN 81 MG PO CHEW
81.0000 mg | CHEWABLE_TABLET | Freq: Once | ORAL | Status: AC
  Administered 2015-05-06: 81 mg via ORAL

## 2015-05-06 MED ORDER — SODIUM CHLORIDE 0.9 % IV SOLN: 250.0000 mL | INTRAVENOUS | Status: DC | PRN

## 2015-05-06 MED ORDER — IOHEXOL 350 MG/ML SOLN
INTRAVENOUS | Status: DC | PRN
  Administered 2015-05-06: 85 mL via INTRA_ARTERIAL

## 2015-05-06 MED ORDER — ASPIRIN 81 MG PO CHEW
CHEWABLE_TABLET | ORAL | Status: AC
  Administered 2015-05-06: 81 mg via ORAL
  Filled 2015-05-06: qty 1

## 2015-05-06 MED ORDER — HEPARIN SODIUM (PORCINE) 1000 UNIT/ML IJ SOLN
INTRAMUSCULAR | Status: DC | PRN
  Administered 2015-05-06: 5000 [IU] via INTRAVENOUS

## 2015-05-06 MED ORDER — SODIUM CHLORIDE 0.9 % IJ SOLN: 3.0000 mL | Freq: Two times a day (BID) | INTRAMUSCULAR | Status: DC

## 2015-05-06 MED ORDER — FENTANYL CITRATE (PF) 100 MCG/2ML IJ SOLN
INTRAMUSCULAR | Status: AC
  Filled 2015-05-06: qty 2

## 2015-05-06 MED ORDER — SODIUM CHLORIDE 0.9 % IV SOLN
INTRAVENOUS | Status: DC
  Administered 2015-05-06: 10:00:00 via INTRAVENOUS

## 2015-05-06 MED ORDER — FENTANYL CITRATE (PF) 100 MCG/2ML IJ SOLN
INTRAMUSCULAR | Status: DC | PRN
  Administered 2015-05-06: 50 ug via INTRAVENOUS

## 2015-05-06 MED ORDER — MIDAZOLAM HCL 2 MG/2ML IJ SOLN
INTRAMUSCULAR | Status: AC
  Filled 2015-05-06: qty 2

## 2015-05-06 MED ORDER — MIDAZOLAM HCL 2 MG/2ML IJ SOLN
INTRAMUSCULAR | Status: DC | PRN
  Administered 2015-05-06: 2 mg via INTRAVENOUS

## 2015-05-06 SURGICAL SUPPLY — 17 items
CATH INFINITI 5 FR AL2 (CATHETERS) ×2 IMPLANT
CATH INFINITI 5 FR IM (CATHETERS) ×2 IMPLANT
CATH INFINITI 5 FR JL3.5 (CATHETERS) IMPLANT
CATH INFINITI 5 FR MPA2 (CATHETERS) ×2 IMPLANT
CATH INFINITI 5FR ANG PIGTAIL (CATHETERS) IMPLANT
CATH INFINITI 5FR MULTPACK ANG (CATHETERS) ×2 IMPLANT
CATH INFINITI JR4 5F (CATHETERS) IMPLANT
DEVICE RAD COMP TR BAND LRG (VASCULAR PRODUCTS) ×2 IMPLANT
GLIDESHEATH SLEND SS 6F .021 (SHEATH) ×2 IMPLANT
KIT HEART LEFT (KITS) ×2 IMPLANT
PACK CARDIAC CATHETERIZATION (CUSTOM PROCEDURE TRAY) ×2 IMPLANT
SHEATH PINNACLE 5F 10CM (SHEATH) IMPLANT
SYR MEDRAD MARK V 150ML (SYRINGE) ×2 IMPLANT
TRANSDUCER W/STOPCOCK (MISCELLANEOUS) ×2 IMPLANT
TUBING CIL FLEX 10 FLL-RA (TUBING) ×2 IMPLANT
WIRE EMERALD 3MM-J .035X150CM (WIRE) IMPLANT
WIRE SAFE-T 1.5MM-J .035X260CM (WIRE) ×2 IMPLANT

## 2015-05-06 NOTE — Discharge Instructions (Signed)
Radial Site Care °Refer to this sheet in the next few weeks. These instructions provide you with information on caring for yourself after your procedure. Your caregiver may also give you more specific instructions. Your treatment has been planned according to current medical practices, but problems sometimes occur. Call your caregiver if you have any problems or questions after your procedure. °HOME CARE INSTRUCTIONS °· You may shower the day after the procedure. Remove the bandage (dressing) and gently wash the site with plain soap and water. Gently pat the site dry. °· Do not apply powder or lotion to the site. °· Do not submerge the affected site in water for 3 to 5 days. °· Inspect the site at least twice daily. °· Do not flex or bend the affected arm for 24 hours. °· No lifting over 5 pounds (2.3 kg) for 5 days after your procedure. °· Do not drive home if you are discharged the same day of the procedure. Have someone else drive you. °· You may drive 24 hours after the procedure unless otherwise instructed by your caregiver. °· Do not operate machinery or power tools for 24 hours. °· A responsible adult should be with you for the first 24 hours after you arrive home. °What to expect: °· Any bruising will usually fade within 1 to 2 weeks. °· Blood that collects in the tissue (hematoma) may be painful to the touch. It should usually decrease in size and tenderness within 1 to 2 weeks. °SEEK IMMEDIATE MEDICAL CARE IF: °· You have unusual pain at the radial site. °· You have redness, warmth, swelling, or pain at the radial site. °· You have drainage (other than a small amount of blood on the dressing). °· You have chills. °· You have a fever or persistent symptoms for more than 72 hours. °· You have a fever and your symptoms suddenly get worse. °· Your arm becomes pale, cool, tingly, or numb. °· You have heavy bleeding from the site. Hold pressure on the site and call 911. °Document Released: 12/31/2010 Document  Revised: 02/20/2012 Document Reviewed: 12/31/2010 °ExitCare® Patient Information ©2015 [employer]. This information is not intended to replace advice given to you by your health care provider. Make sure you discuss any questions you have with your health care provider. ° °

## 2015-05-07 MED FILL — Lidocaine HCl Local Preservative Free (PF) Inj 1%: INTRAMUSCULAR | Qty: 30 | Status: AC

## 2015-05-07 MED FILL — Heparin Sodium (Porcine) 2 Unit/ML in Sodium Chloride 0.9%: INTRAMUSCULAR | Qty: 1000 | Status: AC

## 2015-05-08 ENCOUNTER — Ambulatory Visit (HOSPITAL_COMMUNITY)
Admission: RE | Admit: 2015-05-08 | Discharge: 2015-05-08 | Disposition: A | Discharge status: Home / Self Care | Payer: Medicare Other | Source: Ambulatory Visit | Attending: Vascular Surgery | Admitting: Vascular Surgery

## 2015-05-08 ENCOUNTER — Telehealth: Payer: Self-pay | Admitting: Vascular Surgery

## 2015-05-08 ENCOUNTER — Encounter: Payer: Self-pay | Admitting: Vascular Surgery

## 2015-05-08 ENCOUNTER — Ambulatory Visit (INDEPENDENT_AMBULATORY_CARE_PROVIDER_SITE_OTHER): Payer: Medicare Other | Admitting: Vascular Surgery

## 2015-05-08 ENCOUNTER — Other Ambulatory Visit: Payer: Self-pay

## 2015-05-08 VITALS — BP 120/66 | HR 76 | Temp 97.9°F | Resp 16 | Ht 69.5 in | Wt 189.0 lb

## 2015-05-08 DIAGNOSIS — Z01810 Encounter for preprocedural cardiovascular examination: Secondary | ICD-10-CM | POA: Insufficient documentation

## 2015-05-08 DIAGNOSIS — E118 Type 2 diabetes mellitus with unspecified complications: Secondary | ICD-10-CM | POA: Diagnosis not present

## 2015-05-08 DIAGNOSIS — I70229 Atherosclerosis of native arteries of extremities with rest pain, unspecified extremity: Secondary | ICD-10-CM | POA: Diagnosis not present

## 2015-05-08 DIAGNOSIS — E1165 Type 2 diabetes mellitus with hyperglycemia: Secondary | ICD-10-CM

## 2015-05-08 DIAGNOSIS — I25810 Atherosclerosis of coronary artery bypass graft(s) without angina pectoris: Secondary | ICD-10-CM | POA: Diagnosis not present

## 2015-05-08 DIAGNOSIS — IMO0002 Reserved for concepts with insufficient information to code with codable children: Secondary | ICD-10-CM | POA: Insufficient documentation

## 2015-05-08 DIAGNOSIS — I998 Other disorder of circulatory system: Secondary | ICD-10-CM

## 2015-05-08 MED ORDER — OXYCODONE-ACETAMINOPHEN 5-325 MG PO TABS: 1.0000 | ORAL_TABLET | ORAL | Status: DC | PRN

## 2015-05-08 NOTE — Telephone Encounter (Signed)
-----   Message from Sharee PimpleMarilyn K McChesney, RN sent at 05/06/2015  2:58 PM EDT ----- Regarding: Schedule   ----- Message -----    From: Fransisco HertzBrian L Chen, MD    Sent: 05/06/2015   1:25 PM      To: [facility] [address]  Hector Green [id number] 1961-04-16  Pt needs BLE Vein mapping for his appt this Friday.

## 2015-05-08 NOTE — Progress Notes (Signed)
ESTABLISHED CRITICAL LIMB ISCHEMIA  History of Present Illness  Hector Green is a 54 y.o. ([birth year]) male with uncontrolled diabetes who presents with chief complaint: severe right foot pain.  This patient was previously evaluated for >2 months of anesthesia in R lower leg and severe RLE rest pain.  His angiogram demonstrates extensive disease with anatomy amendable to a R Fem-PT bypass.  I admitted the patient post-angiogram for medical work-up and optimization.  He left AMA and was lost to follow despite attempts by my staff to track him down.  When he reappeared, he was sent for cardiac evaluation.  His cardiac evaluation demonstrated 30-44% EF and evidence of ischemia.  He recently underwent cardiac catheterization which demonstrated patent CABG bypass grafts.  At this point, the patient is ready to proceed with his bypass.  To date, he has NOT stopped smoking and has NOT start the insulin therapy previously recommended.  Past Medical History  Diagnosis Date  . CAD (coronary artery disease) 2011    Multivessel s/p CABG in [place]  . Essential hypertension   . Type 2 diabetes mellitus   . Hyperlipidemia   . Peripheral arterial disease   . Abnormal nuclear stress test 05/06/2015    Past Surgical History  Procedure Laterality Date  . Coronary artery bypass graft  2011    Three vessel by report  . Abdominal aortagram N/A 03/30/2015    Procedure: ABDOMINAL [name];  Surgeon: Fransisco Hertz, MD;  [facility] C[facility] LAB;  Service: Cardiovascular;  Laterality: N/A;  . Lower extremity angiogram N/A 03/30/2015    Procedure: LOWER EXTREMITY ANGIOGRAM;  Surgeon: Fransisco Hertz, MD;  Location: [facility] CATH LAB;  Service: Cardiovascular;  Laterality: N/A;  . Cardiac catheterization N/A 05/06/2015    Procedure: Left Heart Cath and Cors/Grafts Angiography;  Surgeon: Tonny Bollman, MD;  Location: [facility] INVASIVE CV LAB;  Service: Cardiovascular;  Laterality: N/A;    History   Social History  .  Marital Status: Single    Spouse Name: N/A  . Number of Children: N/A  . Years of Education: N/A   Occupational History  . Not on file.   Social History Main Topics  . Smoking status: Light Tobacco Smoker -- 0.50 packs/day for 15 years    Types: Cigarettes    Start date: 08/24/1975  . Smokeless tobacco: Never Used  . Alcohol Use: No  . Drug Use: No  . Sexual Activity: Not on file   Other Topics Concern  . Not on file   Social History Narrative    Family History  Problem Relation Age of Onset  . Cancer Father     Current Outpatient Prescriptions  Medication Sig Dispense Refill  . aspirin EC 81 MG tablet Take 81 mg by mouth daily.    Marland Kitchen atorvastatin (LIPITOR) 80 MG tablet Take 1 tablet (80 mg total) by mouth daily. 90 tablet 3  . metFORMIN (GLUCOPHAGE) 1000 MG tablet Take by mouth daily.   0  . metoprolol tartrate (LOPRESSOR) 25 MG tablet Take 0.5 tablets (12.5 mg total) by mouth daily. 45 tablet 3  . metFORMIN (GLUCOPHAGE) 500 MG tablet Take 500 mg by mouth 2 (two) times daily with a meal.     . oxyCODONE-acetaminophen (PERCOCET/ROXICET) 5-325 MG per tablet Take 1 tablet by mouth every 4 (four) hours as needed for severe pain. 30 tablet 0   No current facility-administered medications for this visit.     Allergies  Allergen Reactions  . Fish-Derived  Products Anaphylaxis  . Penicillins Anaphylaxis    REVIEW OF SYSTEMS:  (Positives checked otherwise negative)  CARDIOVASCULAR:  []  chest pain, []  chest pressure, []  palpitations, []  shortness of breath when laying flat, [x]  shortness of breath with exertion,  [x]  pain in feet when walking, [x]  pain in feet when laying flat, []  history of blood clot in veins (DVT), []  history of phlebitis, []  swelling in legs, []  varicose veins  PULMONARY:  []  productive cough, []  asthma, []  wheezing  NEUROLOGIC:  [x]  weakness in arms or legs, []  numbness in arms or legs, [x]  difficulty speaking or slurred speech, []  temporary loss of  vision in one eye, []  dizziness  HEMATOLOGIC:  []  bleeding problems, []  problems with blood clotting too easily  MUSCULOSKEL:  []  joint pain, []  joint swelling  GASTROINTEST:  []  vomiting blood, []  blood in stool     GENITOURINARY:  []  burning with urination, []  blood in urine  PSYCHIATRIC:  []  history of major depression  INTEGUMENTARY:  []  rashes, []  ulcers  CONSTITUTIONAL:  []  fever, []  chills  For VQI Use Only  PRE-ADM LIVING: Home  AMB STATUS: Ambulatory  CAD Sx: History of MI, but no symptoms No MI within 6 months  PRIOR CHF: None  STRESS TEST: [ ]  No, [ ]  Normal, [ ]  + ischemia, [ ]  + MI, [x]  Both   Physical Examination  Filed Vitals:   05/08/15 0944  BP: 120/66  Pulse: 76  Temp: 97.9 F (36.6 C)  TempSrc: Oral  Resp: 16  Height: 5' 9.5" (1.765 m)  Weight: 189 lb (85.73 kg)  SpO2: 99%    Body mass index is 27.52 kg/(m^2).  General: A&O x 3, WDWN  Head: [place]/AT  Ear/Nose/Throat: Hearing grossly intact, nares w/o erythema or drainage, oropharynx w/o Erythema/Exudate, Mallampati score: 3  Eyes: PERRLA, EOMI  Neck: Supple, no nuchal rigidity, no palpable LAD  Pulmonary: Sym exp, good air movt, CTAB, no rales, rhonchi, & wheezing  Cardiac: RRR, Nl S1, S2, no Murmurs, rubs or gallops  Vascular: Vessel Right Left  Radial Palpable Palpable  Brachial Palpable Palpable  Carotid Palpable, without bruit Palpable, without bruit  Aorta Not palpable N/A  Femoral Palpable Palpable  Popliteal Not palpable Not palpable  PT Not  Palpable Not Palpable  DP Not Palpable Not Palpable   Gastrointestinal: soft, NTND, -G/R, - HSM, - masses, - CVAT B  Musculoskeletal: M/S 5/5 throughout , Extremities without ischemic changes   Neurologic: CN 2-12 intact , Pain and light touch intact in extremities , Motor exam as listed above  Psychiatric: Judgment intact, Mood & affect appropriate for pt's clinical situation  Dermatologic: See M/S exam for extremity exam, no  rashes otherwise noted  Lymph : No Cervical, Axillary, or Inguinal lymphadenopathy    Non-Invasive Vascular Imaging   BLE GSV Mapping (Date: 05/08/2015)  RLE: inadequate conduit  LLE: adequate conduit   Nuclear stressing testing (04/27/15)  There was no ST segment deviation noted during stress.  Findings consistent with ischemia and prior myocardial infarction.  This is a high risk study. High risk due to low ejection fraction and large area of ischemia.  The left ventricular ejection fraction is moderately decreased (30-44%).  There is a large inferior and inferolateral infarct  There is a large area of iscehmia in the anterolateral wall  There is borderline elevation of TID ratio (1.22)  Cardiac catheterization (05/06/15) 1. Severe native three-vessel coronary artery disease with total occlusion of the proximal RCA, total occlusion  of the proximal left circumflex, and total occlusion of the LAD after the first septal perforator 2. Status post aortocoronary bypass surgery with continued patency of the LIMA to LAD, saphenous vein graft to intermediate, and 7 his vein graft to right PDA 3. Total occlusion of the AV groove circumflex collateralized from the saphenous vein graft to intermediate 4. Moderately severe segmental left ventricular systolic dysfunction with LVEF estimated at 35%  Discussion: The patient has continued patency of his bypass grafts. He may have ischemia in the distribution of the second and third obtuse marginal branches which are collateralized. Otherwise, he has what appears to be normal perfusion to the LAD, diagonal/intermediate, and RCA territories based on patency of all bypass grafts. I suspect he will be able to proceed with surgery and there does not appear to be any indication for further coronary revascularization.  Laboratory: CBC:    Component Value Date/Time   WBC 9.5 04/29/2015 1427   RBC 5.50 04/29/2015 1427   HGB 16.6 04/29/2015 1427    HCT 47.1 04/29/2015 1427   PLT 160 04/29/2015 1427   MCV 85.6 04/29/2015 1427   MCH 30.2 04/29/2015 1427   MCHC 35.2 04/29/2015 1427   RDW 12.7 04/29/2015 1427   LYMPHSABS 2.6 04/29/2015 1427   MONOABS 0.7 04/29/2015 1427   EOSABS 0.3 04/29/2015 1427   BASOSABS 0.0 04/29/2015 1427    BMP:    Component Value Date/Time   NA 132* 04/29/2015 1427   K 4.2 04/29/2015 1427   CL 99 04/29/2015 1427   CO2 22 04/29/2015 1427   GLUCOSE 440* 04/29/2015 1427   BUN 20 04/29/2015 1427   CREATININE 0.96 04/29/2015 1427   CREATININE 0.80 03/30/2015 1055   CALCIUM 9.3 04/29/2015 1427    Coagulation: Lab Results  Component Value Date   INR 0.87 04/29/2015   No results found for: PTT    Medical Decision Making  Hector Green is a 54 y.o. male who presents with: RLE CLI with rest pain, CAD, uncontrolled DM   Unfortunately, patient sabotaged his own care by leaving AMA, so his cardiac evaluation was delayed, so now that he has been cleared I don't have OR time to get him on the schedule for the coming week due to the [facility] Day holiday and already filled OR/PV schedule.  Will see if Dr. Hart Rochester can schedule him for this coming Wednesday for R CFA to PT bypass with L GSV. The risk, benefits, and alternative for bypass operations were discussed with the patient.  The patient is aware the risks include but are not limited to: bleeding, infection, myocardial infarction, stroke, limb loss, nerve damage, need for additional procedures in the future, wound complications, and inability to complete the bypass.   The patient is aware that his CAD and uncontrolled DM will put him at higher risk of mortality (2%) and morbidity (30%). The patient is aware of these risks and agreed to proceed.   I emphasized to him the importance of compliance with his DM regimen.  This patient needs to be started on a insulin regimen to get him under control.  This complicated by his reported fear of needles despite  having multiple tattoos on his arms and legs. Will check with Dr. Hart Rochester if he is willing to take the case.  Leonides Sake, MD [facility] Office: [phone] Pager: [phone]  05/08/2015, 12:38 PM

## 2015-05-09 NOTE — Pre-Procedure Instructions (Addendum)
Chales SalmonJohn W Green  05/09/2015     Your procedure is scheduled on June 1.  [facility] Admitting at 6:30 A.M.  Call this number if you have problems the morning of surgery:  [phone]   Remember:  Do not eat food or drink liquids after midnight.  Take these medicines the morning of surgery with A SIP OF WATER Metoprolol, Oxycodone (if needed)      STOP all herbel meds, nsaids (aleve,naproxen,advil,ibuprofen) now      Aspirin per dr    Drucilla Schmidto not wear jewelry, make-up or nail polish.  Do not wear lotions, powders, or perfumes.  You may wear deodorant.  Do not shave 48 hours prior to surgery.  Men may shave face and neck.  Do not bring valuables to the hospital.  [facility] any belongings or valuables.  Contacts, dentures or bridgework may not be worn into surgery.  Leave your suitcase in the car.  After surgery it may be brought to your room.  For patients admitted to the hospital, discharge time will be determined by your treatment team.  Patients discharged the day of surgery will not be allowed to drive home.    Shower inst:  Special Instructions: [place] - Preparing for Surgery  Before surgery, you can play an important role.  Because skin is not sterile, your skin needs to be as free of germs as possible.  You can reduce the number of germs on you skin by washing with CHG (chlorahexidine gluconate) soap before surgery.  CHG is an antiseptic cleaner which kills germs and bonds with the skin to continue killing germs even after washing.  Please DO NOT use if you have an allergy to CHG or antibacterial soaps.  If your skin becomes reddened/irritated stop using the CHG and inform your nurse when you arrive at Short Stay.  Do not shave (including legs and underarms) for at least 48 hours prior to the first CHG shower.  You may shave your face.  Please follow these instructions carefully:   1.  Shower with CHG Soap the night before  surgery and the morning of Surgery.  2.  If you choose to wash your hair, wash your hair first as usual with your normal shampoo.  3.  After you shampoo, rinse your hair and body thoroughly to remove the Shampoo.  4.  Use CHG as you would any other liquid soap.  You can apply chg directly  to the skin and wash gently with scrungie or a clean washcloth.  5.  Apply the CHG Soap to your body ONLY FROM THE NECK DOWN.  Do not use on open wounds or open sores.  Avoid contact with your eyes ears, mouth and genitals (private parts).  Wash genitals (private parts)       with your normal soap.  6.  Wash thoroughly, paying special attention to the area where your surgery will be performed.  7.  Thoroughly rinse your body with warm water from the neck down.  8.  DO NOT shower/wash with your normal soap after using and rinsing off the CHG Soap.  9.  Pat yourself dry with a clean towel.            10.  Wear clean pajamas.            11.  Place clean sheets on your bed the night of your first shower and do not sleep with pets.  Day of Surgery  Do not apply any lotions/deodorants the morning of surgery.  Please wear clean clothes to the hospital/surgery center.  . Pain Booklet, Coughing and Deep Breathing, Blood Transfusion Information and Surgical Site Infection Prevention

## 2015-05-12 ENCOUNTER — Other Ambulatory Visit: Payer: Self-pay | Admitting: Physician Assistant

## 2015-05-12 ENCOUNTER — Inpatient Hospital Stay (HOSPITAL_COMMUNITY)
Admission: RE | Admit: 2015-05-12 | Discharge: 2015-05-12 | Disposition: A | Discharge status: Home / Self Care | Payer: Medicare Other | Source: Ambulatory Visit

## 2015-05-12 ENCOUNTER — Inpatient Hospital Stay (HOSPITAL_COMMUNITY)
Admission: RE | Admit: 2015-05-12 | Discharge: 2015-05-15 | DRG: 253 | Disposition: A | Discharge status: Home / Self Care | Payer: Medicare Other | Source: Ambulatory Visit | Attending: Vascular Surgery | Admitting: Vascular Surgery

## 2015-05-12 DIAGNOSIS — T82868A Thrombosis of vascular prosthetic devices, implants and grafts, initial encounter: Secondary | ICD-10-CM | POA: Diagnosis not present

## 2015-05-12 DIAGNOSIS — I2510 Atherosclerotic heart disease of native coronary artery without angina pectoris: Secondary | ICD-10-CM | POA: Diagnosis present

## 2015-05-12 DIAGNOSIS — Z880 Allergy status to penicillin: Secondary | ICD-10-CM | POA: Diagnosis not present

## 2015-05-12 DIAGNOSIS — I10 Essential (primary) hypertension: Secondary | ICD-10-CM | POA: Diagnosis present

## 2015-05-12 DIAGNOSIS — Z951 Presence of aortocoronary bypass graft: Secondary | ICD-10-CM

## 2015-05-12 DIAGNOSIS — T82392A Other mechanical complication of femoral arterial graft (bypass), initial encounter: Secondary | ICD-10-CM | POA: Diagnosis not present

## 2015-05-12 DIAGNOSIS — I252 Old myocardial infarction: Secondary | ICD-10-CM | POA: Diagnosis not present

## 2015-05-12 DIAGNOSIS — E1151 Type 2 diabetes mellitus with diabetic peripheral angiopathy without gangrene: Secondary | ICD-10-CM | POA: Diagnosis present

## 2015-05-12 DIAGNOSIS — F17210 Nicotine dependence, cigarettes, uncomplicated: Secondary | ICD-10-CM | POA: Diagnosis present

## 2015-05-12 DIAGNOSIS — E1165 Type 2 diabetes mellitus with hyperglycemia: Secondary | ICD-10-CM | POA: Diagnosis not present

## 2015-05-12 DIAGNOSIS — Y838 Other surgical procedures as the cause of abnormal reaction of the patient, or of later complication, without mention of misadventure at the time of the procedure: Secondary | ICD-10-CM | POA: Diagnosis not present

## 2015-05-12 DIAGNOSIS — E785 Hyperlipidemia, unspecified: Secondary | ICD-10-CM | POA: Diagnosis present

## 2015-05-12 DIAGNOSIS — Z91013 Allergy to seafood: Secondary | ICD-10-CM

## 2015-05-12 DIAGNOSIS — Z79899 Other long term (current) drug therapy: Secondary | ICD-10-CM | POA: Diagnosis not present

## 2015-05-12 DIAGNOSIS — Z7982 Long term (current) use of aspirin: Secondary | ICD-10-CM

## 2015-05-12 DIAGNOSIS — Y92239 Unspecified place in hospital as the place of occurrence of the external cause: Secondary | ICD-10-CM | POA: Diagnosis not present

## 2015-05-12 DIAGNOSIS — Z419 Encounter for procedure for purposes other than remedying health state, unspecified: Secondary | ICD-10-CM

## 2015-05-12 DIAGNOSIS — I70221 Atherosclerosis of native arteries of extremities with rest pain, right leg: Secondary | ICD-10-CM | POA: Diagnosis not present

## 2015-05-12 DIAGNOSIS — Z4889 Encounter for other specified surgical aftercare: Secondary | ICD-10-CM | POA: Diagnosis not present

## 2015-05-12 DIAGNOSIS — I739 Peripheral vascular disease, unspecified: Secondary | ICD-10-CM | POA: Diagnosis present

## 2015-05-12 DIAGNOSIS — I998 Other disorder of circulatory system: Secondary | ICD-10-CM | POA: Diagnosis not present

## 2015-05-12 DIAGNOSIS — Z9889 Other specified postprocedural states: Secondary | ICD-10-CM | POA: Diagnosis not present

## 2015-05-12 LAB — COMPREHENSIVE METABOLIC PANEL
ALBUMIN: 3.4 g/dL — AB (ref 3.5–5.0)
ALT: 12 U/L — ABNORMAL LOW (ref 17–63)
AST: 11 U/L — AB (ref 15–41)
Alkaline Phosphatase: 55 U/L (ref 38–126)
Anion gap: 9 (ref 5–15)
BILIRUBIN TOTAL: 0.6 mg/dL (ref 0.3–1.2)
BUN: 19 mg/dL (ref 6–20)
CO2: 23 mmol/L (ref 22–32)
CREATININE: 0.94 mg/dL (ref 0.61–1.24)
Calcium: 9.2 mg/dL (ref 8.9–10.3)
Chloride: 106 mmol/L (ref 101–111)
Glucose, Bld: 211 mg/dL — ABNORMAL HIGH (ref 65–99)
Potassium: 4.9 mmol/L (ref 3.5–5.1)
Sodium: 138 mmol/L (ref 135–145)
TOTAL PROTEIN: 5.6 g/dL — AB (ref 6.5–8.1)

## 2015-05-12 LAB — GLUCOSE, CAPILLARY
GLUCOSE-CAPILLARY: 240 mg/dL — AB (ref 65–99)
GLUCOSE-CAPILLARY: 189 mg/dL — AB (ref 65–99)
Glucose-Capillary: 182 mg/dL — ABNORMAL HIGH (ref 65–99)
Glucose-Capillary: 149 mg/dL — ABNORMAL HIGH (ref 65–99)
Glucose-Capillary: 173 mg/dL — ABNORMAL HIGH (ref 65–99)

## 2015-05-12 LAB — PROTIME-INR
INR: 1.04 (ref 0.00–1.49)
Prothrombin Time: 13.8 seconds (ref 11.6–15.2)

## 2015-05-12 LAB — CBC
HCT: 41.4 % (ref 39.0–52.0)
Hemoglobin: 14.3 g/dL (ref 13.0–17.0)
MCH: 29.4 pg (ref 26.0–34.0)
MCHC: 34.5 g/dL (ref 30.0–36.0)
MCV: 85.2 fL (ref 78.0–100.0)
PLATELETS: 133 10*3/uL — AB (ref 150–400)
RBC: 4.86 MIL/uL (ref 4.22–5.81)
RDW: 12.8 % (ref 11.5–15.5)
WBC: 8.6 10*3/uL (ref 4.0–10.5)

## 2015-05-12 LAB — MRSA PCR SCREENING: MRSA by PCR: NEGATIVE

## 2015-05-12 MED ORDER — VANCOMYCIN HCL IN DEXTROSE 1-5 GM/200ML-% IV SOLN
1000.0000 mg | INTRAVENOUS | Status: DC
  Filled 2015-05-12: qty 200

## 2015-05-12 MED ORDER — ONDANSETRON HCL 4 MG/2ML IJ SOLN: 4.0000 mg | Freq: Four times a day (QID) | INTRAMUSCULAR | Status: DC | PRN

## 2015-05-12 MED ORDER — SODIUM CHLORIDE 0.9 % IV SOLN
INTRAVENOUS | Status: DC
  Administered 2015-05-12 (×2): 1.8 [IU]/h via INTRAVENOUS
  Filled 2015-05-12 (×2): qty 2.5

## 2015-05-12 MED ORDER — METOPROLOL TARTRATE 12.5 MG HALF TABLET
12.5000 mg | ORAL_TABLET | Freq: Every day | ORAL | Status: DC
  Administered 2015-05-12 – 2015-05-15 (×3): 12.5 mg via ORAL
  Filled 2015-05-12 (×4): qty 1

## 2015-05-12 MED ORDER — SODIUM CHLORIDE 0.9 % IV SOLN
1000.0000 mL | INTRAVENOUS | Status: DC
  Administered 2015-05-12: 1000 mL via INTRAVENOUS
  Administered 2015-05-13: 19:00:00 via INTRAVENOUS
  Administered 2015-05-13 – 2015-05-15 (×3): 1000 mL via INTRAVENOUS

## 2015-05-12 MED ORDER — ATORVASTATIN CALCIUM 80 MG PO TABS
80.0000 mg | ORAL_TABLET | Freq: Every day | ORAL | Status: DC
  Administered 2015-05-12 – 2015-05-15 (×3): 80 mg via ORAL
  Filled 2015-05-12 (×4): qty 1

## 2015-05-12 MED ORDER — ASPIRIN EC 81 MG PO TBEC
81.0000 mg | DELAYED_RELEASE_TABLET | Freq: Every day | ORAL | Status: DC
  Administered 2015-05-12: 81 mg via ORAL
  Filled 2015-05-12 (×2): qty 1

## 2015-05-12 MED ORDER — SODIUM CHLORIDE 0.9 % IV SOLN: INTRAVENOUS | Status: DC

## 2015-05-12 MED ORDER — ENOXAPARIN SODIUM 30 MG/0.3ML ~~LOC~~ SOLN
30.0000 mg | SUBCUTANEOUS | Status: DC
  Administered 2015-05-12: 30 mg via SUBCUTANEOUS
  Filled 2015-05-12 (×2): qty 0.3

## 2015-05-12 MED ORDER — PHENOL 1.4 % MT LIQD: 1.0000 | OROMUCOSAL | Status: DC | PRN

## 2015-05-12 MED ORDER — OXYCODONE-ACETAMINOPHEN 5-325 MG PO TABS: 1.0000 | ORAL_TABLET | ORAL | Status: DC | PRN

## 2015-05-12 MED ORDER — ALUM & MAG HYDROXIDE-SIMETH 200-200-20 MG/5ML PO SUSP
15.0000 mL | ORAL | Status: DC | PRN
Start: 2015-05-12 — End: 2015-05-13

## 2015-05-12 MED ORDER — HYDRALAZINE HCL 20 MG/ML IJ SOLN: 5.0000 mg | INTRAMUSCULAR | Status: DC | PRN

## 2015-05-12 MED ORDER — POTASSIUM CHLORIDE CRYS ER 20 MEQ PO TBCR
20.0000 meq | EXTENDED_RELEASE_TABLET | Freq: Once | ORAL | Status: DC
  Filled 2015-05-12: qty 1

## 2015-05-12 MED ORDER — LABETALOL HCL 5 MG/ML IV SOLN: 10.0000 mg | INTRAVENOUS | Status: DC | PRN

## 2015-05-12 MED ORDER — VANCOMYCIN HCL IN DEXTROSE 1-5 GM/200ML-% IV SOLN
1000.0000 mg | INTRAVENOUS | Status: AC
  Administered 2015-05-13: 1000 mg via INTRAVENOUS
  Filled 2015-05-12 (×2): qty 200

## 2015-05-12 MED ORDER — PANTOPRAZOLE SODIUM 40 MG PO TBEC
40.0000 mg | DELAYED_RELEASE_TABLET | Freq: Every day | ORAL | Status: DC
  Administered 2015-05-12 – 2015-05-15 (×3): 40 mg via ORAL
  Filled 2015-05-12 (×3): qty 1

## 2015-05-12 MED ORDER — METOPROLOL TARTRATE 1 MG/ML IV SOLN: 2.0000 mg | INTRAVENOUS | Status: DC | PRN

## 2015-05-12 MED ORDER — GUAIFENESIN-DM 100-10 MG/5ML PO SYRP: 15.0000 mL | ORAL_SOLUTION | ORAL | Status: DC | PRN

## 2015-05-12 NOTE — Progress Notes (Signed)
Anesthesia Chart Review:  Pt is 54 year old male scheduled for R femoral-proximal posterior tibial artery bypass graft for critical limb ischemia on 05/13/2015 with Dr. Hart RochesterLawson.   PMH includes: CAD (s/p CABG 2011), HTN, hyperlipidemia, DM, PAD. Current smoker. BMI 27.5.   Medications include: ASA, lipitor, metformin, metoprolol.   Pt has PAT appointment today at 3pm. Preoperative labs will be obtained at that time. Glucose has been 218-440 over last month. Per Dr. Nicky Pughhen's note dated 05/08/15 reports pt has uncontrolled DM and has not started insulin therapy as prescribed.   EKG 05/06/2015: Sinus rhythm with occasional PVCs. New since previous tracing. Possible Left atrial enlargement. Left axis deviation. Incomplete right bundle branch block  Cardiac cath 05/06/2015 for high risk stress test: 1. Severe native three-vessel coronary artery disease with total occlusion of the proximal RCA, total occlusion of the proximal left circumflex, and total occlusion of the LAD after the first septal perforator 2. Status post aortocoronary bypass surgery with continued patency of the LIMA to LAD, saphenous vein graft to intermediate, and 7 his vein graft to right PDA 3. Total occlusion of the AV groove circumflex collateralized from the saphenous vein graft to intermediate 4. Moderately severe segmental left ventricular systolic dysfunction with LVEF estimated at 35% --Discussion: The patient has continued patency of his bypass grafts. He may have ischemia in the distribution of the second and third obtuse marginal branches which are collateralized. Otherwise, he has what appears to be normal perfusion to the LAD, diagonal/intermediate, and RCA territories based on patency of all bypass grafts. I suspect he will be able to proceed with surgery and there does not appear to be any indication for further coronary revascularization.  If labs are acceptable, I anticipate pt can proceed as scheduled.   Hector Mastngela Atom Solivan,  FNP-BC [facility]/Anesthesiology Phone: [phone] 05/12/2015 12:24 PM

## 2015-05-12 NOTE — H&P (Signed)
[facility]   History and Physical Update  The patient was interviewed and re-examined.  The patient's previous History and Physical has been reviewed and is unchanged from my consult on 05/08/15.  There is no change in the plan of care:  R CFA to PT bypass with L GSV.  Pt was admitted on 05/12/15 for mgmt of his hyperglycemia and resuscitation if there was an electrolyte abnormalities.  Laboratory: CBC:    Component Value Date/Time   WBC 8.6 05/12/2015 1943   RBC 4.86 05/12/2015 1943   HGB 14.3 05/12/2015 1943   HCT 41.4 05/12/2015 1943   PLT 133* 05/12/2015 1943   MCV 85.2 05/12/2015 1943   MCH 29.4 05/12/2015 1943   MCHC 34.5 05/12/2015 1943   RDW 12.8 05/12/2015 1943   LYMPHSABS 2.6 04/29/2015 1427   MONOABS 0.7 04/29/2015 1427   EOSABS 0.3 04/29/2015 1427   BASOSABS 0.0 04/29/2015 1427    BMP:    Component Value Date/Time   NA 138 05/12/2015 1943   K 4.9 05/12/2015 1943   CL 106 05/12/2015 1943   CO2 23 05/12/2015 1943   GLUCOSE 211* 05/12/2015 1943   BUN 19 05/12/2015 1943   CREATININE 0.94 05/12/2015 1943   CREATININE 0.96 04/29/2015 1427   CALCIUM 9.2 05/12/2015 1943   GFRNONAA >60 05/12/2015 1943   GFRAA >60 05/12/2015 1943    Coagulation: Lab Results  Component Value Date   INR 1.04 05/12/2015   INR 0.87 04/29/2015   No results found for: PTT   Leonides SakeBrian Lachina Salsberry, MD [facility] [place] Office: [phone] Pager: [phone]  05/12/2015, 10:53 PM

## 2015-05-13 ENCOUNTER — Inpatient Hospital Stay (HOSPITAL_COMMUNITY): Payer: Medicare Other

## 2015-05-13 ENCOUNTER — Inpatient Hospital Stay (HOSPITAL_COMMUNITY): Payer: Medicare Other | Admitting: Certified Registered"

## 2015-05-13 ENCOUNTER — Inpatient Hospital Stay (HOSPITAL_COMMUNITY): Payer: Medicare Other | Admitting: Critical Care Medicine

## 2015-05-13 ENCOUNTER — Encounter (HOSPITAL_COMMUNITY)
Admission: RE | Disposition: A | Discharge status: Home / Self Care | Payer: Self-pay | Source: Ambulatory Visit | Attending: Vascular Surgery

## 2015-05-13 ENCOUNTER — Inpatient Hospital Stay (HOSPITAL_COMMUNITY): Payer: Medicare Other | Admitting: Emergency Medicine

## 2015-05-13 ENCOUNTER — Encounter (HOSPITAL_COMMUNITY): Payer: Self-pay | Admitting: Critical Care Medicine

## 2015-05-13 DIAGNOSIS — T82868A Thrombosis of vascular prosthetic devices, implants and grafts, initial encounter: Secondary | ICD-10-CM

## 2015-05-13 DIAGNOSIS — I998 Other disorder of circulatory system: Secondary | ICD-10-CM

## 2015-05-13 HISTORY — PX: ENDARTERECTOMY FEMORAL: SHX5804

## 2015-05-13 HISTORY — PX: FEMORAL-TIBIAL BYPASS GRAFT: SHX938

## 2015-05-13 HISTORY — PX: INTRAOPERATIVE ARTERIOGRAM: SHX5157

## 2015-05-13 HISTORY — PX: VEIN HARVEST: SHX6363

## 2015-05-13 HISTORY — PX: THROMBECTOMY FEMORAL ARTERY: SHX6406

## 2015-05-13 HISTORY — PX: PATCH ANGIOPLASTY: SHX6230

## 2015-05-13 LAB — CBC
HCT: 43.5 % (ref 39.0–52.0)
HEMATOCRIT: 43.2 % (ref 39.0–52.0)
HEMOGLOBIN: 14.6 g/dL (ref 13.0–17.0)
Hemoglobin: 14.9 g/dL (ref 13.0–17.0)
MCH: 28.9 pg (ref 26.0–34.0)
MCH: 29.2 pg (ref 26.0–34.0)
MCHC: 33.8 g/dL (ref 30.0–36.0)
MCHC: 34.3 g/dL (ref 30.0–36.0)
MCV: 85.4 fL (ref 78.0–100.0)
MCV: 85.3 fL (ref 78.0–100.0)
PLATELETS: 125 10*3/uL — AB (ref 150–400)
Platelets: 125 10*3/uL — ABNORMAL LOW (ref 150–400)
RBC: 5.06 MIL/uL (ref 4.22–5.81)
RBC: 5.1 MIL/uL (ref 4.22–5.81)
RDW: 12.8 % (ref 11.5–15.5)
RDW: 12.8 % (ref 11.5–15.5)
WBC: 9.1 10*3/uL (ref 4.0–10.5)
WBC: 9 10*3/uL (ref 4.0–10.5)

## 2015-05-13 LAB — GLUCOSE, CAPILLARY
GLUCOSE-CAPILLARY: 135 mg/dL — AB (ref 65–99)
GLUCOSE-CAPILLARY: 138 mg/dL — AB (ref 65–99)
GLUCOSE-CAPILLARY: 183 mg/dL — AB (ref 65–99)
GLUCOSE-CAPILLARY: 192 mg/dL — AB (ref 65–99)
Glucose-Capillary: 175 mg/dL — ABNORMAL HIGH (ref 65–99)
Glucose-Capillary: 196 mg/dL — ABNORMAL HIGH (ref 65–99)
Glucose-Capillary: 206 mg/dL — ABNORMAL HIGH (ref 65–99)
Glucose-Capillary: 173 mg/dL — ABNORMAL HIGH (ref 65–99)

## 2015-05-13 LAB — BASIC METABOLIC PANEL
Anion gap: 6 (ref 5–15)
BUN: 17 mg/dL (ref 6–20)
CO2: 27 mmol/L (ref 22–32)
Calcium: 9.1 mg/dL (ref 8.9–10.3)
Chloride: 105 mmol/L (ref 101–111)
Creatinine, Ser: 1.02 mg/dL (ref 0.61–1.24)
GFR calc Af Amer: >60 mL/min (ref >60)
GLUCOSE: 164 mg/dL — AB (ref 65–99)
Potassium: 4.8 mmol/L (ref 3.5–5.1)
SODIUM: 138 mmol/L (ref 135–145)

## 2015-05-13 LAB — PROTIME-INR
INR: 1.01 (ref 0.00–1.49)
INR: 0.96 (ref 0.00–1.49)
PROTHROMBIN TIME: 13.5 s (ref 11.6–15.2)
Prothrombin Time: 13 seconds (ref 11.6–15.2)

## 2015-05-13 LAB — COMPREHENSIVE METABOLIC PANEL
ALBUMIN: 3.1 g/dL — AB (ref 3.5–5.0)
ALT: 13 U/L — AB (ref 17–63)
AST: 13 U/L — ABNORMAL LOW (ref 15–41)
Alkaline Phosphatase: 56 U/L (ref 38–126)
Anion gap: 7 (ref 5–15)
BILIRUBIN TOTAL: 0.5 mg/dL (ref 0.3–1.2)
BUN: 17 mg/dL (ref 6–20)
CO2: 25 mmol/L (ref 22–32)
Calcium: 9 mg/dL (ref 8.9–10.3)
Chloride: 105 mmol/L (ref 101–111)
Creatinine, Ser: 1.02 mg/dL (ref 0.61–1.24)
Glucose, Bld: 177 mg/dL — ABNORMAL HIGH (ref 65–99)
Potassium: 5.3 mmol/L — ABNORMAL HIGH (ref 3.5–5.1)
SODIUM: 137 mmol/L (ref 135–145)
Total Protein: 5.9 g/dL — ABNORMAL LOW (ref 6.5–8.1)

## 2015-05-13 LAB — URINALYSIS, ROUTINE W REFLEX MICROSCOPIC
Bilirubin Urine: NEGATIVE
Glucose, UA: 250 mg/dL — AB
Hgb urine dipstick: NEGATIVE
KETONES UR: NEGATIVE mg/dL
LEUKOCYTES UA: NEGATIVE
Nitrite: NEGATIVE
PROTEIN: 30 mg/dL — AB
SPECIFIC GRAVITY, URINE: 1.014 (ref 1.005–1.030)
UROBILINOGEN UA: 1 mg/dL (ref 0.0–1.0)
pH: 5 (ref 5.0–8.0)

## 2015-05-13 LAB — URINE MICROSCOPIC-ADD ON

## 2015-05-13 LAB — APTT: aPTT: 28 seconds (ref 24–37)

## 2015-05-13 LAB — PREPARE RBC (CROSSMATCH)

## 2015-05-13 LAB — ABO/RH: ABO/RH(D): A POS

## 2015-05-13 SURGERY — THROMBECTOMY, ARTERY, FEMORAL
Anesthesia: General | Site: Leg Lower | Laterality: Right

## 2015-05-13 SURGERY — CREATION, BYPASS, ARTERIAL, FEMORAL TO TIBIAL, USING GRAFT
Anesthesia: General | Site: Leg Lower | Laterality: Right

## 2015-05-13 MED ORDER — SODIUM CHLORIDE 0.9 % IV SOLN: 500.0000 mL | Freq: Once | INTRAVENOUS | Status: AC | PRN

## 2015-05-13 MED ORDER — PHENYLEPHRINE HCL 10 MG/ML IJ SOLN
10.0000 mg | INTRAVENOUS | Status: DC | PRN
  Administered 2015-05-13: 25 ug/min via INTRAVENOUS

## 2015-05-13 MED ORDER — HEPARIN SODIUM (PORCINE) 1000 UNIT/ML IJ SOLN
INTRAMUSCULAR | Status: DC | PRN
Start: 2015-05-13 — End: 2015-05-13
  Administered 2015-05-13: 2000 [IU] via INTRAVENOUS
  Administered 2015-05-13: 6000 [IU] via INTRAVENOUS

## 2015-05-13 MED ORDER — CHLORHEXIDINE GLUCONATE 4 % EX LIQD
60.0000 mL | Freq: Once | CUTANEOUS | Status: AC
  Administered 2015-05-13: 4 via TOPICAL
  Filled 2015-05-13: qty 60

## 2015-05-13 MED ORDER — ONDANSETRON HCL 4 MG/2ML IJ SOLN
4.0000 mg | Freq: Four times a day (QID) | INTRAMUSCULAR | Status: DC | PRN
  Administered 2015-05-13 (×2): 4 mg via INTRAVENOUS
  Filled 2015-05-13 (×2): qty 2

## 2015-05-13 MED ORDER — HYDROMORPHONE HCL 1 MG/ML IJ SOLN
INTRAMUSCULAR | Status: AC
  Filled 2015-05-13: qty 1

## 2015-05-13 MED ORDER — GUAIFENESIN-DM 100-10 MG/5ML PO SYRP: 15.0000 mL | ORAL_SOLUTION | ORAL | Status: DC | PRN

## 2015-05-13 MED ORDER — PROPOFOL 10 MG/ML IV BOLUS
INTRAVENOUS | Status: DC | PRN
  Administered 2015-05-13: 200 mg via INTRAVENOUS

## 2015-05-13 MED ORDER — ALUM & MAG HYDROXIDE-SIMETH 200-200-20 MG/5ML PO SUSP: 15.0000 mL | ORAL | Status: DC | PRN

## 2015-05-13 MED ORDER — IOHEXOL 300 MG/ML  SOLN
INTRAMUSCULAR | Status: DC | PRN
  Administered 2015-05-13: 50 mL via INTRAVENOUS

## 2015-05-13 MED ORDER — LACTATED RINGERS IV SOLN
INTRAVENOUS | Status: DC | PRN
  Administered 2015-05-13 (×2): via INTRAVENOUS

## 2015-05-13 MED ORDER — LIDOCAINE HCL (CARDIAC) 20 MG/ML IV SOLN
INTRAVENOUS | Status: AC
  Filled 2015-05-13: qty 10

## 2015-05-13 MED ORDER — INSULIN ASPART 100 UNIT/ML ~~LOC~~ SOLN
0.0000 [IU] | SUBCUTANEOUS | Status: DC
  Administered 2015-05-13 – 2015-05-14 (×2): 3 [IU] via SUBCUTANEOUS
  Administered 2015-05-14 (×4): 5 [IU] via SUBCUTANEOUS
  Administered 2015-05-14: 3 [IU] via SUBCUTANEOUS
  Administered 2015-05-14: 5 [IU] via SUBCUTANEOUS
  Administered 2015-05-15: 2 [IU] via SUBCUTANEOUS

## 2015-05-13 MED ORDER — MIDAZOLAM HCL 5 MG/5ML IJ SOLN
INTRAMUSCULAR | Status: DC | PRN
  Administered 2015-05-13: 2 mg via INTRAVENOUS

## 2015-05-13 MED ORDER — ACETAMINOPHEN 325 MG RE SUPP
325.0000 mg | RECTAL | Status: DC | PRN
  Filled 2015-05-13: qty 2

## 2015-05-13 MED ORDER — FENTANYL CITRATE (PF) 100 MCG/2ML IJ SOLN
INTRAMUSCULAR | Status: DC | PRN
  Administered 2015-05-13: 50 ug via INTRAVENOUS
  Administered 2015-05-13: 100 ug via INTRAVENOUS
  Administered 2015-05-13: 50 ug via INTRAVENOUS

## 2015-05-13 MED ORDER — HEPARIN (PORCINE) IN NACL 100-0.45 UNIT/ML-% IJ SOLN
600.0000 [IU]/h | INTRAMUSCULAR | Status: DC
  Administered 2015-05-13: 600 [IU]/h via INTRAVENOUS
  Filled 2015-05-13: qty 250

## 2015-05-13 MED ORDER — LIDOCAINE HCL (CARDIAC) 20 MG/ML IV SOLN
INTRAVENOUS | Status: AC
  Filled 2015-05-13: qty 5

## 2015-05-13 MED ORDER — BISACODYL 10 MG RE SUPP: 10.0000 mg | Freq: Every day | RECTAL | Status: DC | PRN

## 2015-05-13 MED ORDER — PROPOFOL 10 MG/ML IV BOLUS
INTRAVENOUS | Status: AC
  Filled 2015-05-13: qty 20

## 2015-05-13 MED ORDER — ROCURONIUM BROMIDE 100 MG/10ML IV SOLN
INTRAVENOUS | Status: DC | PRN
  Administered 2015-05-13: 50 mg via INTRAVENOUS

## 2015-05-13 MED ORDER — ONDANSETRON HCL 4 MG/2ML IJ SOLN
INTRAMUSCULAR | Status: AC
  Filled 2015-05-13: qty 2

## 2015-05-13 MED ORDER — FENTANYL CITRATE (PF) 250 MCG/5ML IJ SOLN
INTRAMUSCULAR | Status: AC
  Filled 2015-05-13: qty 5

## 2015-05-13 MED ORDER — DEXAMETHASONE SODIUM PHOSPHATE 4 MG/ML IJ SOLN
INTRAMUSCULAR | Status: DC | PRN
  Administered 2015-05-13: 8 mg via INTRAVENOUS

## 2015-05-13 MED ORDER — ONDANSETRON HCL 4 MG/2ML IJ SOLN
INTRAMUSCULAR | Status: AC
  Filled 2015-05-13: qty 4

## 2015-05-13 MED ORDER — EPHEDRINE SULFATE 50 MG/ML IJ SOLN
INTRAMUSCULAR | Status: AC
  Filled 2015-05-13: qty 1

## 2015-05-13 MED ORDER — FENTANYL CITRATE (PF) 100 MCG/2ML IJ SOLN
INTRAMUSCULAR | Status: DC | PRN
  Administered 2015-05-13: 50 ug via INTRAVENOUS
  Administered 2015-05-13 (×2): 25 ug via INTRAVENOUS
  Administered 2015-05-13: 50 ug via INTRAVENOUS
  Administered 2015-05-13: 25 ug via INTRAVENOUS
  Administered 2015-05-13 (×2): 50 ug via INTRAVENOUS
  Administered 2015-05-13: 100 ug via INTRAVENOUS
  Administered 2015-05-13: 50 ug via INTRAVENOUS
  Administered 2015-05-13: 25 ug via INTRAVENOUS

## 2015-05-13 MED ORDER — HEPARIN SODIUM (PORCINE) 1000 UNIT/ML IJ SOLN
INTRAMUSCULAR | Status: DC | PRN
  Administered 2015-05-13: 8000 [IU] via INTRAVENOUS

## 2015-05-13 MED ORDER — POTASSIUM CHLORIDE CRYS ER 20 MEQ PO TBCR: 20.0000 meq | EXTENDED_RELEASE_TABLET | Freq: Every day | ORAL | Status: DC | PRN

## 2015-05-13 MED ORDER — LIDOCAINE HCL (CARDIAC) 20 MG/ML IV SOLN
INTRAVENOUS | Status: DC | PRN
  Administered 2015-05-13: 70 mg via INTRAVENOUS
  Administered 2015-05-13: 100 mg via INTRATRACHEAL

## 2015-05-13 MED ORDER — MIDAZOLAM HCL 2 MG/2ML IJ SOLN
INTRAMUSCULAR | Status: AC
  Filled 2015-05-13: qty 2

## 2015-05-13 MED ORDER — VANCOMYCIN HCL IN DEXTROSE 1-5 GM/200ML-% IV SOLN
1000.0000 mg | Freq: Two times a day (BID) | INTRAVENOUS | Status: AC
  Administered 2015-05-13 – 2015-05-14 (×2): 1000 mg via INTRAVENOUS
  Filled 2015-05-13 (×2): qty 200

## 2015-05-13 MED ORDER — PHENYLEPHRINE 40 MCG/ML (10ML) SYRINGE FOR IV PUSH (FOR BLOOD PRESSURE SUPPORT)
PREFILLED_SYRINGE | INTRAVENOUS | Status: AC
  Filled 2015-05-13: qty 10

## 2015-05-13 MED ORDER — ACETAMINOPHEN 325 MG PO TABS: 325.0000 mg | ORAL_TABLET | ORAL | Status: DC | PRN

## 2015-05-13 MED ORDER — DOCUSATE SODIUM 100 MG PO CAPS
100.0000 mg | ORAL_CAPSULE | Freq: Every day | ORAL | Status: DC
  Administered 2015-05-14: 100 mg via ORAL
  Filled 2015-05-13 (×2): qty 1

## 2015-05-13 MED ORDER — 0.9 % SODIUM CHLORIDE (POUR BTL) OPTIME
TOPICAL | Status: DC | PRN
  Administered 2015-05-13: 3000 mL

## 2015-05-13 MED ORDER — ENOXAPARIN SODIUM 40 MG/0.4ML ~~LOC~~ SOLN: 40.0000 mg | SUBCUTANEOUS | Status: DC

## 2015-05-13 MED ORDER — SUCCINYLCHOLINE CHLORIDE 20 MG/ML IJ SOLN
INTRAMUSCULAR | Status: AC
  Filled 2015-05-13: qty 1

## 2015-05-13 MED ORDER — HEPARIN SODIUM (PORCINE) 5000 UNIT/ML IJ SOLN
INTRAMUSCULAR | Status: DC | PRN
  Administered 2015-05-13: 09:00:00

## 2015-05-13 MED ORDER — ARTIFICIAL TEARS OP OINT
TOPICAL_OINTMENT | OPHTHALMIC | Status: AC
  Filled 2015-05-13: qty 3.5

## 2015-05-13 MED ORDER — ASPIRIN EC 81 MG PO TBEC
81.0000 mg | DELAYED_RELEASE_TABLET | Freq: Every day | ORAL | Status: DC
  Administered 2015-05-14 – 2015-05-15 (×2): 81 mg via ORAL
  Filled 2015-05-13 (×2): qty 1

## 2015-05-13 MED ORDER — LIDOCAINE HCL (CARDIAC) 20 MG/ML IV SOLN
INTRAVENOUS | Status: DC | PRN
  Administered 2015-05-13: 50 mg via INTRAVENOUS

## 2015-05-13 MED ORDER — SODIUM CHLORIDE 0.9 % IJ SOLN
INTRAMUSCULAR | Status: AC
  Filled 2015-05-13: qty 10

## 2015-05-13 MED ORDER — MORPHINE SULFATE 2 MG/ML IJ SOLN
2.0000 mg | INTRAMUSCULAR | Status: DC | PRN
  Administered 2015-05-13: 2 mg via INTRAVENOUS

## 2015-05-13 MED ORDER — 0.9 % SODIUM CHLORIDE (POUR BTL) OPTIME
TOPICAL | Status: DC | PRN
  Administered 2015-05-13: 1000 mL

## 2015-05-13 MED ORDER — NEOSTIGMINE METHYLSULFATE 10 MG/10ML IV SOLN
INTRAVENOUS | Status: DC | PRN
  Administered 2015-05-13: 1 mg via INTRAVENOUS

## 2015-05-13 MED ORDER — GLYCOPYRROLATE 0.2 MG/ML IJ SOLN
INTRAMUSCULAR | Status: DC | PRN
  Administered 2015-05-13: 0.2 mg via INTRAVENOUS

## 2015-05-13 MED ORDER — HYDRALAZINE HCL 20 MG/ML IJ SOLN: 5.0000 mg | INTRAMUSCULAR | Status: DC | PRN

## 2015-05-13 MED ORDER — METOPROLOL TARTRATE 1 MG/ML IV SOLN: 2.0000 mg | INTRAVENOUS | Status: DC | PRN

## 2015-05-13 MED ORDER — SUCCINYLCHOLINE CHLORIDE 20 MG/ML IJ SOLN
INTRAMUSCULAR | Status: DC | PRN
  Administered 2015-05-13: 80 mg via INTRAVENOUS

## 2015-05-13 MED ORDER — LACTATED RINGERS IV SOLN
INTRAVENOUS | Status: DC | PRN
  Administered 2015-05-13: 19:00:00 via INTRAVENOUS

## 2015-05-13 MED ORDER — HYDROMORPHONE HCL 1 MG/ML IJ SOLN
0.2500 mg | INTRAMUSCULAR | Status: DC | PRN
  Administered 2015-05-13: 0.5 mg via INTRAVENOUS

## 2015-05-13 MED ORDER — LABETALOL HCL 5 MG/ML IV SOLN
10.0000 mg | INTRAVENOUS | Status: DC | PRN
  Filled 2015-05-13: qty 4

## 2015-05-13 MED ORDER — ROCURONIUM BROMIDE 50 MG/5ML IV SOLN
INTRAVENOUS | Status: AC
  Filled 2015-05-13: qty 1

## 2015-05-13 MED ORDER — CHLORHEXIDINE GLUCONATE 4 % EX LIQD
60.0000 mL | Freq: Once | CUTANEOUS | Status: DC
  Filled 2015-05-13: qty 60

## 2015-05-13 MED ORDER — SODIUM CHLORIDE 0.9 % IR SOLN
Status: DC | PRN
  Administered 2015-05-13: 500 mL

## 2015-05-13 MED ORDER — MORPHINE SULFATE 2 MG/ML IJ SOLN
INTRAMUSCULAR | Status: AC
  Filled 2015-05-13: qty 1

## 2015-05-13 MED ORDER — SENNOSIDES-DOCUSATE SODIUM 8.6-50 MG PO TABS
1.0000 | ORAL_TABLET | Freq: Every evening | ORAL | Status: DC | PRN
  Filled 2015-05-13: qty 1

## 2015-05-13 MED ORDER — PROPOFOL 10 MG/ML IV BOLUS
INTRAVENOUS | Status: DC | PRN
  Administered 2015-05-13: 120 mg via INTRAVENOUS

## 2015-05-13 MED ORDER — ONDANSETRON HCL 4 MG/2ML IJ SOLN
INTRAMUSCULAR | Status: DC | PRN
  Administered 2015-05-13: 4 mg via INTRAVENOUS

## 2015-05-13 MED ORDER — HEPARIN SODIUM (PORCINE) 1000 UNIT/ML IJ SOLN
INTRAMUSCULAR | Status: AC
  Filled 2015-05-13: qty 1

## 2015-05-13 MED ORDER — DEXAMETHASONE SODIUM PHOSPHATE 4 MG/ML IJ SOLN
INTRAMUSCULAR | Status: AC
  Filled 2015-05-13: qty 2

## 2015-05-13 SURGICAL SUPPLY — 39 items
ARMBAND PINK RESTRICT EXTREMIT (MISCELLANEOUS) IMPLANT
BAG ISOLATION DRAPE 18X18 (DRAPES) ×1 IMPLANT
BENZOIN TINCTURE PRP APPL 2/3 (GAUZE/BANDAGES/DRESSINGS) ×3 IMPLANT
CANISTER SUCTION 2500CC (MISCELLANEOUS) ×3 IMPLANT
CANNULA VESSEL 3MM 2 BLNT TIP (CANNULA) IMPLANT
CATH EMB 4FR 80CM (CATHETERS) IMPLANT
CLIP LIGATING EXTRA MED SLVR (CLIP) ×3 IMPLANT
CLIP LIGATING EXTRA SM BLUE (MISCELLANEOUS) ×3 IMPLANT
CLOSURE STERI-STRIP 1/2X4 (GAUZE/BANDAGES/DRESSINGS) ×1
CLOSURE WOUND 1/2 X4 (GAUZE/BANDAGES/DRESSINGS)
CLSR STERI-STRIP ANTIMIC 1/2X4 (GAUZE/BANDAGES/DRESSINGS) ×2 IMPLANT
DECANTER SPIKE VIAL GLASS SM (MISCELLANEOUS) IMPLANT
DRAPE INCISE IOBAN 66X45 STRL (DRAPES) ×3 IMPLANT
DRAPE ISOLATION BAG 18X18 (DRAPES) ×2
DRSG COVADERM 4X6 (GAUZE/BANDAGES/DRESSINGS) ×3 IMPLANT
ELECT REM PT RETURN 9FT ADLT (ELECTROSURGICAL) ×3
ELECTRODE REM PT RTRN 9FT ADLT (ELECTROSURGICAL) ×1 IMPLANT
GAUZE SPONGE 4X4 12PLY STRL (GAUZE/BANDAGES/DRESSINGS) ×3 IMPLANT
GEL ULTRASOUND 20GR AQUASONIC (MISCELLANEOUS) IMPLANT
GLOVE BIO SURGEON STRL SZ 6.5 (GLOVE) ×2 IMPLANT
GLOVE BIO SURGEONS STRL SZ 6.5 (GLOVE) ×1
GLOVE BIOGEL PI IND STRL 6.5 (GLOVE) ×4 IMPLANT
GLOVE BIOGEL PI INDICATOR 6.5 (GLOVE) ×8
GLOVE SS BIOGEL STRL SZ 7.5 (GLOVE) ×2 IMPLANT
GLOVE SUPERSENSE BIOGEL SZ 7.5 (GLOVE) ×4
GLOVE SURG SS PI 7.5 STRL IVOR (GLOVE) ×3 IMPLANT
GOWN STRL REUS W/ TWL LRG LVL3 (GOWN DISPOSABLE) ×3 IMPLANT
GOWN STRL REUS W/TWL LRG LVL3 (GOWN DISPOSABLE) ×6
KIT BASIN OR (CUSTOM PROCEDURE TRAY) ×3 IMPLANT
KIT ROOM TURNOVER OR (KITS) ×3 IMPLANT
NS IRRIG 1000ML POUR BTL (IV SOLUTION) ×3 IMPLANT
PACK CV ACCESS (CUSTOM PROCEDURE TRAY) ×3 IMPLANT
PAD ARMBOARD 7.5X6 YLW CONV (MISCELLANEOUS) ×6 IMPLANT
STRIP CLOSURE SKIN 1/2X4 (GAUZE/BANDAGES/DRESSINGS) IMPLANT
SUT PROLENE 6 0 CC (SUTURE) ×3 IMPLANT
SUT VIC AB 3-0 SH 27 (SUTURE) ×2
SUT VIC AB 3-0 SH 27X BRD (SUTURE) ×1 IMPLANT
UNDERPAD 30X30 INCONTINENT (UNDERPADS AND DIAPERS) ×3 IMPLANT
WATER STERILE IRR 1000ML POUR (IV SOLUTION) ×3 IMPLANT

## 2015-05-13 SURGICAL SUPPLY — 47 items
BLADE SURG 10 STRL SS (BLADE) ×5 IMPLANT
CANISTER SUCTION 2500CC (MISCELLANEOUS) ×5 IMPLANT
CLIP TI MEDIUM 24 (CLIP) ×5 IMPLANT
CLIP TI WIDE RED SMALL 24 (CLIP) ×5 IMPLANT
COVER SURGICAL LIGHT HANDLE (MISCELLANEOUS) ×5 IMPLANT
DRAPE X-RAY CASS 24X20 (DRAPES) ×5 IMPLANT
ELECT REM PT RETURN 9FT ADLT (ELECTROSURGICAL) ×10
ELECTRODE REM PT RTRN 9FT ADLT (ELECTROSURGICAL) ×6 IMPLANT
GAUZE SPONGE 4X4 12PLY STRL (GAUZE/BANDAGES/DRESSINGS) ×5 IMPLANT
GLOVE BIO SURGEON STRL SZ 6.5 (GLOVE) ×8 IMPLANT
GLOVE BIO SURGEONS STRL SZ 6.5 (GLOVE) ×2
GLOVE BIOGEL PI IND STRL 6 (GLOVE) ×3 IMPLANT
GLOVE BIOGEL PI IND STRL 6.5 (GLOVE) ×6 IMPLANT
GLOVE BIOGEL PI IND STRL 7.0 (GLOVE) ×9 IMPLANT
GLOVE BIOGEL PI INDICATOR 6 (GLOVE) ×2
GLOVE BIOGEL PI INDICATOR 6.5 (GLOVE) ×4
GLOVE BIOGEL PI INDICATOR 7.0 (GLOVE) ×6
GLOVE ECLIPSE 6.5 STRL STRAW (GLOVE) ×10 IMPLANT
GLOVE SS BIOGEL STRL SZ 7 (GLOVE) ×3 IMPLANT
GLOVE SUPERSENSE BIOGEL SZ 7 (GLOVE) ×2
GLOVE SURG SS PI 6.0 STRL IVOR (GLOVE) ×5 IMPLANT
GLOVE SURG SS PI 7.0 STRL IVOR (GLOVE) ×5 IMPLANT
GOWN STRL REUS W/ TWL LRG LVL3 (GOWN DISPOSABLE) ×18 IMPLANT
GOWN STRL REUS W/TWL LRG LVL3 (GOWN DISPOSABLE) ×12
KIT BASIN OR (CUSTOM PROCEDURE TRAY) ×5 IMPLANT
KIT ROOM TURNOVER OR (KITS) ×5 IMPLANT
LIQUID BAND (GAUZE/BANDAGES/DRESSINGS) ×10 IMPLANT
NS IRRIG 1000ML POUR BTL (IV SOLUTION) ×15 IMPLANT
PACK PERIPHERAL VASCULAR (CUSTOM PROCEDURE TRAY) ×5 IMPLANT
PAD ARMBOARD 7.5X6 YLW CONV (MISCELLANEOUS) ×10 IMPLANT
PATCH HEMASHIELD 8X75 (Vascular Products) ×5 IMPLANT
SET COLLECT BLD 21X3/4 12 (NEEDLE) ×5 IMPLANT
STAPLER VISISTAT 35W (STAPLE) ×5 IMPLANT
STOPCOCK 4 WAY LG BORE MALE ST (IV SETS) ×5 IMPLANT
SUT PROLENE 5 0 C1 (SUTURE) ×5 IMPLANT
SUT PROLENE 6 0 CC (SUTURE) ×15 IMPLANT
SUT SILK 2 0 SH (SUTURE) ×5 IMPLANT
SUT SILK 3 0 (SUTURE) ×2
SUT SILK 3-0 18XBRD TIE 12 (SUTURE) ×3 IMPLANT
SUT VIC AB 2-0 CTX 36 (SUTURE) ×10 IMPLANT
SUT VIC AB 3-0 SH 27 (SUTURE) ×10
SUT VIC AB 3-0 SH 27X BRD (SUTURE) ×15 IMPLANT
SUT VICRYL 4-0 PS2 18IN ABS (SUTURE) ×15 IMPLANT
TRAY FOLEY W/METER SILVER 16FR (SET/KITS/TRAYS/PACK) ×5 IMPLANT
TUBING EXTENTION W/L.L. (IV SETS) ×5 IMPLANT
UNDERPAD 30X30 INCONTINENT (UNDERPADS AND DIAPERS) ×5 IMPLANT
WATER STERILE IRR 1000ML POUR (IV SOLUTION) ×5 IMPLANT

## 2015-05-13 NOTE — Transfer of Care (Signed)
Immediate Anesthesia Transfer of Care Note  Patient: Hector Green  Procedure(s) Performed: Procedure(s): BYPASS GRAFT RIGHT FEMORAL-POSTERIOR TIBIAL ARTERY USING LEFT NONREVERSED TRANSLOCATED GREATER SAPPHENOUS VEIN (Right) LEFT GREATER SAPPHENOUS VEIN HARVEST (Left) RIGHT COMMON FEMORAL, SUPERFICIAL FEMORAL AND PROFUNDA ENDARTERECTOMY  (Right) RIGHT FEMORAL ARTERY PATCH ANGIOPLASTY (Right) RIGHT LOWER LEG INTRA OPERATIVE ARTERIOGRAM (Right)  Patient Location: PACU  Anesthesia Type:General  Level of Consciousness: sedated  Airway & Oxygen Therapy: Patient Spontanous Breathing and Patient connected to nasal cannula oxygen  Post-op Assessment: Report given to RN, Post -op Vital signs reviewed and stable and Patient moving all extremities X 4  Post vital signs: Reviewed and stable  Last Vitals:  Filed Vitals:   05/13/15 0530  BP: 147/73  Pulse: 94  Temp:   Resp: 19   HR 91, RR 22, Sats 97%, BP 173/94 Complications: No apparent anesthesia complications

## 2015-05-13 NOTE — Progress Notes (Signed)
Patient ID: Hector Green, male   DOB: 08-15-1961, 54 y.o.   MRN: [id number] Called to see patient in the PACU. Lost the signal is his right posterior tibial. Continues to have graft pulse. Physical exam his incisions are all without hematoma. Does have easily palpable subcutaneous femoral to posterior tibial graft pulse. No palpable posterior tibial pulse which is been present several hours ago and no posterior tibial Doppler flow. Discussed this with patient. Also discussed with Dr. Hart RochesterLawson. Dr. Hart RochesterLawson is out of town. Recommend the return to the operating room this evening for thrombectomy and possible revision. Patient understands and is willing to proceed. Have tried the several different locations to find the patient's family and been unsuccessful. Will find them after the surgical revision. For return to the operating room now.

## 2015-05-13 NOTE — Anesthesia Procedure Notes (Signed)
Procedure Name: Intubation Date/Time: 05/13/2015 8:32 AM Performed by: Merrilyn Puma B Pre-anesthesia Checklist: Patient identified, Timeout performed, Emergency Drugs available, Suction available and Patient being monitored Patient Re-evaluated:Patient Re-evaluated prior to inductionOxygen Delivery Method: Circle system utilized Preoxygenation: Pre-oxygenation with 100% oxygen Intubation Type: IV induction Ventilation: Mask ventilation without difficulty Laryngoscope Size: Mac and 4 Grade View: Grade I Tube type: Oral Tube size: 7.5 mm Number of attempts: 1 Airway Equipment and Method: Stylet and LTA kit utilized Placement Confirmation: CO2 detector,  positive ETCO2,  ETT inserted through vocal cords under direct vision and breath sounds checked- equal and bilateral Secured at: 23 cm Tube secured with: Tape Dental Injury: Teeth and Oropharynx as per pre-operative assessment

## 2015-05-13 NOTE — H&P (View-Only) (Signed)
  ESTABLISHED CRITICAL LIMB ISCHEMIA  History of Present Illness  Hector Green is a 53 y.o. (08/02/1961) male with uncontrolled diabetes who presents with chief complaint: severe right foot pain.  This patient was previously evaluated for >2 months of anesthesia in R lower leg and severe RLE rest pain.  His angiogram demonstrates extensive disease with anatomy amendable to a R Fem-PT bypass.  I admitted the patient post-angiogram for medical work-up and optimization.  He left AMA and was lost to follow despite attempts by my staff to track him down.  When he reappeared, he was sent for cardiac evaluation.  His cardiac evaluation demonstrated 30-44% EF and evidence of ischemia.  He recently underwent cardiac catheterization which demonstrated patent CABG bypass grafts.  At this point, the patient is ready to proceed with his bypass.  To date, he has NOT stopped smoking and has NOT start the insulin therapy previously recommended.  Past Medical History  Diagnosis Date  . CAD (coronary artery disease) 2011    Multivessel s/p CABG in [place]  . Essential hypertension   . Type 2 diabetes mellitus   . Hyperlipidemia   . Peripheral arterial disease   . Abnormal nuclear stress test 05/06/2015    Past Surgical History  Procedure Laterality Date  . Coronary artery bypass graft  2011    Three vessel by report  . Abdominal aortagram N/A 03/30/2015    Procedure: ABDOMINAL AORTAGRAM;  Surgeon: Aisling Emigh L Benny Henrie, MD;  Location: [facility] CATH LAB;  Service: Cardiovascular;  Laterality: N/A;  . Lower extremity angiogram N/A 03/30/2015    Procedure: LOWER EXTREMITY ANGIOGRAM;  Surgeon: Eppie Barhorst L Lashae Wollenberg, MD;  Location: [facility] CATH LAB;  Service: Cardiovascular;  Laterality: N/A;  . Cardiac catheterization N/A 05/06/2015    Procedure: Left Heart Cath and Cors/Grafts Angiography;  Surgeon: Michael Cooper, MD;  Location: [facility] INVASIVE CV LAB;  Service: Cardiovascular;  Laterality: N/A;    History   Social History  .  Marital Status: Single    Spouse Name: N/A  . Number of Children: N/A  . Years of Education: N/A   Occupational History  . Not on file.   Social History Main Topics  . Smoking status: Light Tobacco Smoker -- 0.50 packs/day for 15 years    Types: Cigarettes    Start date: 08/24/1975  . Smokeless tobacco: Never Used  . Alcohol Use: No  . Drug Use: No  . Sexual Activity: Not on file   Other Topics Concern  . Not on file   Social History Narrative    Family History  Problem Relation Age of Onset  . Cancer Father     Current Outpatient Prescriptions  Medication Sig Dispense Refill  . aspirin EC 81 MG tablet Take 81 mg by mouth daily.    . atorvastatin (LIPITOR) 80 MG tablet Take 1 tablet (80 mg total) by mouth daily. 90 tablet 3  . metFORMIN (GLUCOPHAGE) 1000 MG tablet Take by mouth daily.   0  . metoprolol tartrate (LOPRESSOR) 25 MG tablet Take 0.5 tablets (12.5 mg total) by mouth daily. 45 tablet 3  . metFORMIN (GLUCOPHAGE) 500 MG tablet Take 500 mg by mouth 2 (two) times daily with a meal.     . oxyCODONE-acetaminophen (PERCOCET/ROXICET) 5-325 MG per tablet Take 1 tablet by mouth every 4 (four) hours as needed for severe pain. 30 tablet 0   No current facility-administered medications for this visit.     Allergies  Allergen Reactions  . Fish-Derived   Products Anaphylaxis  . Penicillins Anaphylaxis    REVIEW OF SYSTEMS:  (Positives checked otherwise negative)  CARDIOVASCULAR:  [] chest pain, [] chest pressure, [] palpitations, [] shortness of breath when laying flat, [x] shortness of breath with exertion,  [x] pain in feet when walking, [x] pain in feet when laying flat, [] history of blood clot in veins (DVT), [] history of phlebitis, [] swelling in legs, [] varicose veins  PULMONARY:  [] productive cough, [] asthma, [] wheezing  NEUROLOGIC:  [x] weakness in arms or legs, [] numbness in arms or legs, [x] difficulty speaking or slurred speech, [] temporary loss of  vision in one eye, [] dizziness  HEMATOLOGIC:  [] bleeding problems, [] problems with blood clotting too easily  MUSCULOSKEL:  [] joint pain, [] joint swelling  GASTROINTEST:  [] vomiting blood, [] blood in stool     GENITOURINARY:  [] burning with urination, [] blood in urine  PSYCHIATRIC:  [] history of major depression  INTEGUMENTARY:  [] rashes, [] ulcers  CONSTITUTIONAL:  [] fever, [] chills  For VQI Use Only  PRE-ADM LIVING: Home  AMB STATUS: Ambulatory  CAD Sx: History of MI, but no symptoms No MI within 6 months  PRIOR CHF: None  STRESS TEST: [ ] No, [ ] Normal, [ ] + ischemia, [ ] + MI, [x] Both   Physical Examination  Filed Vitals:   05/08/15 0944  BP: 120/66  Pulse: 76  Temp: 97.9 F (36.6 C)  TempSrc: Oral  Resp: 16  Height: 5' 9.5" (1.765 m)  Weight: 189 lb (85.73 kg)  SpO2: 99%    Body mass index is 27.52 kg/(m^2).  General: A&O x 3, WDWN  Head: Novelty/AT  Ear/Nose/Throat: Hearing grossly intact, nares w/o erythema or drainage, oropharynx w/o Erythema/Exudate, Mallampati score: 3  Eyes: PERRLA, EOMI  Neck: Supple, no nuchal rigidity, no palpable LAD  Pulmonary: Sym exp, good air movt, CTAB, no rales, rhonchi, & wheezing  Cardiac: RRR, Nl S1, S2, no Murmurs, rubs or gallops  Vascular: Vessel Right Left  Radial Palpable Palpable  Brachial Palpable Palpable  Carotid Palpable, without bruit Palpable, without bruit  Aorta Not palpable N/A  Femoral Palpable Palpable  Popliteal Not palpable Not palpable  PT Not  Palpable Not Palpable  DP Not Palpable Not Palpable   Gastrointestinal: soft, NTND, -G/R, - HSM, - masses, - CVAT B  Musculoskeletal: M/S 5/5 throughout , Extremities without ischemic changes   Neurologic: CN 2-12 intact , Pain and light touch intact in extremities , Motor exam as listed above  Psychiatric: Judgment intact, Mood & affect appropriate for pt's clinical situation  Dermatologic: See M/S exam for extremity exam, no  rashes otherwise noted  Lymph : No Cervical, Axillary, or Inguinal lymphadenopathy    Non-Invasive Vascular Imaging   BLE GSV Mapping (Date: 05/08/2015)  RLE: inadequate conduit  LLE: adequate conduit   Nuclear stressing testing (04/27/15)  There was no ST segment deviation noted during stress.  Findings consistent with ischemia and prior myocardial infarction.  This is a high risk study. High risk due to low ejection fraction and large area of ischemia.  The left ventricular ejection fraction is moderately decreased (30-44%).  There is a large inferior and inferolateral infarct  There is a large area of iscehmia in the anterolateral wall  There is borderline elevation of TID ratio (1.22)  Cardiac catheterization (05/06/15) 1. Severe native three-vessel coronary artery disease with total occlusion of the proximal RCA, total occlusion   of the proximal left circumflex, and total occlusion of the LAD after the first septal perforator 2. Status post aortocoronary bypass surgery with continued patency of the LIMA to LAD, saphenous vein graft to intermediate, and 7 his vein graft to right PDA 3. Total occlusion of the AV groove circumflex collateralized from the saphenous vein graft to intermediate 4. Moderately severe segmental left ventricular systolic dysfunction with LVEF estimated at 35%  Discussion: The patient has continued patency of his bypass grafts. He may have ischemia in the distribution of the second and third obtuse marginal branches which are collateralized. Otherwise, he has what appears to be normal perfusion to the LAD, diagonal/intermediate, and RCA territories based on patency of all bypass grafts. I suspect he will be able to proceed with surgery and there does not appear to be any indication for further coronary revascularization.  Laboratory: CBC:    Component Value Date/Time   WBC 9.5 04/29/2015 1427   RBC 5.50 04/29/2015 1427   HGB 16.6 04/29/2015 1427    HCT 47.1 04/29/2015 1427   PLT 160 04/29/2015 1427   MCV 85.6 04/29/2015 1427   MCH 30.2 04/29/2015 1427   MCHC 35.2 04/29/2015 1427   RDW 12.7 04/29/2015 1427   LYMPHSABS 2.6 04/29/2015 1427   MONOABS 0.7 04/29/2015 1427   EOSABS 0.3 04/29/2015 1427   BASOSABS 0.0 04/29/2015 1427    BMP:    Component Value Date/Time   NA 132* 04/29/2015 1427   K 4.2 04/29/2015 1427   CL 99 04/29/2015 1427   CO2 22 04/29/2015 1427   GLUCOSE 440* 04/29/2015 1427   BUN 20 04/29/2015 1427   CREATININE 0.96 04/29/2015 1427   CREATININE 0.80 03/30/2015 1055   CALCIUM 9.3 04/29/2015 1427    Coagulation: Lab Results  Component Value Date   INR 0.87 04/29/2015   No results found for: PTT    Medical Decision Making  Hector Green is a 53 y.o. male who presents with: RLE CLI with rest pain, CAD, uncontrolled DM   Unfortunately, patient sabotaged his own care by leaving AMA, so his cardiac evaluation was delayed, so now that he has been cleared I don't have OR time to get him on the schedule for the coming week due to the Memorial Day holiday and already filled OR/PV schedule.  Will see if Dr. Lawson can schedule him for this coming Wednesday for R CFA to PT bypass with L GSV. The risk, benefits, and alternative for bypass operations were discussed with the patient.  The patient is aware the risks include but are not limited to: bleeding, infection, myocardial infarction, stroke, limb loss, nerve damage, need for additional procedures in the future, wound complications, and inability to complete the bypass.   The patient is aware that his CAD and uncontrolled DM will put him at higher risk of mortality (2%) and morbidity (30%). The patient is aware of these risks and agreed to proceed.   I emphasized to him the importance of compliance with his DM regimen.  This patient needs to be started on a insulin regimen to get him under control.  This complicated by his reported fear of needles despite  having multiple tattoos on his arms and legs. Will check with Dr. Lawson if he is willing to take the case.  Badr Piedra, MD [facility] [place] Office: [phone] Pager: [phone]  05/08/2015, 12:38 PM   

## 2015-05-13 NOTE — Op Note (Signed)
OPERATIVE REPORT  Date of Surgery: 05/12/2015 - 05/13/2015  Surgeon: Josephina Gip, MD  Assistant: Doreatha Massed  PA  Pre-op Diagnosis: Critical Limb Ischemia right leg I99.9  Post-op Diagnosis: Critical Limb Ischemia right leg I99.9  Procedure: Procedure(s): BYPASS GRAFT RIGHT FEMORAL-POSTERIOR TIBIAL ARTERY USING LEFT NONREVERSED TRANSLOCATED GREATER SAPPHENOUS VEIN LEFT GREATER SAPPHENOUS VEIN HARVEST RIGHT COMMON FEMORAL, SUPERFICIAL FEMORAL AND PROFUNDA ENDARTERECTOMY  RIGHT FEMORAL ARTERY PATCH ANGIOPLASTY RIGHT LOWER LEG INTRA OPERATIVE ARTERIOGRAM  Anesthesia: General  EBL: 200 cc  Complications: None  Procedure Details: The patient was taken to the operating room placed in supine position at which time satisfactory general endotracheal anesthesia was measured. Both lower extremities were prepped with Betadine scrub and solution draped in routine sterile manner. Saphenous vein was then removed from the left leg from the inguinal area to the distal calf through multiple incisions along the medial aspect of the left leg. Saphenous vein was mobilized its branches were ligated with 3 and 4-0 silk ties and divided it was ligated distally transected and ligated at saphenofemoral junction. It was gently dilated with heparinized saline and marked for orientation purposes it was a satisfactory vein being at least 2-2-1/2 mm in smallest areas. Attention turned to the right leg which was the ischemic leg. A short longitudinal incision was made in the inguinal area the common superficial femoral profunda femoris arteries were dissected free. There was diffuse calcific atherosclerosis involving all vessels with a large posterior plaque in the common femoral artery causing about a 50% stenosis. This extended well up above the inguinal ligament which necessitated exposure of the external iliac artery proximally. The profundus and superficial femoral arteries were dissected free distally. Medial  incision was made in the proximal aspect of the calf carried down through subcutaneous tissues and the posterior tibial neurovascular bundle was identified in its proximal third. It was diseased proximally but then became soft and was dissected free and encircled with Vesseloops and was patent to the ankle on the angiogram. A subcutaneous tunnel was then created on the medial aspect of the leg the patient was heparinized. Femoral vessels were occluded with vascular clamps of the external iliac being occluded proximally. Longitudinal opening made in the common femoral and the plaque was causing at least a 50-70% stenosis. This necessitated an endarterectomy up to the clamp which was in the external iliac artery proximally. The plaque feathered off the distal profunda fairly nicely although the plaque did extend well down the profunda and also extended down the superficial femoral artery indefinitely. After the endarterectomy was completed there was backbleeding from the profunda and the superficial femoral artery L loose debris carefully removed a Dacron patch sewn into place using continuous 50 proline. Following completion of this longitudinal opening made in the patch proximal end of the saphenous vein graft was spatulated and anastomosed in this eye with 60 proline. Clamps were all then released there was excellent pulse down to the first set of competent valves. Using a retrograde valvulotome the valves were rendered incompetent with resultant excellent flow at the distal end vein graft. Vein was then delivered through the subcutaneous medial tunnel into the distal wound and the posterior tibial artery occluded proximal and distally with Vesseloops 7:15 blade extended with Potts scissors. The artery was normal at this point and would accept a 3 dilator distally. Vein was carefully measured spatulated anastomosis inside 60 proline. Vesseloops and released there was excellent pulse and excellent Doppler flow in  the posterior tibial artery and a palpable  posterior tibial pulse at the ankle. Intraoperative arteriogram was performed which revealed a widely patent anastomosis with a borderline size vein into the posterior tibial artery with good runoff distally through the posterior tibial artery. There was also good Doppler flow in the profunda. No protamine was given because of a fish allergy. The wounds were irrigated with saline and adequate hemostasis achieved and wounds closed in layers with Vicryls and subcutaneous tic or fashion with Dermabond patient taken to recovery room in stable condition Josephina GipJames Lawson, MD 05/13/2015 12:43 PM

## 2015-05-13 NOTE — Anesthesia Postprocedure Evaluation (Signed)
  Anesthesia Post-op Note  Patient: Hector Green  Procedure(s) Performed: Procedure(s): THROMBECTOMY RIGHT FEMORAL-PROXIMAL POSTERIOR TIBAIL ARTERY BYPASS GRAFT (Right)  Patient Location: PACU  Anesthesia Type:General  Level of Consciousness: awake and alert   Airway and Oxygen Therapy: Patient Spontanous Breathing  Post-op Pain: none  Post-op Assessment: Post-op Vital signs reviewed, Patient's Cardiovascular Status Stable and Respiratory Function Stable  Post-op Vital Signs: Reviewed  Filed Vitals:   05/13/15 2049  BP:   Pulse:   Temp: 36.6 C  Resp:     Complications: No apparent anesthesia complications

## 2015-05-13 NOTE — Op Note (Signed)
    OPERATIVE REPORT  DATE OF SURGERY: 05/13/2015  PATIENT: Hector Green, 54 y.o. male MRN: [id number]  DOB: [birth year]  PRE-OPERATIVE DIAGNOSIS: Acute occlusion of right femoral to posterior tibial bypass  POST-OPERATIVE DIAGNOSIS:  Same  PROCEDURE: Thrombectomy of right femoral to posterior tibial bypass  SURGEON:  Gretta Beganodd Shanetra Blumenstock, M.D.  PHYSICIAN ASSISTANT: Trinh  ANESTHESIA:  Gen.  EBL: 100 ml  Total I/O In: 1200 [I.V.:1200] Out: 375 [Urine:275; Blood:100]  BLOOD ADMINISTERED: None  DRAINS: None  SPECIMEN: None  COUNTS CORRECT:  YES  PLAN OF CARE: PACU   PATIENT DISPOSITION:  PACU - hemodynamically stable  PROCEDURE DETAILS: Patient underwent right femoral to posterior tibial bypass with vein from left leg earlier today. He did well and was in the PACU for several hours and then returned to the floor. Was noted to have loss of pulse which had been present earlier in the afternoon. Had no Doppler flow at the posterior tibial artery. I did have palpable graft pulse at the knee. Was recommended he be return the operating room for presumed graft thrombosis.  The patient was taken to the operative placed supine position where the area of the right groin and right leg were prepped and draped in usual sterile fashion. The medial popliteal incision was reopened. There was a pulse in the graft at the level of the knee but clearly there was clot in the vein below this. The patient was given 7000 units intravenous heparin. After adequate circulation time the hood of the vein graft to the posterior tibial artery was opened longitudinally. There was clot present and this was removed directly. There was clot in the posterior tibial arteries well. A 3 Fogarty catheter was passed through the anastomosis distally there was some irregularity in the posterior tibial artery but the catheter passed to the ankle. There was a thrombus removed from the proximal portion of the artery there was excellent  backbleeding. Second negative pass was taken. The posterior tibial artery was occluded with the Serafin clamp. Next the Fogarty catheter was passed retrograde up the posterior tibial artery to the anastomosis and again a slight amount of thrombus was removed from the artery at the anastomosis and again there was good backbleeding. This also was occluded with Serafin clamp. Finally the vein was thrombectomized. The Fogarty catheter passed easily to the level of the groin and there was a thrombus removed from the distal 4-5 cm of a vein graft. There was excellent flow through the graft. The arterial anastomosis was of visually inspected and was widely patent with no evidence of technical difficulty. The intraoperative arteriogram from earlier was reviewed showing excellent flow to the posterior tibial artery. There was moderate disease in the more distal posterior tibial artery. The incision and the vein was closed with a running 60 proline suture. Several additional sutures were required from stasis. The heparin was not reversed. The patient had good posterior tibial signal at the ankle. Wounds were irrigated with saline. Hemostasis tablet cautery. The wounds were closed with 2-0 Monocryl the fascia and 3-0 Monocryl in subcuticular tissue. Benzoin Steri-Strips were applied. The patient was transferred to the recovery room in stable condition   Gretta Beganodd Clair Bardwell, M.D. 05/13/2015 8:58 PM

## 2015-05-13 NOTE — Transfer of Care (Signed)
Immediate Anesthesia Transfer of Care Note  Patient: Hector Green  Procedure(s) Performed: Procedure(s): THROMBECTOMY RIGHT FEMORAL-PROXIMAL POSTERIOR TIBAIL ARTERY BYPASS GRAFT (Right)  Patient Location: PACU  Anesthesia Type:General  Level of Consciousness: awake, oriented and patient cooperative  Airway & Oxygen Therapy: Patient Spontanous Breathing and Patient connected to nasal cannula oxygen  Post-op Assessment: Report given to RN and Post -op Vital signs reviewed and stable  Post vital signs: Reviewed and stable  Last Vitals:  Filed Vitals:   05/13/15 1854  BP: 159/59  Pulse: 70  Temp:   Resp: 18    Complications: No apparent anesthesia complications

## 2015-05-13 NOTE — Anesthesia Preprocedure Evaluation (Addendum)
Anesthesia Evaluation  Patient identified by MRN, date of birth, ID band Patient awake    Reviewed: Allergy & Precautions, H&P , NPO status , Patient's Chart, lab work & pertinent test results, reviewed documented beta blocker date and time   Airway Mallampati: II  TM Distance: >3 FB Neck ROM: Full    Dental no notable dental hx. (+) Poor Dentition, Dental Advisory Given   Pulmonary Current Smoker,  breath sounds clear to auscultation  Pulmonary exam normal       Cardiovascular hypertension, Pt. on medications and Pt. on home beta blockers + CAD, + CABG and + Peripheral Vascular Disease Rhythm:Regular Rate:Normal     Neuro/Psych negative neurological ROS  negative psych ROS   GI/Hepatic negative GI ROS, Neg liver ROS,   Endo/Other  diabetes, Type 2, Oral Hypoglycemic Agents  Renal/GU negative Renal ROS  negative genitourinary   Musculoskeletal   Abdominal   Peds  Hematology negative hematology ROS (+)   Anesthesia Other Findings   Reproductive/Obstetrics negative OB ROS                            Anesthesia Physical Anesthesia Plan  ASA: III and emergent  Anesthesia Plan: General   Post-op Pain Management:    Induction: Intravenous  Airway Management Planned: Oral ETT  Additional Equipment:   Intra-op Plan:   Post-operative Plan: Extubation in OR  Informed Consent: I have reviewed the patients History and Physical, chart, labs and discussed the procedure including the risks, benefits and alternatives for the proposed anesthesia with the patient or authorized representative who has indicated his/her understanding and acceptance.   Dental advisory given  Plan Discussed with: CRNA  Anesthesia Plan Comments:         Anesthesia Quick Evaluation

## 2015-05-13 NOTE — Progress Notes (Signed)
Inpatient Diabetes Program Recommendations  AACE/ADA: New Consensus Statement on Inpatient Glycemic Control (2013)  Target Ranges:  Prepandial:   less than 140 mg/dL      Peak postprandial:   less than 180 mg/dL (1-2 hours)      Critically ill patients:  140 - 180 mg/dL  Results for Hector Green, Hector Green (MRN [id number]) as of 05/13/2015 09:11  Ref. Range 05/12/2015 22:06 05/12/2015 23:07 05/13/2015 00:13 05/13/2015 01:11 05/13/2015 05:31  Glucose-Capillary Latest Ref Range: 65-99 mg/dL 784149 (H) 696173 (H) 295135 (H) 138 (H) 175 (H)   Inpatient Diabetes Program Recommendations Insulin - Basal: consider adding Lantus 10 units  Correction (SSI): continue Novolog mod Q4 while NPO HgbA1C: order to assess prehospital glucose control Thank you  Piedad ClimesGina Sila Sarsfield BSN, RN,CDE Inpatient Diabetes Coordinator [phone] (team pager)

## 2015-05-13 NOTE — Progress Notes (Signed)
Pt arrived from PACU, upon checking pulses it was noted that the R DP couldn't be found and toes showed mottling. Paged Dr. Arbie CookeyEarly who was on call, to notify of findings. RN told to page Dr. Hart RochesterLawson. No return call from Dr. Hart RochesterLawson. Rechecked pulses to find R PT now absent. Both PT and DP on the R foot absent and toes now cool.  Aneta MinsPhillip RN in PACU took patient back to PACU for further assessment.

## 2015-05-13 NOTE — Interval H&P Note (Signed)
History and Physical Interval Note:  05/13/2015 7:47 AM  Hector SalmonJohn W Green  has presented today for surgery, with the diagnosis of Critical Limb Ischemia right leg I99.9  The various methods of treatment have been discussed with the patient and family. After consideration of risks, benefits and other options for treatment, the patient has consented to  Procedure(s): BYPASS GRAFT RIGHT FEMORAL-PROXIMAL POSTERIOR TIBIAL ARTERY WITH LEFT GREATER SAPHENOUS VEIN (Right) as a surgical intervention .  The patient's history has been reviewed, patient examined, no change in status, stable for surgery.  I have reviewed the patient's chart and labs.  Questions were answered to the patient's satisfaction.     Josephina GipLawson, Weylin Plagge

## 2015-05-13 NOTE — Anesthesia Procedure Notes (Signed)
Procedure Name: Intubation Date/Time: 05/13/2015 7:15 PM Performed by: Julianne RiceBILOTTA, Hector Aken Z Pre-anesthesia Checklist: Patient identified, Timeout performed, Emergency Drugs available, Suction available and Patient being monitored Patient Re-evaluated:Patient Re-evaluated prior to inductionOxygen Delivery Method: Circle system utilized Preoxygenation: Pre-oxygenation with 100% oxygen Intubation Type: IV induction Ventilation: Mask ventilation without difficulty Laryngoscope Size: Mac and 4 Grade View: Grade I Tube type: Oral Tube size: 7.5 mm Number of attempts: 1 Airway Equipment and Method: Stylet Placement Confirmation: ETT inserted through vocal cords under direct vision,  breath sounds checked- equal and bilateral and positive ETCO2 Secured at: 22 cm Tube secured with: Tape Dental Injury: Teeth and Oropharynx as per pre-operative assessment

## 2015-05-14 ENCOUNTER — Encounter (HOSPITAL_COMMUNITY): Payer: Self-pay | Admitting: Vascular Surgery

## 2015-05-14 ENCOUNTER — Inpatient Hospital Stay (HOSPITAL_COMMUNITY): Payer: Medicare Other

## 2015-05-14 DIAGNOSIS — Z9889 Other specified postprocedural states: Secondary | ICD-10-CM

## 2015-05-14 LAB — GLUCOSE, CAPILLARY
GLUCOSE-CAPILLARY: 178 mg/dL — AB (ref 65–99)
GLUCOSE-CAPILLARY: 185 mg/dL — AB (ref 65–99)
GLUCOSE-CAPILLARY: 223 mg/dL — AB (ref 65–99)
GLUCOSE-CAPILLARY: 226 mg/dL — AB (ref 65–99)
GLUCOSE-CAPILLARY: 254 mg/dL — AB (ref 65–99)
Glucose-Capillary: 209 mg/dL — ABNORMAL HIGH (ref 65–99)
Glucose-Capillary: 246 mg/dL — ABNORMAL HIGH (ref 65–99)
Glucose-Capillary: 243 mg/dL — ABNORMAL HIGH (ref 65–99)

## 2015-05-14 LAB — CBC
HCT: 39.9 % (ref 39.0–52.0)
HEMOGLOBIN: 13.6 g/dL (ref 13.0–17.0)
MCH: 28.9 pg (ref 26.0–34.0)
MCHC: 34.1 g/dL (ref 30.0–36.0)
MCV: 84.7 fL (ref 78.0–100.0)
PLATELETS: 116 10*3/uL — AB (ref 150–400)
RBC: 4.71 MIL/uL (ref 4.22–5.81)
RDW: 12.5 % (ref 11.5–15.5)
WBC: 10.9 10*3/uL — AB (ref 4.0–10.5)

## 2015-05-14 LAB — BASIC METABOLIC PANEL
Anion gap: 9 (ref 5–15)
BUN: 13 mg/dL (ref 6–20)
CO2: 23 mmol/L (ref 22–32)
Calcium: 8.5 mg/dL — ABNORMAL LOW (ref 8.9–10.3)
Chloride: 103 mmol/L (ref 101–111)
Creatinine, Ser: 0.91 mg/dL (ref 0.61–1.24)
GFR calc non Af Amer: >60 mL/min (ref >60)
Glucose, Bld: 235 mg/dL — ABNORMAL HIGH (ref 65–99)
POTASSIUM: 4.4 mmol/L (ref 3.5–5.1)
SODIUM: 135 mmol/L (ref 135–145)

## 2015-05-14 LAB — HEPARIN LEVEL (UNFRACTIONATED)

## 2015-05-14 MED ORDER — HEPARIN SODIUM (PORCINE) 5000 UNIT/ML IJ SOLN
5000.0000 [IU] | Freq: Three times a day (TID) | INTRAMUSCULAR | Status: DC
  Administered 2015-05-15: 5000 [IU] via SUBCUTANEOUS
  Filled 2015-05-14 (×4): qty 1

## 2015-05-14 MED ORDER — METFORMIN HCL 500 MG PO TABS
1000.0000 mg | ORAL_TABLET | Freq: Two times a day (BID) | ORAL | Status: DC
  Administered 2015-05-14 – 2015-05-15 (×2): 1000 mg via ORAL
  Filled 2015-05-14 (×4): qty 2

## 2015-05-14 MED ORDER — INSULIN GLARGINE 100 UNIT/ML ~~LOC~~ SOLN
10.0000 [IU] | Freq: Every day | SUBCUTANEOUS | Status: DC
  Administered 2015-05-14: 10 [IU] via SUBCUTANEOUS
  Filled 2015-05-14 (×2): qty 0.1

## 2015-05-14 NOTE — Care Management Note (Addendum)
Case Management Note  Patient Details  Name: Chales SalmonJohn W Cropp MRN: [id number] Date of Birth: 1961/02/15  Subjective/Objective:                    Pt  from home admitted with PAD, s/p thrombectomy of right femoral to posterior tibial bypass.   Action/Plan:  Return to home when medically stable. CM to f/u with  d/c disposition. Expected Discharge Date:                  Expected Discharge Plan:  Home/Self Care  In-House Referral:     Discharge planning Services  CM Consult  Post Acute Care Choice:    Choice offered to:     DME Arranged:    DME Agency:     HH Arranged:    HH Agency:     Status of Service:  In process, will continue to follow  Medicare Important Message Given:    Date Medicare IM Given:    Medicare IM give by:    Date Additional Medicare IM Given:    Additional Medicare Important Message give by:     If discussed at Long Length of Stay Meetings, dates discussed:    Additional Comments: Arline AspCindy (wife) [phone]  Epifanio LeschesCole, Eleno Weimar Hudson, RN 05/14/2015, 10:47 AM

## 2015-05-14 NOTE — Anesthesia Preprocedure Evaluation (Addendum)
Anesthesia Evaluation  Patient identified by MRN, date of birth, ID band Patient awake    Reviewed: Allergy & Precautions, NPO status , Patient's Chart, lab work & pertinent test results  Airway Mallampati: I       Dental   Pulmonary Current Smoker,    Pulmonary exam normal       Cardiovascular hypertension, + CAD and + Peripheral Vascular Disease Normal cardiovascular exam    Neuro/Psych    GI/Hepatic   Endo/Other  diabetes, Type 2  Renal/GU      Musculoskeletal   Abdominal   Peds  Hematology   Anesthesia Other Findings   Reproductive/Obstetrics                            Anesthesia Physical Anesthesia Plan  ASA: III  Anesthesia Plan: General   Post-op Pain Management:    Induction: Intravenous  Airway Management Planned: Oral ETT  Additional Equipment:   Intra-op Plan:   Post-operative Plan:   Informed Consent: I have reviewed the patients History and Physical, chart, labs and discussed the procedure including the risks, benefits and alternatives for the proposed anesthesia with the patient or authorized representative who has indicated his/her understanding and acceptance.     Plan Discussed with: CRNA, Anesthesiologist and Surgeon  Anesthesia Plan Comments:        Anesthesia Quick Evaluation

## 2015-05-14 NOTE — Progress Notes (Signed)
Inpatient Diabetes Program Recommendations  AACE/ADA: New Consensus Statement on Inpatient Glycemic Control (2013)  Target Ranges:  Prepandial:   less than 140 mg/dL      Peak postprandial:   less than 180 mg/dL (1-2 hours)      Critically ill patients:  140 - 180 mg/dL   Inpatient Diabetes Program Recommendations Insulin - Basal: consider adding Lantus 10 units  Correction (SSI): Change Novolog to Moderate scale TID + HS scale  HgbA1C: order to assess prehospital glucose control Thank you  Piedad ClimesGina Darlin Stenseth BSN, RN,CDE Inpatient Diabetes Coordinator [phone] (team pager)

## 2015-05-14 NOTE — Progress Notes (Signed)
Report called to Gabe, receiving RN on  2 west. VSS. Transferred to 2W31 via wheelchair with personal belongings. Called patient's family member with new room number.Fulton Mole.  Sanvi Ehler,RN

## 2015-05-14 NOTE — Progress Notes (Signed)
PT Cancellation Note  Patient Details Name: Hector Green MRN: [id number] DOB: 1961-11-13   Cancelled Treatment:    Reason Eval/Treat Not Completed: PT screened, no needs identified, will sign off.  Per OT no further needs for PT to address.  OT will recommend all necessary equipment.  Will sign off at this time.  05/14/2015  Secretary Hector Green, PT [phone]  (pager)   Hector Green, Hector Green 05/14/2015, 2:15 PM

## 2015-05-14 NOTE — Progress Notes (Signed)
VASCULAR LAB PRELIMINARY  ARTERIAL  ABI completed: Mild arterial insufficiency to the right lower extremity at rest and moderate arterial insufficiency to the left lower extremity at rest.    RIGHT    LEFT    PRESSURE WAVEFORM  PRESSURE WAVEFORM  BRACHIAL 120  BRACHIAL 131   DP 70  DP 82   PT 109  PT 79     RIGHT LEFT  ABI 0.83 0.63     Hector Green D, RVT 05/14/2015, 4:43 PM

## 2015-05-14 NOTE — Anesthesia Postprocedure Evaluation (Signed)
  Anesthesia Post-op Note  Patient: Hector Green  Procedure(s) Performed: Procedure(s): BYPASS GRAFT RIGHT FEMORAL-POSTERIOR TIBIAL ARTERY USING LEFT NONREVERSED TRANSLOCATED GREATER SAPPHENOUS VEIN (Right) LEFT GREATER SAPPHENOUS VEIN HARVEST (Left) RIGHT COMMON FEMORAL, SUPERFICIAL FEMORAL AND PROFUNDA ENDARTERECTOMY  (Right) RIGHT FEMORAL ARTERY PATCH ANGIOPLASTY (Right) RIGHT LOWER LEG INTRA OPERATIVE ARTERIOGRAM (Right)  Patient Location: PACU  Anesthesia Type:General  Level of Consciousness: awake, alert , oriented and patient cooperative  Airway and Oxygen Therapy: Patient Spontanous Breathing  Post-op Pain: mild  Post-op Assessment: Post-op Vital signs reviewed, Patient's Cardiovascular Status Stable, Respiratory Function Stable, Patent Airway and No signs of Nausea or vomiting  Post-op Vital Signs: stable  Last Vitals:  Filed Vitals:   05/14/15 0814  BP:   Pulse:   Temp: 36.6 C  Resp:     Complications: No apparent anesthesia complications

## 2015-05-14 NOTE — Evaluation (Signed)
Occupational Therapy Evaluation Patient Details Name: Hector Green MRN: [id number] DOB: May 26, 1961 Today's Date: 05/14/2015    History of Present Illness This 54 y.o. male admitted for BYPASS GRAFT RIGHT FEMORAL-PROXIMAL POSTERIOR TIBIAL ARTERY WITH LEFT GREATER SAPHENOUS VEIN.  PMH includes:  CAD, HTN, DM, PAD, s/p CABG   Clinical Impression   Pt admitted with above.  He is doing very well POD#1.   He requires set up assist with ADLs and supervision for ADLs due to lines only.  As soon as lines discharged, pt will be safe to be modified independent.  He is eager to discharge home, and does not need further OT.      Follow Up Recommendations  No OT follow up    Equipment Recommendations  None recommended by OT    Recommendations for Other Services       Precautions / Restrictions Precautions Precautions: None      Mobility Bed Mobility               General bed mobility comments: in chair   Transfers Overall transfer level: Modified independent Equipment used: Rolling walker (2 wheeled)                  Balance Overall balance assessment: No apparent balance deficits (not formally assessed)                                          ADL                                         General ADL Comments: Pt requires assist set up assist for ADLs and supervision for funcitonal mobility due to lines only.  Once lines discharged, pt will be mod I .  He is able to simulate tub transfer mod I with unilateral UE support      Vision     Perception     Praxis      Pertinent Vitals/Pain Pain Assessment: 0-10 Pain Score: 2  Pain Location: Rt groin Pain Descriptors / Indicators: Other (Comment) (stinging) Pain Intervention(s): Monitored during session     Hand Dominance     Extremity/Trunk Assessment Upper Extremity Assessment Upper Extremity Assessment: Overall WFL for tasks assessed   Lower Extremity Assessment Lower  Extremity Assessment: RLE deficits/detail RLE Deficits / Details: limited by pain    Cervical / Trunk Assessment Cervical / Trunk Assessment: Normal   Communication Communication Communication: No difficulties   Cognition Arousal/Alertness: Awake/alert Behavior During Therapy: WFL for tasks assessed/performed Overall Cognitive Status: Within Functional Limits for tasks assessed                     General Comments       Exercises       Shoulder Instructions      Home Living Family/patient expects to be discharged to:: Private residence Living Arrangements: Spouse/significant other;Other relatives;Children Available Help at Discharge: Family;Available PRN/intermittently Type of Home: House Home Access: Stairs to enter [employer] of Steps: 2 Entrance Stairs-Rails: Right Home Layout: One level     Bathroom Shower/Tub: Tub/shower unit;Curtain Shower/tub characteristics: Engineer, building services: Standard     Home Equipment: None          Prior Functioning/Environment Level of Independence: Independent  OT Diagnosis: Acute pain   OT Problem List:     OT Treatment/Interventions:      OT Goals(Current goals can be found in the care plan section) Acute Rehab OT Goals OT Goal Formulation: All assessment and education complete, DC therapy  OT Frequency:     Barriers to D/C:            Co-evaluation              End of Session Equipment Utilized During Treatment: Rolling walker Nurse Communication: Mobility status  Activity Tolerance: Patient tolerated treatment well Patient left: in chair;with call bell/phone within reach   Time: [id number] OT Time Calculation (min): 16 min Charges:  OT General Charges $OT Visit: 1 Procedure OT Evaluation $Initial OT Evaluation Tier I: 1 Procedure G-Codes:    Jonnell Hentges, Ursula AlertWendi M 05/14/2015, 2:27 PM

## 2015-05-14 NOTE — Progress Notes (Signed)
UR COMPLETED  

## 2015-05-14 NOTE — Progress Notes (Signed)
[facility]  Subjective  - Some soreness   Objective 132/80 110 97.8 F (36.6 C) (Oral) 30 99%  Intake/Output Summary (Last 24 hours) at 05/14/15 [id number] Last data filed at 05/14/15 0600  Gross per 24 hour  Intake 4703.03 ml  Output   2435 ml  Net 2268.03 ml   2+ PT pulse left  Assessment/Planning: S/p thrombectomy last night after PT bypass earlier in day Transfer to 2w ambulate Pt wants to go home tomorrow. Will see how he  does today.  Fabienne BrunsFields, Bailea Beed 05/14/2015 9:06 AM --  Laboratory Lab Results:  Recent Labs  05/13/15 0547 05/14/15 0246  WBC 9.0 10.9*  HGB 14.9 13.6  HCT 43.5 39.9  PLT 125* 116*   BMET  Recent Labs  05/13/15 0547 05/14/15 0246  NA 137 135  K 5.3* 4.4  CL 105 103  CO2 25 23  GLUCOSE 177* 235*  BUN 17 13  CREATININE 1.02 0.91  CALCIUM 9.0 8.5*    COAG Lab Results  Component Value Date   INR 0.96 05/13/2015   INR 1.01 05/13/2015   INR 1.04 05/12/2015   No results found for: PTT

## 2015-05-15 LAB — TYPE AND SCREEN
ABO/RH(D): A POS
Antibody Screen: NEGATIVE
UNIT DIVISION: 0
Unit division: 0

## 2015-05-15 LAB — HEMOGLOBIN A1C
Hgb A1c MFr Bld: 10.9 % — ABNORMAL HIGH (ref 4.8–5.6)
MEAN PLASMA GLUCOSE: 266 mg/dL

## 2015-05-15 LAB — GLUCOSE, CAPILLARY
GLUCOSE-CAPILLARY: 150 mg/dL — AB (ref 65–99)
GLUCOSE-CAPILLARY: 150 mg/dL — AB (ref 65–99)

## 2015-05-15 MED ORDER — OXYCODONE HCL 5 MG PO TABS: 5.0000 mg | ORAL_TABLET | Freq: Four times a day (QID) | ORAL | Status: DC | PRN

## 2015-05-15 NOTE — Discharge Summary (Signed)
Discharge Summary     Hector Green [birth year] 54 y.o. male  [id number]  Admission Date: 05/12/2015  Discharge Date: 05/15/15  Physician: Pryor Ochoa, MD  Admission Diagnosis: Critical Limb Ischemia right leg I99.9 right cold leg   HPI:   This is a 54 y.o. male with uncontrolled diabetes who presents with chief complaint: severe right foot pain. This patient was previously evaluated for >2 months of anesthesia in R lower leg and severe RLE rest pain. His angiogram demonstrates extensive disease with anatomy amendable to a R Fem-PT bypass. I admitted the patient post-angiogram for medical work-up and optimization. He left AMA and was lost to follow despite attempts by my staff to track him down. When he reappeared, he was sent for cardiac evaluation. His cardiac evaluation demonstrated 30-44% EF and evidence of ischemia. He recently underwent cardiac catheterization which demonstrated patent CABG bypass grafts. At this point, the patient is ready to proceed with his bypass. To date, he has NOT stopped smoking and has NOT start the insulin therapy previously recommended.  Hospital Course:  The patient was admitted to the hospital and taken to the operating room on 05/12/2015 - 05/13/2015 and underwent:   BYPASS GRAFT RIGHT FEMORAL-POSTERIOR TIBIAL ARTERY USING LEFT NONREVERSED TRANSLOCATED GREATER SAPPHENOUS VEIN LEFT GREATER SAPPHENOUS VEIN HARVEST RIGHT COMMON FEMORAL, SUPERFICIAL FEMORAL AND PROFUNDA ENDARTERECTOMY  RIGHT FEMORAL ARTERY PATCH ANGIOPLASTY RIGHT LOWER LEG INTRA OPERATIVE ARTERIOGRAM    The pt tolerated the procedure well and was transported to the PACU in good condition.   Later that day, his signals were lost in his foot and he was taken back to the operating room where he underwent: Thrombectomy of right femoral to posterior tibial bypass  He was discharged home on POD 2.   Post operative ABI's as follows:  RIGHT    LEFT    PRESSURE  WAVEFORM  PRESSURE WAVEFORM  BRACHIAL 120  BRACHIAL 131   DP 70  DP 82   PT 109  PT 79     RIGHT LEFT  ABI 0.83 0.63   The remainder of the hospital course consisted of increasing mobilization and increasing intake of solids without difficulty.  CBC    Component Value Date/Time   WBC 10.9* 05/14/2015 0246   RBC 4.71 05/14/2015 0246   HGB 13.6 05/14/2015 0246   HCT 39.9 05/14/2015 0246   PLT 116* 05/14/2015 0246   MCV 84.7 05/14/2015 0246   MCH 28.9 05/14/2015 0246   MCHC 34.1 05/14/2015 0246   RDW 12.5 05/14/2015 0246   LYMPHSABS 2.6 04/29/2015 1427   MONOABS 0.7 04/29/2015 1427   EOSABS 0.3 04/29/2015 1427   BASOSABS 0.0 04/29/2015 1427    BMET    Component Value Date/Time   NA 135 05/14/2015 0246   K 4.4 05/14/2015 0246   CL 103 05/14/2015 0246   CO2 23 05/14/2015 0246   GLUCOSE 235* 05/14/2015 0246   BUN 13 05/14/2015 0246   CREATININE 0.91 05/14/2015 0246   CREATININE 0.96 04/29/2015 1427   CALCIUM 8.5* 05/14/2015 0246   GFRNONAA >60 05/14/2015 0246   GFRAA >60 05/14/2015 0246       Discharge Instructions    Call MD for:  redness, tenderness, or signs of infection (pain, swelling, bleeding, redness, odor or green/yellow discharge around incision site)    Complete by:  As directed      Call MD for:  severe or increased pain, loss or decreased feeling  in affected limb(s)    Complete  by:  As directed      Call MD for:  temperature >100.5    Complete by:  As directed      Discharge wound care:    Complete by:  As directed   Wash the groin wound with soap and water daily and pat dry. (No tub bath-only shower)  Then put a dry gauze or washcloth there to keep this area dry daily and as needed.  Do not use Vaseline or neosporin on your incisions.  Only use soap and water on your incisions and then protect and keep dry.     Driving Restrictions    Complete by:  As directed   No driving for 2 weeks     Lifting restrictions     Complete by:  As directed   No lifting for 4 weeks     Resume previous diet    Complete by:  As directed            Discharge Diagnosis:  Critical Limb Ischemia right leg I99.9 right cold leg  Secondary Diagnosis: Patient Active Problem List   Diagnosis Date Noted  . PAD (peripheral artery disease) 05/12/2015  . Type II diabetes mellitus with complication, uncontrolled 05/08/2015  . Abnormal nuclear stress test 05/06/2015  . CAD (coronary artery disease) of artery bypass graft 04/29/2015  . Hyperlipidemia 04/29/2015  . Essential hypertension 04/29/2015  . Tobacco abuse 04/29/2015  . Critical lower limb ischemia 03/31/2015  . Atherosclerosis of native arteries of extremity with rest pain 03/30/2015   Past Medical History  Diagnosis Date  . CAD (coronary artery disease) 2011    Multivessel s/p CABG in [place]  . Essential hypertension   . Type 2 diabetes mellitus   . Hyperlipidemia   . Peripheral arterial disease   . Abnormal nuclear stress test 05/06/2015       Medication List    TAKE these medications        aspirin EC 81 MG tablet  Take 81 mg by mouth daily.     atorvastatin 80 MG tablet  Commonly known as:  LIPITOR  Take 1 tablet (80 mg total) by mouth daily.     metFORMIN 1000 MG tablet  Commonly known as:  GLUCOPHAGE  Take 1,000 mg by mouth 2 (two) times daily with a meal.     metoprolol tartrate 25 MG tablet  Commonly known as:  LOPRESSOR  Take 0.5 tablets (12.5 mg total) by mouth daily.     oxyCODONE 5 MG immediate release tablet  Commonly known as:  ROXICODONE  Take 1 tablet (5 mg total) by mouth every 6 (six) hours as needed.     oxyCODONE-acetaminophen 5-325 MG per tablet  Commonly known as:  PERCOCET/ROXICET  Take 1 tablet by mouth every 4 (four) hours as needed for severe pain.        Prescriptions given: 1.  Roxicodone #30 No Refill  Instructions: 1.  Wash the groin wound with soap and water daily and pat dry. (No tub bath-only  shower)  Then put a dry gauze or washcloth there to keep this area dry daily and as needed.  Do not use Vaseline or neosporin on your incisions.  Only use soap and water on your incisions and then protect and keep dry.   Disposition: home  Patient's condition: is Good  Follow up: 1. Dr. Hart Rochester in 2 weeks   Doreatha Massed, PA-C [facility] [phone] 05/15/2015  7:44 AM  - For VQI Registry  use --- Instructions: Press F2 to tab through selections.  Delete question if not applicable.   Post-op:  Wound infection: No  Graft infection: No  Transfusion: No  If yes, n/a units given New Arrhythmia: No Ipsilateral amputation: No, [ ]  Minor, [ ]  BKA, [ ]  AKA Discharge patency: [ ]  Primary, [x ] Primary assisted, [ ]  Secondary, [ ]  Occluded Patency judged by: [ ]  Dopper only, [ ]  Palpable graft pulse, [ ]  Palpable distal pulse, [ ]  ABI inc. > 0.15, [ ]  Duplex Discharge ABI: R 0.83, L 0.63 Discharge TBI: R , L  D/C Ambulatory Status: Ambulatory  Complications: MI: No, [ ]  Troponin only, [ ]  EKG or Clinical CHF: No Resp failure:No, [ ]  Pneumonia, [ ]  Ventilator Chg in renal function: No, [ ]  Inc. Cr > 0.5, [ ]  Temp. Dialysis, [ ]  Permanent dialysis Stroke: No, [ ]  Minor, [ ]  Major Return to OR: Yes  Reason for return to OR: [ ]  Bleeding, [ ]  Infection, [x]  Thrombosis, [ ]  Revision  Discharge medications: Statin use:  yes ASA use:  yes Plavix use:  no Beta blocker use: yes Coumadin use: no

## 2015-05-15 NOTE — Progress Notes (Signed)
  Progress Note    05/15/2015 7:25 AM 2 Days Post-Op  Subjective:  Ready to go home.  Appreciative of services  Tm 99.3 now afebrile HR 80's-100's NSR 120's-140's systolic 96% RA  Filed Vitals:   05/15/15 0409  BP: 143/60  Pulse: 88  Temp: 98.1 F (36.7 C)  Resp: 20    Physical Exam: Cardiac:  regular Lungs:  Non labored Incisions:  Bilateral groin incisions are healing nicely.  All other incisions healing nicely as well Extremities:  Right foot is warm with 2+ palpable PT and palpable graft pulse.   CBC    Component Value Date/Time   WBC 10.9* 05/14/2015 0246   RBC 4.71 05/14/2015 0246   HGB 13.6 05/14/2015 0246   HCT 39.9 05/14/2015 0246   PLT 116* 05/14/2015 0246   MCV 84.7 05/14/2015 0246   MCH 28.9 05/14/2015 0246   MCHC 34.1 05/14/2015 0246   RDW 12.5 05/14/2015 0246   LYMPHSABS 2.6 04/29/2015 1427   MONOABS 0.7 04/29/2015 1427   EOSABS 0.3 04/29/2015 1427   BASOSABS 0.0 04/29/2015 1427    BMET    Component Value Date/Time   NA 135 05/14/2015 0246   K 4.4 05/14/2015 0246   CL 103 05/14/2015 0246   CO2 23 05/14/2015 0246   GLUCOSE 235* 05/14/2015 0246   BUN 13 05/14/2015 0246   CREATININE 0.91 05/14/2015 0246   CREATININE 0.96 04/29/2015 1427   CALCIUM 8.5* 05/14/2015 0246   GFRNONAA >60 05/14/2015 0246   GFRAA >60 05/14/2015 0246    INR    Component Value Date/Time   INR 0.96 05/13/2015 0547     Intake/Output Summary (Last 24 hours) at 05/15/15 0725 Last data filed at 05/15/15 0409  Gross per 24 hour  Intake    862 ml  Output   2600 ml  Net  -1738 ml   ABI's 05/14/15:  RIGHT    LEFT    PRESSURE WAVEFORM  PRESSURE WAVEFORM  BRACHIAL 120  BRACHIAL 131   DP 70  DP 82   PT 109  PT 79     RIGHT LEFT  ABI 0.83 0.63         Assessment:  54 y.o. male is s/p:  BYPASS GRAFT RIGHT FEMORAL-POSTERIOR TIBIAL ARTERY USING LEFT NONREVERSED TRANSLOCATED GREATER SAPPHENOUS VEIN LEFT GREATER SAPPHENOUS  VEIN HARVEST RIGHT COMMON FEMORAL, SUPERFICIAL FEMORAL AND PROFUNDA ENDARTERECTOMY  RIGHT FEMORAL ARTERY PATCH ANGIOPLASTY RIGHT LOWER LEG INTRA OPERATIVE ARTERIOGRAM and  Thrombectomy of right femoral to posterior tibial bypass 2 Days Post-Op   Plan: -pt doing well with patent bypass graft -home today -f/u with Dr. Hart RochesterLawson in 2 weeks. -DVT prophylaxis:  Heparin SQ   Doreatha MassedSamantha Abel Ra, PA-C [facility] [phone] 05/15/2015 7:25 AM

## 2015-05-15 NOTE — Care Management Note (Signed)
Case Management Note  Patient Details  Name: Hector Green MRN: [id number] Date of Birth: November 14, 1961  Subjective/Objective:         Thrombectomy of right femoral to posterior tibial bypass           Action/Plan:   Expected Discharge Date:     05/15/2015             Expected Discharge Plan:  Home/Self Care  In-House Referral:     Discharge planning Services  CM Consult  Post Acute Care Choice:    Choice offered to:     DME Arranged:  [facility] DME Agency:  [facility]  [facility]     Status of Service:  Completed, signed off  Medicare Important Message Given:  Yes Date Medicare IM Given:  05/15/15 Medicare IM give by:  [name] RN CCM Date Additional Medicare IM Given:    Additional Medicare Important Message give by:     If discussed at Long Length of Stay Meetings, dates discussed:    Additional Comments: NCM spoke to pt and requested RW for home. Contacted [facility] for DME for scheduled dc home today.  Elliot CousinShavis, Elliet Goodnow Ellen, RN 05/15/2015, 10:45 AM

## 2015-05-18 ENCOUNTER — Telehealth: Payer: Self-pay | Admitting: Vascular Surgery

## 2015-05-18 NOTE — Telephone Encounter (Signed)
-----   Message from Sharee PimpleMarilyn K McChesney, RN sent at 05/15/2015  9:38 AM EDT ----- Regarding: Schedule   ----- Message -----    From: Dara LordsSamantha J Rhyne, PA-C    Sent: 05/15/2015   7:43 AM      To: Vvs Charge Pool  S/p right fem pop with left leg saphenous vein harvest and thrombectomy of bypass.  F/u with Dr. Hart RochesterLawson in 2 weeks.   Thanks, Lelon MastSamantha

## 2015-05-18 NOTE — Telephone Encounter (Signed)
Lm for pt re appt, dpm  °

## 2015-05-19 ENCOUNTER — Telehealth: Payer: Self-pay

## 2015-05-19 NOTE — Telephone Encounter (Signed)
Phone call from pt.  Reported small blisters that have developed at location of steri-strips, bilateral incision, right LE.  Stated several steri-strips have come off, and there are approx. 5 left intact.  Stated there is "a little open area at the top of the incision, near the knee, that is draining a small amt. of reddish-yellow, thin fluid."  Stated the remainder of the incision is intact.  Denied fever/ chills.  Reported he took a shower today, and noticed the blisters at that time.  Advised to moisten the steri-strips with warm water, and gently remove them, to reduce further irritation.  Encouraged to cover incisional area with clean, dry gauze, and to avoid having tape/ adhesive come in contact with the skin.  Encourage to use a gauze roll to secure the gauze dressing over the incision.  Advised to call office if symptoms worsen. Verb. Understanding.

## 2015-05-21 ENCOUNTER — Telehealth: Payer: Self-pay

## 2015-05-21 DIAGNOSIS — G8918 Other acute postprocedural pain: Secondary | ICD-10-CM

## 2015-05-21 MED ORDER — OXYCODONE HCL 5 MG PO TABS: ORAL_TABLET | ORAL | Status: DC

## 2015-05-21 NOTE — Telephone Encounter (Signed)
Phone call from pt.  Reported he is taking Oxycodone IR 5 mg, 2 tabs q 4 hrs/ prn / pain, instead of the instructions to take 1 tablet q 6 hrs prn.  Reported he is walking regularly.  Stated he has most of the discomfort when he is in bed and when he first awakens in the morning.   Reported the pain is about 7/10 at this time, because he just got back from a walk.   Denied any peri-incisional redness or warmth, or fever/ chills.  Reported that the blisters on one side of the incision have drained, and there is one blister remaining.  Reported here is a small amt. Of yellow-orange drainage from the lower leg incision.  Encouraged to continue to monitor for signs of infection, and report any worsening symptoms.  Discussed with Dr. Darrick Penna.  Gave v.o. for refill of Oxycodone IR, 5 mg 1-2 tablets  q 6 hrs/ prn # 30/ no refills.  Pt. Advised to pick up printed Rx at the office.  Verb. Understanding.

## 2015-05-26 ENCOUNTER — Other Ambulatory Visit: Payer: Self-pay

## 2015-05-26 ENCOUNTER — Encounter (HOSPITAL_COMMUNITY): Payer: Self-pay | Admitting: *Deleted

## 2015-05-26 ENCOUNTER — Telehealth: Payer: Self-pay

## 2015-05-26 NOTE — Telephone Encounter (Signed)
Called [facility] ED; spoke with Duwayne Heck, RN regarding plan for pt. To arrive to the ER at 6:30 AM, and to have Dr. Hart Rochester paged, and to initiate the Pre-op lab work, already ordered in [employer].  Per Duwayne Heck, she will give this information to the night nurse when she reports off at 11:00 PM.  Also, per Duwayne Heck, she is not sure if the outpatient orders will populate/ cross over in the chart.  Will notify Karsten Ro, PA.

## 2015-05-26 NOTE — Telephone Encounter (Signed)
Phone call from pt.  Reported worsening pain in the right foot.  Over past 4 days.  Stated he has to walk on his heel, due to the pain between the toes to the arch of right foot.  Stated the pain is worse when having his foot down, and in standing, and feels better with elevation.  Reported the right leg feels heavy.  Reported that there is a stinging and throbbing in the toes and ball of (R) foot with any movement.  Stated "I can hardly walk."  Reported a "light red color" of the skin of right foot up to calf, compared to normal color of left foot/ lower leg.  Rated the pain at 8.5/10.  Stated the pain medication eases it, but doesn't take it away.  Pt. stated he could not come to the office today.  Discussed with Dr. Hart Rochester.  Recommended to have pt. report to the [facility] ED tomorrow, at 6:30 AM, and to have the staff page him to come to see the pt.  Also gave v.o. to schedule pt. for Thrombectomy of Right Femoral- Posterior Tibial BPG at 8:30 AM.  Pt. notified of instructions to arrive at [facility]. ED tomorrow @ 6:30 AM, NPO after midnight, except to take morning dose of ASA with sips of water, to hold AM doses of diabetic medication, and is scheduled for surgery at 8:30 AM.  Pt. verb. understanding; agrees with plan.

## 2015-05-27 ENCOUNTER — Encounter (HOSPITAL_COMMUNITY)
Admission: RE | Disposition: A | Discharge status: Home / Self Care | Payer: Self-pay | Source: Ambulatory Visit | Attending: Vascular Surgery

## 2015-05-27 ENCOUNTER — Inpatient Hospital Stay (HOSPITAL_COMMUNITY): Payer: Medicare Other | Admitting: Anesthesiology

## 2015-05-27 ENCOUNTER — Encounter (HOSPITAL_COMMUNITY): Payer: Self-pay | Admitting: *Deleted

## 2015-05-27 ENCOUNTER — Inpatient Hospital Stay (HOSPITAL_COMMUNITY)
Admission: RE | Admit: 2015-05-27 | Discharge: 2015-05-29 | DRG: 253 | Disposition: A | Discharge status: Home / Self Care | Payer: Medicare Other | Source: Ambulatory Visit | Attending: Vascular Surgery | Admitting: Vascular Surgery

## 2015-05-27 DIAGNOSIS — Z419 Encounter for procedure for purposes other than remedying health state, unspecified: Secondary | ICD-10-CM

## 2015-05-27 DIAGNOSIS — T827XXA Infection and inflammatory reaction due to other cardiac and vascular devices, implants and grafts, initial encounter: Secondary | ICD-10-CM | POA: Diagnosis present

## 2015-05-27 DIAGNOSIS — I739 Peripheral vascular disease, unspecified: Secondary | ICD-10-CM | POA: Diagnosis present

## 2015-05-27 DIAGNOSIS — I2510 Atherosclerotic heart disease of native coronary artery without angina pectoris: Secondary | ICD-10-CM | POA: Diagnosis present

## 2015-05-27 DIAGNOSIS — E119 Type 2 diabetes mellitus without complications: Secondary | ICD-10-CM | POA: Diagnosis present

## 2015-05-27 DIAGNOSIS — Y832 Surgical operation with anastomosis, bypass or graft as the cause of abnormal reaction of the patient, or of later complication, without mention of misadventure at the time of the procedure: Secondary | ICD-10-CM | POA: Diagnosis present

## 2015-05-27 DIAGNOSIS — T82898A Other specified complication of vascular prosthetic devices, implants and grafts, initial encounter: Secondary | ICD-10-CM | POA: Diagnosis present

## 2015-05-27 DIAGNOSIS — I10 Essential (primary) hypertension: Secondary | ICD-10-CM | POA: Diagnosis not present

## 2015-05-27 DIAGNOSIS — E440 Moderate protein-calorie malnutrition: Secondary | ICD-10-CM | POA: Diagnosis not present

## 2015-05-27 DIAGNOSIS — T82868A Thrombosis of vascular prosthetic devices, implants and grafts, initial encounter: Secondary | ICD-10-CM | POA: Diagnosis not present

## 2015-05-27 DIAGNOSIS — Z7982 Long term (current) use of aspirin: Secondary | ICD-10-CM

## 2015-05-27 DIAGNOSIS — Z880 Allergy status to penicillin: Secondary | ICD-10-CM

## 2015-05-27 DIAGNOSIS — F17210 Nicotine dependence, cigarettes, uncomplicated: Secondary | ICD-10-CM | POA: Diagnosis present

## 2015-05-27 DIAGNOSIS — G8918 Other acute postprocedural pain: Secondary | ICD-10-CM

## 2015-05-27 DIAGNOSIS — Z9889 Other specified postprocedural states: Secondary | ICD-10-CM | POA: Diagnosis not present

## 2015-05-27 DIAGNOSIS — E785 Hyperlipidemia, unspecified: Secondary | ICD-10-CM | POA: Diagnosis present

## 2015-05-27 DIAGNOSIS — Z951 Presence of aortocoronary bypass graft: Secondary | ICD-10-CM

## 2015-05-27 DIAGNOSIS — M79671 Pain in right foot: Secondary | ICD-10-CM | POA: Diagnosis not present

## 2015-05-27 DIAGNOSIS — I252 Old myocardial infarction: Secondary | ICD-10-CM

## 2015-05-27 DIAGNOSIS — T82392A Other mechanical complication of femoral arterial graft (bypass), initial encounter: Secondary | ICD-10-CM | POA: Diagnosis not present

## 2015-05-27 HISTORY — PX: FEMORAL-TIBIAL BYPASS GRAFT: SHX938

## 2015-05-27 HISTORY — PX: INTRAOPERATIVE ARTERIOGRAM: SHX5157

## 2015-05-27 LAB — COMPREHENSIVE METABOLIC PANEL
ALK PHOS: 73 U/L (ref 38–126)
ALT: 12 U/L — ABNORMAL LOW (ref 17–63)
AST: 10 U/L — AB (ref 15–41)
Albumin: 3.3 g/dL — ABNORMAL LOW (ref 3.5–5.0)
Anion gap: 10 (ref 5–15)
BILIRUBIN TOTAL: 0.5 mg/dL (ref 0.3–1.2)
BUN: 17 mg/dL (ref 6–20)
CHLORIDE: 100 mmol/L — AB (ref 101–111)
CO2: 23 mmol/L (ref 22–32)
Calcium: 9.4 mg/dL (ref 8.9–10.3)
Creatinine, Ser: 0.9 mg/dL (ref 0.61–1.24)
GFR calc Af Amer: >60 mL/min (ref >60)
GFR calc non Af Amer: >60 mL/min (ref >60)
Glucose, Bld: 352 mg/dL — ABNORMAL HIGH (ref 65–99)
POTASSIUM: 4.6 mmol/L (ref 3.5–5.1)
SODIUM: 133 mmol/L — AB (ref 135–145)
Total Protein: 6.3 g/dL — ABNORMAL LOW (ref 6.5–8.1)

## 2015-05-27 LAB — GLUCOSE, CAPILLARY
GLUCOSE-CAPILLARY: 244 mg/dL — AB (ref 65–99)
GLUCOSE-CAPILLARY: 200 mg/dL — AB (ref 65–99)
GLUCOSE-CAPILLARY: 185 mg/dL — AB (ref 65–99)
Glucose-Capillary: 346 mg/dL — ABNORMAL HIGH (ref 65–99)
Glucose-Capillary: 253 mg/dL — ABNORMAL HIGH (ref 65–99)
Glucose-Capillary: 232 mg/dL — ABNORMAL HIGH (ref 65–99)
Glucose-Capillary: 171 mg/dL — ABNORMAL HIGH (ref 65–99)

## 2015-05-27 LAB — CBC
HCT: 34.2 % — ABNORMAL LOW (ref 39.0–52.0)
HEMATOCRIT: 40.1 % (ref 39.0–52.0)
HEMOGLOBIN: 13.9 g/dL (ref 13.0–17.0)
Hemoglobin: 11.8 g/dL — ABNORMAL LOW (ref 13.0–17.0)
MCH: 29 pg (ref 26.0–34.0)
MCH: 28.8 pg (ref 26.0–34.0)
MCHC: 34.7 g/dL (ref 30.0–36.0)
MCHC: 34.5 g/dL (ref 30.0–36.0)
MCV: 83.5 fL (ref 78.0–100.0)
MCV: 83.4 fL (ref 78.0–100.0)
PLATELETS: 170 10*3/uL (ref 150–400)
Platelets: 193 10*3/uL (ref 150–400)
RBC: 4.8 MIL/uL (ref 4.22–5.81)
RBC: 4.1 MIL/uL — AB (ref 4.22–5.81)
RDW: 12.7 % (ref 11.5–15.5)
RDW: 12.8 % (ref 11.5–15.5)
WBC: 11.2 10*3/uL — AB (ref 4.0–10.5)
WBC: 13.7 10*3/uL — AB (ref 4.0–10.5)

## 2015-05-27 LAB — TYPE AND SCREEN
ABO/RH(D): A POS
Antibody Screen: NEGATIVE

## 2015-05-27 LAB — SURGICAL PCR SCREEN
MRSA, PCR: NEGATIVE
Staphylococcus aureus: NEGATIVE

## 2015-05-27 LAB — HEPARIN LEVEL (UNFRACTIONATED): Heparin Unfractionated: <0.1 IU/mL — ABNORMAL LOW (ref 0.30–0.70)

## 2015-05-27 LAB — APTT: aPTT: 26 seconds (ref 24–37)

## 2015-05-27 LAB — PROTIME-INR
INR: 0.98 (ref 0.00–1.49)
PROTHROMBIN TIME: 13.2 s (ref 11.6–15.2)

## 2015-05-27 SURGERY — CREATION, BYPASS, ARTERIAL, FEMORAL TO TIBIAL, USING GRAFT
Anesthesia: General | Site: Leg Lower | Laterality: Right

## 2015-05-27 MED ORDER — NEOSTIGMINE METHYLSULFATE 10 MG/10ML IV SOLN
INTRAVENOUS | Status: DC | PRN
  Administered 2015-05-27: 3 mg via INTRAVENOUS

## 2015-05-27 MED ORDER — PHENYLEPHRINE HCL 10 MG/ML IJ SOLN
INTRAMUSCULAR | Status: DC | PRN
  Administered 2015-05-27: 120 ug via INTRAVENOUS
  Administered 2015-05-27: 80 ug via INTRAVENOUS

## 2015-05-27 MED ORDER — INSULIN REGULAR HUMAN 100 UNIT/ML IJ SOLN
6.0000 [IU] | Freq: Once | INTRAMUSCULAR | Status: DC
  Filled 2015-05-27: qty 0.06

## 2015-05-27 MED ORDER — DOCUSATE SODIUM 100 MG PO CAPS
100.0000 mg | ORAL_CAPSULE | Freq: Every day | ORAL | Status: DC
  Administered 2015-05-28 – 2015-05-29 (×2): 100 mg via ORAL
  Filled 2015-05-27 (×2): qty 1

## 2015-05-27 MED ORDER — FENTANYL CITRATE (PF) 250 MCG/5ML IJ SOLN
INTRAMUSCULAR | Status: AC
  Filled 2015-05-27: qty 5

## 2015-05-27 MED ORDER — SODIUM CHLORIDE 0.9 % IR SOLN
Status: DC | PRN
  Administered 2015-05-27: 500 mL

## 2015-05-27 MED ORDER — IODIXANOL 320 MG/ML IV SOLN
INTRAVENOUS | Status: DC | PRN
  Administered 2015-05-27: 50 mL via INTRA_ARTERIAL

## 2015-05-27 MED ORDER — BOOST / RESOURCE BREEZE PO LIQD
1.0000 | Freq: Three times a day (TID) | ORAL | Status: DC
  Administered 2015-05-27 – 2015-05-29 (×3): 1 via ORAL

## 2015-05-27 MED ORDER — PROPOFOL 10 MG/ML IV BOLUS
INTRAVENOUS | Status: AC
  Filled 2015-05-27: qty 20

## 2015-05-27 MED ORDER — HYDROMORPHONE HCL 1 MG/ML IJ SOLN: 0.2500 mg | INTRAMUSCULAR | Status: DC | PRN

## 2015-05-27 MED ORDER — CIPROFLOXACIN IN D5W 400 MG/200ML IV SOLN
400.0000 mg | Freq: Once | INTRAVENOUS | Status: DC
  Filled 2015-05-27: qty 200

## 2015-05-27 MED ORDER — NEOSTIGMINE METHYLSULFATE 10 MG/10ML IV SOLN
INTRAVENOUS | Status: AC
  Filled 2015-05-27: qty 1

## 2015-05-27 MED ORDER — HEPARIN (PORCINE) IN NACL 100-0.45 UNIT/ML-% IJ SOLN: 1000.0000 [IU]/h | INTRAMUSCULAR | Status: DC

## 2015-05-27 MED ORDER — SENNOSIDES-DOCUSATE SODIUM 8.6-50 MG PO TABS
1.0000 | ORAL_TABLET | Freq: Every evening | ORAL | Status: DC | PRN
  Filled 2015-05-27: qty 1

## 2015-05-27 MED ORDER — PROMETHAZINE HCL 25 MG/ML IJ SOLN: 6.2500 mg | INTRAMUSCULAR | Status: DC | PRN

## 2015-05-27 MED ORDER — ACETAMINOPHEN 650 MG RE SUPP: 325.0000 mg | RECTAL | Status: DC | PRN

## 2015-05-27 MED ORDER — ASPIRIN EC 81 MG PO TBEC
81.0000 mg | DELAYED_RELEASE_TABLET | Freq: Every day | ORAL | Status: DC
  Administered 2015-05-28 – 2015-05-29 (×2): 81 mg via ORAL
  Filled 2015-05-27 (×2): qty 1

## 2015-05-27 MED ORDER — ROCURONIUM BROMIDE 50 MG/5ML IV SOLN
INTRAVENOUS | Status: AC
  Filled 2015-05-27: qty 1

## 2015-05-27 MED ORDER — LIDOCAINE HCL (CARDIAC) 20 MG/ML IV SOLN
INTRAVENOUS | Status: AC
  Filled 2015-05-27: qty 5

## 2015-05-27 MED ORDER — OXYCODONE HCL 5 MG/5ML PO SOLN
5.0000 mg | Freq: Once | ORAL | Status: DC | PRN
Start: 2015-05-27 — End: 2015-05-27

## 2015-05-27 MED ORDER — WARFARIN SODIUM 10 MG PO TABS
10.0000 mg | ORAL_TABLET | Freq: Once | ORAL | Status: AC
  Administered 2015-05-27: 10 mg via ORAL
  Filled 2015-05-27: qty 1

## 2015-05-27 MED ORDER — PHENOL 1.4 % MT LIQD: 1.0000 | OROMUCOSAL | Status: DC | PRN

## 2015-05-27 MED ORDER — FENTANYL CITRATE (PF) 250 MCG/5ML IJ SOLN
INTRAMUSCULAR | Status: DC | PRN
  Administered 2015-05-27: 50 ug via INTRAVENOUS
  Administered 2015-05-27: 150 ug via INTRAVENOUS
  Administered 2015-05-27: 100 ug via INTRAVENOUS
  Administered 2015-05-27: 250 ug via INTRAVENOUS
  Administered 2015-05-27: 50 ug via INTRAVENOUS
  Administered 2015-05-27: 150 ug via INTRAVENOUS

## 2015-05-27 MED ORDER — MIDAZOLAM HCL 5 MG/5ML IJ SOLN
INTRAMUSCULAR | Status: DC | PRN
  Administered 2015-05-27: 2 mg via INTRAVENOUS

## 2015-05-27 MED ORDER — INSULIN REGULAR HUMAN 100 UNIT/ML IJ SOLN
INTRAMUSCULAR | Status: DC | PRN
  Administered 2015-05-27: 10 [IU] via INTRAVENOUS
  Administered 2015-05-27: 6 [IU] via SUBCUTANEOUS

## 2015-05-27 MED ORDER — HEPARIN BOLUS VIA INFUSION
2000.0000 [IU] | Freq: Once | INTRAVENOUS | Status: AC
  Administered 2015-05-27: 2000 [IU] via INTRAVENOUS

## 2015-05-27 MED ORDER — ALUM & MAG HYDROXIDE-SIMETH 200-200-20 MG/5ML PO SUSP: 15.0000 mL | ORAL | Status: DC | PRN

## 2015-05-27 MED ORDER — NEOSTIGMINE METHYLSULFATE 10 MG/10ML IV SOLN
INTRAVENOUS | Status: AC
  Filled 2015-05-27: qty 2

## 2015-05-27 MED ORDER — PROPOFOL 10 MG/ML IV BOLUS
INTRAVENOUS | Status: DC | PRN
  Administered 2015-05-27: 150 mg via INTRAVENOUS

## 2015-05-27 MED ORDER — OXYCODONE HCL 5 MG PO TABS
5.0000 mg | ORAL_TABLET | ORAL | Status: DC | PRN
  Administered 2015-05-27: 5 mg via ORAL
  Administered 2015-05-27 – 2015-05-29 (×3): 10 mg via ORAL
  Filled 2015-05-27: qty 2
  Filled 2015-05-27: qty 1
  Filled 2015-05-27 (×2): qty 2

## 2015-05-27 MED ORDER — ROCURONIUM BROMIDE 50 MG/5ML IV SOLN
INTRAVENOUS | Status: AC
  Filled 2015-05-27: qty 2

## 2015-05-27 MED ORDER — ALBUTEROL SULFATE HFA 108 (90 BASE) MCG/ACT IN AERS
INHALATION_SPRAY | RESPIRATORY_TRACT | Status: DC | PRN
  Administered 2015-05-27: 3 via RESPIRATORY_TRACT

## 2015-05-27 MED ORDER — SODIUM CHLORIDE 0.9 % IJ SOLN
INTRAMUSCULAR | Status: AC
  Filled 2015-05-27: qty 10

## 2015-05-27 MED ORDER — SODIUM CHLORIDE 0.9 % IV SOLN: 500.0000 mL | Freq: Once | INTRAVENOUS | Status: AC | PRN

## 2015-05-27 MED ORDER — LIDOCAINE HCL (CARDIAC) 20 MG/ML IV SOLN
INTRAVENOUS | Status: DC | PRN
  Administered 2015-05-27: 100 mg via INTRAVENOUS

## 2015-05-27 MED ORDER — METOPROLOL TARTRATE 12.5 MG HALF TABLET
12.5000 mg | ORAL_TABLET | Freq: Every day | ORAL | Status: DC
  Administered 2015-05-27 – 2015-05-29 (×3): 12.5 mg via ORAL
  Filled 2015-05-27 (×3): qty 1

## 2015-05-27 MED ORDER — MORPHINE SULFATE 2 MG/ML IJ SOLN
2.0000 mg | INTRAMUSCULAR | Status: DC | PRN
  Administered 2015-05-27 – 2015-05-28 (×3): 2 mg via INTRAVENOUS
  Filled 2015-05-27 (×3): qty 1

## 2015-05-27 MED ORDER — GLYCOPYRROLATE 0.2 MG/ML IJ SOLN
INTRAMUSCULAR | Status: AC
  Filled 2015-05-27: qty 2

## 2015-05-27 MED ORDER — POTASSIUM CHLORIDE CRYS ER 20 MEQ PO TBCR: 20.0000 meq | EXTENDED_RELEASE_TABLET | Freq: Every day | ORAL | Status: DC | PRN

## 2015-05-27 MED ORDER — BISACODYL 10 MG RE SUPP: 10.0000 mg | Freq: Every day | RECTAL | Status: DC | PRN

## 2015-05-27 MED ORDER — ONDANSETRON HCL 4 MG/2ML IJ SOLN: 4.0000 mg | Freq: Four times a day (QID) | INTRAMUSCULAR | Status: DC | PRN

## 2015-05-27 MED ORDER — CIPROFLOXACIN IN D5W 400 MG/200ML IV SOLN
400.0000 mg | Freq: Once | INTRAVENOUS | Status: DC
Start: 2015-05-27 — End: 2015-05-27
  Administered 2015-05-27: 400 mg via INTRAVENOUS

## 2015-05-27 MED ORDER — HEPARIN SODIUM (PORCINE) 1000 UNIT/ML IJ SOLN
INTRAMUSCULAR | Status: DC | PRN
  Administered 2015-05-27: 6000 [IU] via INTRAVENOUS
  Administered 2015-05-27: 2000 [IU] via INTRAVENOUS

## 2015-05-27 MED ORDER — HEPARIN SODIUM (PORCINE) 1000 UNIT/ML IJ SOLN
INTRAMUSCULAR | Status: AC
  Filled 2015-05-27: qty 1

## 2015-05-27 MED ORDER — HEPARIN (PORCINE) IN NACL 100-0.45 UNIT/ML-% IJ SOLN
1600.0000 [IU]/h | INTRAMUSCULAR | Status: DC
  Administered 2015-05-27: 1000 [IU]/h via INTRAVENOUS
  Administered 2015-05-27: 800 [IU]/h via INTRAVENOUS
  Administered 2015-05-28: 1200 [IU]/h via INTRAVENOUS
  Filled 2015-05-27 (×6): qty 250

## 2015-05-27 MED ORDER — LABETALOL HCL 5 MG/ML IV SOLN
10.0000 mg | INTRAVENOUS | Status: DC | PRN
  Filled 2015-05-27: qty 4

## 2015-05-27 MED ORDER — WARFARIN - PHYSICIAN DOSING INPATIENT: Freq: Every day | Status: DC

## 2015-05-27 MED ORDER — INSULIN REGULAR HUMAN 100 UNIT/ML IJ SOLN
10.0000 [IU] | Freq: Once | INTRAMUSCULAR | Status: DC
  Filled 2015-05-27: qty 0.1

## 2015-05-27 MED ORDER — INSULIN ASPART 100 UNIT/ML ~~LOC~~ SOLN
0.0000 [IU] | Freq: Three times a day (TID) | SUBCUTANEOUS | Status: DC
  Administered 2015-05-27 – 2015-05-28 (×4): 5 [IU] via SUBCUTANEOUS
  Administered 2015-05-29: 8 [IU] via SUBCUTANEOUS

## 2015-05-27 MED ORDER — EPHEDRINE SULFATE 50 MG/ML IJ SOLN
INTRAMUSCULAR | Status: AC
  Filled 2015-05-27: qty 1

## 2015-05-27 MED ORDER — SODIUM CHLORIDE 0.9 % IV SOLN
INTRAVENOUS | Status: DC
  Administered 2015-05-27: 10:00:00 via INTRAVENOUS

## 2015-05-27 MED ORDER — SODIUM CHLORIDE 0.9 % IV SOLN
INTRAVENOUS | Status: DC
  Administered 2015-05-27: 22:00:00 via INTRAVENOUS
  Administered 2015-05-27: 100 mL/h via INTRAVENOUS
  Administered 2015-05-28: 11:00:00 via INTRAVENOUS

## 2015-05-27 MED ORDER — ROCURONIUM BROMIDE 100 MG/10ML IV SOLN
INTRAVENOUS | Status: DC | PRN
  Administered 2015-05-27: 20 mg via INTRAVENOUS
  Administered 2015-05-27: 50 mg via INTRAVENOUS

## 2015-05-27 MED ORDER — ONDANSETRON HCL 4 MG/2ML IJ SOLN
INTRAMUSCULAR | Status: DC | PRN
  Administered 2015-05-27: 4 mg via INTRAVENOUS

## 2015-05-27 MED ORDER — PROTAMINE SULFATE 10 MG/ML IV SOLN
INTRAVENOUS | Status: AC
  Filled 2015-05-27: qty 5

## 2015-05-27 MED ORDER — HYDRALAZINE HCL 20 MG/ML IJ SOLN: 5.0000 mg | INTRAMUSCULAR | Status: DC | PRN

## 2015-05-27 MED ORDER — PANTOPRAZOLE SODIUM 40 MG PO TBEC
40.0000 mg | DELAYED_RELEASE_TABLET | Freq: Every day | ORAL | Status: DC
  Administered 2015-05-27 – 2015-05-29 (×3): 40 mg via ORAL
  Filled 2015-05-27 (×3): qty 1

## 2015-05-27 MED ORDER — 0.9 % SODIUM CHLORIDE (POUR BTL) OPTIME
TOPICAL | Status: DC | PRN
Start: 2015-05-27 — End: 2015-05-27
  Administered 2015-05-27: 2000 mL

## 2015-05-27 MED ORDER — ACETAMINOPHEN 325 MG PO TABS: 325.0000 mg | ORAL_TABLET | ORAL | Status: DC | PRN

## 2015-05-27 MED ORDER — GUAIFENESIN-DM 100-10 MG/5ML PO SYRP: 15.0000 mL | ORAL_SOLUTION | ORAL | Status: DC | PRN

## 2015-05-27 MED ORDER — VANCOMYCIN HCL IN DEXTROSE 1-5 GM/200ML-% IV SOLN
1000.0000 mg | Freq: Three times a day (TID) | INTRAVENOUS | Status: DC
  Administered 2015-05-27 – 2015-05-29 (×6): 1000 mg via INTRAVENOUS
  Filled 2015-05-27 (×10): qty 200

## 2015-05-27 MED ORDER — MIDAZOLAM HCL 2 MG/2ML IJ SOLN
INTRAMUSCULAR | Status: AC
Start: 2015-05-27 — End: 2015-05-27
  Filled 2015-05-27: qty 2

## 2015-05-27 MED ORDER — MUPIROCIN 2 % EX OINT
1.0000 "application " | TOPICAL_OINTMENT | Freq: Once | CUTANEOUS | Status: AC
  Administered 2015-05-27: 1 via TOPICAL
  Filled 2015-05-27: qty 22

## 2015-05-27 MED ORDER — VANCOMYCIN HCL IN DEXTROSE 1-5 GM/200ML-% IV SOLN
1000.0000 mg | INTRAVENOUS | Status: AC
  Administered 2015-05-27: 1000 mg via INTRAVENOUS
  Filled 2015-05-27: qty 200

## 2015-05-27 MED ORDER — CHLORHEXIDINE GLUCONATE CLOTH 2 % EX PADS: 6.0000 | MEDICATED_PAD | Freq: Once | CUTANEOUS | Status: DC

## 2015-05-27 MED ORDER — ATORVASTATIN CALCIUM 80 MG PO TABS
80.0000 mg | ORAL_TABLET | Freq: Every day | ORAL | Status: DC
  Administered 2015-05-27 – 2015-05-28 (×2): 80 mg via ORAL
  Filled 2015-05-27 (×3): qty 1

## 2015-05-27 MED ORDER — GLYCOPYRROLATE 0.2 MG/ML IJ SOLN
INTRAMUSCULAR | Status: DC | PRN
  Administered 2015-05-27: .4 mg via INTRAVENOUS

## 2015-05-27 MED ORDER — OXYCODONE HCL 5 MG PO TABS: 5.0000 mg | ORAL_TABLET | Freq: Once | ORAL | Status: DC | PRN

## 2015-05-27 MED ORDER — METOPROLOL TARTRATE 1 MG/ML IV SOLN: 2.0000 mg | INTRAVENOUS | Status: DC | PRN

## 2015-05-27 MED ORDER — LACTATED RINGERS IV SOLN
INTRAVENOUS | Status: DC | PRN
  Administered 2015-05-27: 08:00:00 via INTRAVENOUS

## 2015-05-27 SURGICAL SUPPLY — 65 items
BAG BANDED W/RUBBER/TAPE 36X54 (MISCELLANEOUS) ×2 IMPLANT
BANDAGE ESMARK 6X9 LF (GAUZE/BANDAGES/DRESSINGS) IMPLANT
BNDG ESMARK 6X9 LF (GAUZE/BANDAGES/DRESSINGS)
CANISTER SUCTION 2500CC (MISCELLANEOUS) ×2 IMPLANT
CLIP TI MEDIUM 24 (CLIP) ×2 IMPLANT
CLIP TI WIDE RED SMALL 24 (CLIP) ×2 IMPLANT
COVER DOME SNAP 22 D (MISCELLANEOUS) ×2 IMPLANT
CUFF TOURNIQUET SINGLE 24IN (TOURNIQUET CUFF) IMPLANT
CUFF TOURNIQUET SINGLE 34IN LL (TOURNIQUET CUFF) IMPLANT
DRAIN SNY 10X20 3/4 PERF (WOUND CARE) IMPLANT
DRAPE IMP U-DRAPE 54X76 (DRAPES) ×2 IMPLANT
DRAPE INCISE IOBAN 66X45 STRL (DRAPES) ×2 IMPLANT
DRAPE ORTHO SPLIT 77X108 STRL (DRAPES) ×1
DRAPE PROXIMA HALF (DRAPES) ×2 IMPLANT
DRAPE SURG ORHT 6 SPLT 77X108 (DRAPES) ×1 IMPLANT
DRAPE X-RAY CASS 24X20 (DRAPES) IMPLANT
DRSG COVADERM 4X8 (GAUZE/BANDAGES/DRESSINGS) ×2 IMPLANT
ELECT REM PT RETURN 9FT ADLT (ELECTROSURGICAL) ×2
ELECTRODE REM PT RTRN 9FT ADLT (ELECTROSURGICAL) ×1 IMPLANT
EVACUATOR SILICONE 100CC (DRAIN) IMPLANT
GAUZE SPONGE 4X4 12PLY STRL (GAUZE/BANDAGES/DRESSINGS) ×2 IMPLANT
GLOVE BIO SURGEON STRL SZ 6.5 (GLOVE) ×6 IMPLANT
GLOVE BIOGEL PI IND STRL 6.5 (GLOVE) ×3 IMPLANT
GLOVE BIOGEL PI IND STRL 7.0 (GLOVE) ×1 IMPLANT
GLOVE BIOGEL PI INDICATOR 6.5 (GLOVE) ×3
GLOVE BIOGEL PI INDICATOR 7.0 (GLOVE) ×1
GLOVE SS BIOGEL STRL SZ 7 (GLOVE) ×1 IMPLANT
GLOVE SUPERSENSE BIOGEL SZ 7 (GLOVE) ×1
GLOVE SURG SS PI 7.0 STRL IVOR (GLOVE) ×2 IMPLANT
GOWN STRL REUS W/ TWL LRG LVL3 (GOWN DISPOSABLE) ×3 IMPLANT
GOWN STRL REUS W/TWL LRG LVL3 (GOWN DISPOSABLE) ×3
GOWN STRL REUS W/TWL XL LVL3 (GOWN DISPOSABLE) ×2 IMPLANT
INSERT FOGARTY SM (MISCELLANEOUS) ×2 IMPLANT
KIT BASIN OR (CUSTOM PROCEDURE TRAY) ×2 IMPLANT
KIT ROOM TURNOVER OR (KITS) ×2 IMPLANT
LIQUID BAND (GAUZE/BANDAGES/DRESSINGS) ×6 IMPLANT
NS IRRIG 1000ML POUR BTL (IV SOLUTION) ×4 IMPLANT
PACK PERIPHERAL VASCULAR (CUSTOM PROCEDURE TRAY) ×2 IMPLANT
PAD ARMBOARD 7.5X6 YLW CONV (MISCELLANEOUS) ×4 IMPLANT
PADDING CAST COTTON 6X4 STRL (CAST SUPPLIES) IMPLANT
SET COLLECT BLD 21X3/4 12 (NEEDLE) IMPLANT
SET COLLECT BLD 21X3/4 12 PB (MISCELLANEOUS) ×2 IMPLANT
SPONGE LAP 18X18 X RAY DECT (DISPOSABLE) ×2 IMPLANT
STOPCOCK 4 WAY LG BORE MALE ST (IV SETS) ×2 IMPLANT
SUT ETHILON 3 0 PS 1 (SUTURE) ×2 IMPLANT
SUT PROLENE 5 0 C 1 24 (SUTURE) ×2 IMPLANT
SUT PROLENE 5 0 C1 (SUTURE) IMPLANT
SUT PROLENE 6 0 BV (SUTURE) ×2 IMPLANT
SUT PROLENE 6 0 C 1 24 (SUTURE) ×4 IMPLANT
SUT PROLENE 6 0 CC (SUTURE) ×2 IMPLANT
SUT PROLENE 7 0 BV1 MDA (SUTURE) IMPLANT
SUT SILK 2 0 SH (SUTURE) ×2 IMPLANT
SUT SILK 3 0 (SUTURE)
SUT SILK 3-0 18XBRD TIE 12 (SUTURE) IMPLANT
SUT VIC AB 2-0 CT1 18 (SUTURE) ×2 IMPLANT
SUT VIC AB 2-0 CTX 36 (SUTURE) ×4 IMPLANT
SUT VIC AB 3-0 SH 27 (SUTURE) ×3
SUT VIC AB 3-0 SH 27X BRD (SUTURE) ×3 IMPLANT
SUT VICRYL 4-0 PS2 18IN ABS (SUTURE) ×2 IMPLANT
SYR 3ML LL SCALE MARK (SYRINGE) ×6 IMPLANT
SYR TB 1ML LUER SLIP (SYRINGE) ×2 IMPLANT
TRAY FOLEY W/METER SILVER 16FR (SET/KITS/TRAYS/PACK) ×2 IMPLANT
TUBING EXTENTION W/L.L. (IV SETS) ×2 IMPLANT
UNDERPAD 30X30 INCONTINENT (UNDERPADS AND DIAPERS) ×2 IMPLANT
WATER STERILE IRR 1000ML POUR (IV SOLUTION) ×2 IMPLANT

## 2015-05-27 NOTE — Consult Note (Signed)
PHARMACY CONSULT NOTE   Pharmacy Consult for :   Vancomycin Indication:  Possible infected groin wound  Hospital Problems: Active Problems:   Femoral-tibial bypass graft occlusion, right  Allergies  Allergen Reactions  . Fish-Derived Products Anaphylaxis  . Penicillins Anaphylaxis    Patient Measurements: Height: 5' 9.5" (176.5 cm) Weight: 198 lb 13.7 oz (90.201 kg) IBW/kg (Calculated) : 71.85  Dosing Weight:  90 kg    Vital Signs: Temp: 98.5 F (36.9 C) (06/15 1555) Temp Source: Oral (06/15 1555) BP: 160/85 mmHg (06/15 1555) Pulse Rate: 83 (06/15 1605)  Labs:  Recent Labs  05/27/15 0748  WBC 11.2*  HGB 13.9  PLT 193  CREATININE 0.90   Estimated Creatinine Clearance: 107.6 mL/min (by C-G formula based on Cr of 0.9).  Microbiology: Recent Results (from the past 720 hour(s))  MRSA PCR Screening     Status: None   Collection Time: 05/12/15  5:39 PM  Result Value Ref Range Status   MRSA by PCR NEGATIVE NEGATIVE Final    Comment:        The GeneXpert MRSA Assay (FDA approved for NASAL specimens only), is one component of a comprehensive MRSA colonization surveillance program. It is not intended to diagnose MRSA infection nor to guide or monitor treatment for MRSA infections.   Surgical pcr screen     Status: None   Collection Time: 05/27/15  8:13 AM  Result Value Ref Range Status   MRSA, PCR NEGATIVE NEGATIVE Final   Staphylococcus aureus NEGATIVE NEGATIVE Final    Comment:        The Xpert SA Assay (FDA approved for NASAL specimens in patients over 65 years of age), is one component of a comprehensive surveillance program.  Test performance has been validated by [facility] for patients greater than or equal to 58 year old. It is not intended to diagnose infection nor to guide or monitor treatment.     Medical/Surgical History: Past Medical History  Diagnosis Date  . CAD (coronary artery disease) 2011    Multivessel s/p CABG in  [place]  . Essential hypertension   . Type 2 diabetes mellitus   . Hyperlipidemia   . Peripheral arterial disease   . Abnormal nuclear stress test 05/06/2015  . Myocardial infarction 2011   Past Surgical History  Procedure Laterality Date  . Coronary artery bypass graft  2011    Three vessel by report  . Abdominal aortagram N/A 03/30/2015    Procedure: ABDOMINAL [name];  Surgeon: Fransisco Hertz, MD;  Location: [facility] CATH LAB;  Service: Cardiovascular;  Laterality: N/A;  . Lower extremity angiogram N/A 03/30/2015    Procedure: LOWER EXTREMITY ANGIOGRAM;  Surgeon: Fransisco Hertz, MD;  Location: [facility] CATH LAB;  Service: Cardiovascular;  Laterality: N/A;  . Cardiac catheterization N/A 05/06/2015    Procedure: Left Heart Cath and Cors/Grafts Angiography;  Surgeon: Tonny Bollman, MD;  Location: [facility] INVASIVE CV LAB;  Service: Cardiovascular;  Laterality: N/A;  . Femoral-tibial bypass graft Right 05/13/2015    Procedure: BYPASS GRAFT RIGHT FEMORAL-POSTERIOR TIBIAL ARTERY USING LEFT NONREVERSED TRANSLOCATED GREATER SAPPHENOUS VEIN;  Surgeon: Pryor Ochoa, MD;  Location: [facility] OR;  Service: Vascular;  Laterality: Right;  . Vein harvest Left 05/13/2015    Procedure: LEFT GREATER SAPPHENOUS VEIN HARVEST;  Surgeon: Pryor Ochoa, MD;  Location: [facility] OR;  Service: Vascular;  Laterality: Left;  . Endarterectomy femoral Right 05/13/2015    Procedure: RIGHT COMMON FEMORAL, SUPERFICIAL FEMORAL AND PROFUNDA ENDARTERECTOMY ;  Surgeon:  Pryor Ochoa, MD;  Location: [facility] OR;  Service: Vascular;  Laterality: Right;  . Patch angioplasty Right 05/13/2015    Procedure: RIGHT FEMORAL ARTERY PATCH ANGIOPLASTY;  Surgeon: Pryor Ochoa, MD;  Location: [facility] OR;  Service: Vascular;  Laterality: Right;  . Intraoperative arteriogram Right 05/13/2015    Procedure: RIGHT LOWER LEG INTRA OPERATIVE ARTERIOGRAM;  Surgeon: Pryor Ochoa, MD;  Location: [facility] OR;  Service: Vascular;  Laterality: Right;  . Thrombectomy femoral artery Right 05/13/2015     Procedure: THROMBECTOMY RIGHT FEMORAL-PROXIMAL POSTERIOR TIBAIL ARTERY BYPASS GRAFT;  Surgeon: Larina Earthly, MD;  Location: [facility] OR;  Service: Vascular;  Laterality: Right;  . Colonoscopy      Current Medication[s] Include: Prior to Admission: Prescriptions prior to admission  Medication Sig Dispense Refill Last Dose  . aspirin EC 81 MG tablet Take 81 mg by mouth daily.   05/20/2015  . atorvastatin (LIPITOR) 80 MG tablet Take 1 tablet (80 mg total) by mouth daily. 90 tablet 3 05/20/2015  . metFORMIN (GLUCOPHAGE) 1000 MG tablet Take 1,000 mg by mouth 2 (two) times daily with a meal.   0 05/20/2015  . metoprolol tartrate (LOPRESSOR) 25 MG tablet Take 0.5 tablets (12.5 mg total) by mouth daily. 45 tablet 3 05/20/2015 at 0800  . oxyCODONE (ROXICODONE) 5 MG immediate release tablet Take 1-2 tablets po. q 6 hrs/ prn 30 tablet 0 05/26/2015 at Unknown time   Scheduled:  Scheduled:  . [START ON 05/28/2015] aspirin EC  81 mg Oral Daily  . atorvastatin  80 mg Oral q1800  . [START ON 05/28/2015] docusate sodium  100 mg Oral Daily  . insulin aspart  0-15 Units Subcutaneous TID WC  . metoprolol tartrate  12.5 mg Oral Daily  . pantoprazole  40 mg Oral Daily  . warfarin  10 mg Oral ONCE-1800  . Warfarin - Physician Dosing Inpatient   Does not apply q1800   Infusion[s]: Infusions:  . sodium chloride 100 mL/hr (05/27/15 1606)  . heparin 1,000 Units/hr (05/27/15 1334)   Antibiotic[s]: Anti-infectives    Start     Dose/Rate Route Frequency Ordered Stop   05/27/15 0930  ciprofloxacin (CIPRO) IVPB 400 mg  Status:  Discontinued     400 mg 200 mL/hr over 60 Minutes Intravenous  Once 05/27/15 0920 05/27/15 0915   05/27/15 0915  ciprofloxacin (CIPRO) IVPB 400 mg  Status:  Discontinued     400 mg 200 mL/hr over 60 Minutes Intravenous  Once 05/27/15 0910 05/27/15 1600   05/27/15 0737  vancomycin (VANCOCIN) IVPB 1000 mg/200 mL premix     1,000 mg 200 mL/hr over 60 Minutes Intravenous 60 min pre-op 05/27/15 0737  05/27/15 0810      Assessment:  54 y/o male s/p R-Fem-Tib BPG who has a suspected groin wound infection.  Due to severe PCN allergy, Vancomycin will be continued for antibiotic coverage.  Pharmacy will manage.  Wt 92 kg, CrCl > 100. WBC 11.2, patient currently afebrile.  Goal of Therapy:   Vancomycin trough level 15-20 mcg/ml  Plan:  1. Continue Vancomycin post-op.   2. Vancomycin 1 gm IV q 8 hours. 3. Monitor renal function, WBC, fever curve, any cultures/sensitivities, Vancomycin levels as clinically indicated, and monitor clinical progression.  Kit Mollett, Colin Benton,  Pharm.D,    6/15/20164:22 PM

## 2015-05-27 NOTE — Anesthesia Postprocedure Evaluation (Signed)
  Anesthesia Post-op Note  Patient: Hector Green  Procedure(s) Performed: Procedure(s): THROMBECTOMY OF RIGHT FEMORAL-POSTERIOR TIBIAL ARTERY SAPHENOUS VEIN BYPASS GRAFT  (Right) INTRA OPERATIVE ARTERIOGRAM (Right)  Patient Location: PACU  Anesthesia Type:General  Level of Consciousness: awake and alert   Airway and Oxygen Therapy: Patient Spontanous Breathing  Post-op Pain: none  Post-op Assessment: Post-op Vital signs reviewed              Post-op Vital Signs: Reviewed  Last Vitals:  Filed Vitals:   05/27/15 1415  BP: 166/79  Pulse: 89  Temp:   Resp: 24    Complications: No apparent anesthesia complications

## 2015-05-27 NOTE — Anesthesia Procedure Notes (Signed)
Procedure Name: Intubation Date/Time: 05/27/2015 8:39 AM Performed by: Yvonne Kendall S Patient Re-evaluated:Patient Re-evaluated prior to inductionOxygen Delivery Method: Circle system utilized Preoxygenation: Pre-oxygenation with 100% oxygen Intubation Type: IV induction Ventilation: Mask ventilation without difficulty and Oral airway inserted - appropriate to patient size Laryngoscope Size: Miller and 2 Grade View: Grade I Tube type: Oral Tube size: 7.5 mm Number of attempts: 1 Tube secured with: Tape Dental Injury: Teeth and Oropharynx as per pre-operative assessment

## 2015-05-27 NOTE — H&P (Signed)
Vascular Surgery H&P  Chief Complaint: Rest pain right foot 2 weeks post femoral to posterior tibial bypass for severe occlusive disease  HPI: Hector Green is a 54 y.o. male who presents for evaluation of rest pain right foot. Patient had right common femoral endarterectomy and femoral to posterior tibial bypass using saphenous vein from left leg on 05/13/2015. Bypass occluded several hours later and return to OR found no technical problems with thrombectomy of bypass. Patient was discharged with good pulse in graft. 4 days ago he developed pain in the right foot and reported that to [place] last evening and now returns for exploration of graft and attempted thrombectomy and/or revision.   Past Medical History  Diagnosis Date  . CAD (coronary artery disease) 2011    Multivessel s/p CABG in [place]  . Essential hypertension   . Type 2 diabetes mellitus   . Hyperlipidemia   . Peripheral arterial disease   . Abnormal nuclear stress test 05/06/2015  . Myocardial infarction 2011   Past Surgical History  Procedure Laterality Date  . Coronary artery bypass graft  2011    Three vessel by report  . Abdominal aortagram N/A 03/30/2015    Procedure: ABDOMINAL [name];  Surgeon: Fransisco Hertz, MD;  Location: [facility] CATH LAB;  Service: Cardiovascular;  Laterality: N/A;  . Lower extremity angiogram N/A 03/30/2015    Procedure: LOWER EXTREMITY ANGIOGRAM;  Surgeon: Fransisco Hertz, MD;  Location: [facility] CATH LAB;  Service: Cardiovascular;  Laterality: N/A;  . Cardiac catheterization N/A 05/06/2015    Procedure: Left Heart Cath and Cors/Grafts Angiography;  Surgeon: Tonny Bollman, MD;  Location: [facility] INVASIVE CV LAB;  Service: Cardiovascular;  Laterality: N/A;  . Femoral-tibial bypass graft Right 05/13/2015    Procedure: BYPASS GRAFT RIGHT FEMORAL-POSTERIOR TIBIAL ARTERY USING LEFT NONREVERSED TRANSLOCATED GREATER SAPPHENOUS VEIN;  Surgeon: Pryor Ochoa, MD;  Location: [facility] OR;  Service: Vascular;  Laterality: Right;   . Vein harvest Left 05/13/2015    Procedure: LEFT GREATER SAPPHENOUS VEIN HARVEST;  Surgeon: Pryor Ochoa, MD;  Location: [facility] OR;  Service: Vascular;  Laterality: Left;  . Endarterectomy femoral Right 05/13/2015    Procedure: RIGHT COMMON FEMORAL, SUPERFICIAL FEMORAL AND PROFUNDA ENDARTERECTOMY ;  Surgeon: Pryor Ochoa, MD;  Location: [facility] OR;  Service: Vascular;  Laterality: Right;  . Patch angioplasty Right 05/13/2015    Procedure: RIGHT FEMORAL ARTERY PATCH ANGIOPLASTY;  Surgeon: Pryor Ochoa, MD;  Location: [facility] OR;  Service: Vascular;  Laterality: Right;  . Intraoperative arteriogram Right 05/13/2015    Procedure: RIGHT LOWER LEG INTRA OPERATIVE ARTERIOGRAM;  Surgeon: Pryor Ochoa, MD;  Location: [facility] OR;  Service: Vascular;  Laterality: Right;  . Thrombectomy femoral artery Right 05/13/2015    Procedure: THROMBECTOMY RIGHT FEMORAL-PROXIMAL POSTERIOR TIBAIL ARTERY BYPASS GRAFT;  Surgeon: Larina Earthly, MD;  Location: [facility] OR;  Service: Vascular;  Laterality: Right;  . Colonoscopy     History   Social History  . Marital Status: Single    Spouse Name: N/A  . Number of Children: N/A  . Years of Education: N/A   Social History Main Topics  . Smoking status: Light Tobacco Smoker -- 0.50 packs/day for 15 years    Types: Cigarettes    Start date: 08/24/1975  . Smokeless tobacco: Never Used  . Alcohol Use: No  . Drug Use: No  . Sexual Activity: Not on file   Other Topics Concern  . None   Social History Narrative   Family History  Problem Relation Age of Onset  . Cancer Father    Allergies  Allergen Reactions  . Fish-Derived Products Anaphylaxis  . Penicillins Anaphylaxis   Prior to Admission medications   Medication Sig Start Date End Date Taking? Authorizing Provider  aspirin EC 81 MG tablet Take 81 mg by mouth daily.    Historical Provider, MD  atorvastatin (LIPITOR) 80 MG tablet Take 1 tablet (80 mg total) by mouth daily. 04/29/15   Dyann Kief, PA-C  metFORMIN (GLUCOPHAGE)  1000 MG tablet Take 1,000 mg by mouth 2 (two) times daily with a meal.  04/16/15   Historical Provider, MD  metoprolol tartrate (LOPRESSOR) 25 MG tablet Take 0.5 tablets (12.5 mg total) by mouth daily. 04/29/15   Dyann Kief, PA-C  oxyCODONE (ROXICODONE) 5 MG immediate release tablet Take 1-2 tablets po. q 6 hrs/ prn 05/21/15   Sherren Kerns, MD  oxyCODONE-acetaminophen (PERCOCET/ROXICET) 5-325 MG per tablet Take 1 tablet by mouth every 4 (four) hours as needed for severe pain. 05/08/15   Fransisco Hertz, MD     Positive ROS: Denies any active chest pain dyspnea on exertion PND orthopnea hemoptysis  All other systems have been reviewed and were otherwise negative with the exception of those mentioned in the HPI and as above.  Physical Exam: There were no vitals filed for this visit.  General: Alert, no acute distress HEENT: Normal for age Cardiovascular: Regular rate and rhythm. Carotid pulses 2+, no bruits audible Respiratory: Clear to auscultation. No cyanosis, no use of accessory musculature GI: No organomegaly, abdomen is soft and non-tender Skin: No lesions in the area of chief complaint Neurologic: Sensation intact distally Psychiatric: Patient is competent for consent with normal mood and affect Musculoskeletal: No obvious deformities Extremities: Right lower extremity with 3+ femoral pulse. Inguinal incision healing adequately. Absent popliteal graft pulse in subcutaneous position medial aspect right leg. Right foot with dependent rubor with rest pain and decreased sensation. Surgical wound healing nicely. Left leg vein harvesting incisions healing nicely.    Imaging reviewed: Patient has no significant saphenous vein and right leg having had that harvested for one area artery bypass grafting in the past Vein has been removed from left leg except for very distal aspect near ankle Appears he may have upper arm cephalic vein on the left   Assessment/Plan:  Patient has rest  ischemia right foot with thrombosis of graft to times in past 2 weeks. This is saphenous vein graft to posterior tibial artery. Options are very limited. We'll attempt thrombectomy look for technical problems and possibly revised to more distal location on posterior tibial artery. Patient understands that failure could very well occur because of 2 previous failures in the last 2 weeks. If graft does not remain patent patient will likely require right leg amputation. He does understand this and would like to proceed.   Josephina Gip, MD 05/27/2015 7:47 AM

## 2015-05-27 NOTE — Op Note (Signed)
OPERATIVE REPORT  Date of Surgery: 05/27/2015  Surgeon: Josephina Gip, MD  Assistant: Doreatha Massed, PA  Pre-op Diagnosis: Occluded Right Femoral-Posterior Tibial Bypass Graft  with ischemic right legT82.392   Post-op Diagnosis: Occluded Right Femoral-Posterior Tibial Bypass Graft  with ischemic right legT82.392   Procedure: Procedure(s): THROMBECTOMY OF RIGHT FEMORAL-POSTERIOR TIBIAL ARTERY SAPHENOUS VEIN BYPASS GRAFT with exploration and revision of proximal and distal anastomosis INTRA OPERATIVE ARTERIOGRAMright leg  Anesthesia: General  EBL: 200 cc  Complications: None  The patient was taken to the operative room placed in supine position at which time satisfactory general endotracheal anesthesia was administered. The right leg and left upper extremity were both prepped with Betadine scrub and solution draped in routine sterile manner encased vein harvesting from the left upper extremity was necessary. Patient had had a recent femoral to posterior tibial saphenous vein graft with contralateral leg vein which had now occluded on 2 occasions. The inguinal incision on the right side was re-opened. There was some superficial purulent drainage in the subcutaneous tissue which did not extend deep but it was cultured. The external iliac artery was dissected free proximally for control and the entire common femoral artery and the origin superficial and profunda femoris arteries were also dissected free. The patient had had a endarterectomy of the extensive disease in the external iliac and common femoral artery with patch angioplasty and the vein graft was anastomosed to the patch. There was no evidence of any infection in this area being well incorporated. After dissecting all of this free attention was turned to the distal wound and the medial below-knee area this incision was reopened vein graft to the posterior tibial artery was dissected free with proximal and distal control the posterior  tibial artery. The vein graft was thrombosed. Following this the patient was heparinized. The vein graft was excised from the posterior tibial artery taking down the anastomosis which was filled with fresh thrombus. Fogarty would go to the ankle without difficulty with some thrombus being retrieved which was about 10 cm in length followed by good backbleeding. Several additional passes until no further clot in the distal vessel. The proximal posterior tibial artery was occluded a few centimeters proximal to the anastomosis. Vein graft itself was then thrombectomized although initially the Fogarty would not traverse the entire finger graft. At that point attention was returned to the groin where the vessels were all occluded with vascular clamps and the proximal anastomosis was taken down completely removing the sutures from where he did been anastomosed to the patch. Fresh thrombus filled the vein graft and the extended into the common femoral artery. This was all endarterectomized and thrombectomized after flushing with heparin saline leaving a nice smooth surface. There was excellent flow present. There was also good backbleeding coming from the profunda and the superficial femoral. The vein graft was then thrombectomized from above using 34 and 5 Fogarty catheters. There were a few areas that were difficult to traverse but eventually I was able to get through all of these areas and in fact a 2-1/2 and 3 mm dilator would traverse these areas from above and below. Following completion of this the proximal anastomosis was reperformed with the vein graft reanastomosed to the Tycron patch. There was good flow out of the distal end of the vein graft. There were 2 areas of mild concern because of the difficulty initially passing the Fogarty so short incisions were made in the proximal thigh and just at the knee level to examine these areas  of the vein graft. Following this the very distal end of the vein graft was  completely excised back to fresh vein and it would still comfortably reach the artery on the was clean and without any thrombus. The vein was reanastomosed to the posterior tibial artery with 60 proline clamps released there was an excellent pulse and excellent Doppler flow but only in the vein graft but also in the ankle posterior tibial artery. The areas of concern had excellent Doppler flow as well. Intraoperative arteriogram was then performed visualizing the entire graft down to the ankle with no evidence of any problems with the vein graft or the posterior tibial artery. No protamine was given wound were irrigated with saline Jackson-Pratt drains were brought out adjacent to both incisions secured with silk sutures and the wound closed in layers with interrupted 20 Vicryls 30 Vicryls and nylon for the groin and 2030 Vicryls in a subcuticular fashion for the distal wound. Patient taken recovery in stable condition Procedure Details:   Josephina Gip, MD 05/27/2015 12:37 PM

## 2015-05-27 NOTE — Transfer of Care (Signed)
Immediate Anesthesia Transfer of Care Note  Patient: Hector Green  Procedure(s) Performed: Procedure(s): THROMBECTOMY OF RIGHT FEMORAL-POSTERIOR TIBIAL ARTERY SAPHENOUS VEIN BYPASS GRAFT  (Right) INTRA OPERATIVE ARTERIOGRAM (Right)  Patient Location: PACU  Anesthesia Type:General  Level of Consciousness: awake, alert  and oriented  Airway & Oxygen Therapy: Patient Spontanous Breathing and Patient connected to nasal cannula oxygen  Post-op Assessment: Report given to RN and Post -op Vital signs reviewed and stable  Post vital signs: Reviewed and stable  Last Vitals:  Filed Vitals:   05/27/15 0807  BP: 150/74  Pulse: 75  Temp: 36.4 C  Resp: 20    Complications: No apparent anesthesia complications

## 2015-05-27 NOTE — Progress Notes (Signed)
Pt arrived to 3 south. dp pulse absent, pt dopplerable MD aware. Vital signs stable, no distress noted. Incisions intact with minimal drainage. Heparin gtt at 56ml/hr per order. Will continue to monitor.

## 2015-05-27 NOTE — Progress Notes (Signed)
Patient ID: Hector Green, male   DOB: 1961/02/16, 54 y.o.   MRN: [id number] Patient had fairly brisk posterior tibial Doppler flow at ankle level initially in PACU Flow has diminished somewhat in the ankle level but still has brisk flow and bypass graft Patient denies pain in right foot  Have given patient an additional 2000 units of heparin bolus and he is currently on 1000 units per hour All options have been exhausted for revascularization if this graft fails Continue heparin and observation for now but if graft fails likely patient will need amputation

## 2015-05-27 NOTE — Anesthesia Preprocedure Evaluation (Addendum)
Anesthesia Evaluation  Patient identified by MRN, date of birth, ID band Patient awake    Reviewed: Allergy & Precautions, H&P , NPO status , Patient's Chart, lab work & pertinent test results, reviewed documented beta blocker date and time   Airway Mallampati: II  TM Distance: >3 FB Neck ROM: Full    Dental no notable dental hx. (+) Poor Dentition, Dental Advisory Given, Missing, Loose   Pulmonary Current Smoker,  breath sounds clear to auscultation  Pulmonary exam normal       Cardiovascular hypertension, Pt. on medications and Pt. on home beta blockers + CAD, + CABG, + Peripheral Vascular Disease and +CHF (EF 35% on 04/2015 [facility]) Rhythm:Regular Rate:Normal     Neuro/Psych negative neurological ROS  negative psych ROS   GI/Hepatic negative GI ROS, Neg liver ROS,   Endo/Other  diabetes, Poorly Controlled, Type 2, Oral Hypoglycemic Agents  Renal/GU negative Renal ROS  negative genitourinary   Musculoskeletal   Abdominal   Peds  Hematology negative hematology ROS (+)   Anesthesia Other Findings   Reproductive/Obstetrics negative OB ROS                            Anesthesia Physical  Anesthesia Plan  ASA: III  Anesthesia Plan: General   Post-op Pain Management:    Induction: Intravenous  Airway Management Planned: Oral ETT  Additional Equipment:   Intra-op Plan:   Post-operative Plan: Extubation in OR  Informed Consent: I have reviewed the patients History and Physical, chart, labs and discussed the procedure including the risks, benefits and alternatives for the proposed anesthesia with the patient or authorized representative who has indicated his/her understanding and acceptance.   Dental advisory given  Plan Discussed with: CRNA  Anesthesia Plan Comments:         Anesthesia Quick Evaluation

## 2015-05-27 NOTE — Progress Notes (Signed)
UR COMPLETED  

## 2015-05-28 ENCOUNTER — Inpatient Hospital Stay (HOSPITAL_COMMUNITY): Payer: Medicare Other

## 2015-05-28 DIAGNOSIS — Z9889 Other specified postprocedural states: Secondary | ICD-10-CM

## 2015-05-28 LAB — BASIC METABOLIC PANEL
ANION GAP: 8 (ref 5–15)
BUN: 9 mg/dL (ref 6–20)
CHLORIDE: 101 mmol/L (ref 101–111)
CO2: 26 mmol/L (ref 22–32)
Calcium: 8.7 mg/dL — ABNORMAL LOW (ref 8.9–10.3)
Creatinine, Ser: 0.89 mg/dL (ref 0.61–1.24)
GFR calc Af Amer: >60 mL/min (ref >60)
GFR calc non Af Amer: >60 mL/min (ref >60)
GLUCOSE: 206 mg/dL — AB (ref 65–99)
Potassium: 4.7 mmol/L (ref 3.5–5.1)
Sodium: 135 mmol/L (ref 135–145)

## 2015-05-28 LAB — CBC
HEMATOCRIT: 33.9 % — AB (ref 39.0–52.0)
Hemoglobin: 11.7 g/dL — ABNORMAL LOW (ref 13.0–17.0)
MCH: 29.1 pg (ref 26.0–34.0)
MCHC: 34.5 g/dL (ref 30.0–36.0)
MCV: 84.3 fL (ref 78.0–100.0)
Platelets: 140 10*3/uL — ABNORMAL LOW (ref 150–400)
RBC: 4.02 MIL/uL — AB (ref 4.22–5.81)
RDW: 12.9 % (ref 11.5–15.5)
WBC: 10 10*3/uL (ref 4.0–10.5)

## 2015-05-28 LAB — GLUCOSE, CAPILLARY
GLUCOSE-CAPILLARY: 169 mg/dL — AB (ref 65–99)
Glucose-Capillary: 204 mg/dL — ABNORMAL HIGH (ref 65–99)
Glucose-Capillary: 225 mg/dL — ABNORMAL HIGH (ref 65–99)
Glucose-Capillary: 222 mg/dL — ABNORMAL HIGH (ref 65–99)

## 2015-05-28 LAB — PROTIME-INR
INR: 1.08 (ref 0.00–1.49)
Prothrombin Time: 14.2 seconds (ref 11.6–15.2)

## 2015-05-28 LAB — HEMOGLOBIN A1C
Hgb A1c MFr Bld: 11.1 % — ABNORMAL HIGH (ref 4.8–5.6)
Mean Plasma Glucose: 272 mg/dL

## 2015-05-28 LAB — APTT: aPTT: 35 seconds (ref 24–37)

## 2015-05-28 LAB — HEPARIN LEVEL (UNFRACTIONATED)

## 2015-05-28 MED ORDER — COUMADIN BOOK
Freq: Once | Status: AC
  Administered 2015-05-28: 18:00:00
  Filled 2015-05-28 (×2): qty 1

## 2015-05-28 MED ORDER — WARFARIN SODIUM 7.5 MG PO TABS
7.5000 mg | ORAL_TABLET | Freq: Every day | ORAL | Status: DC
  Administered 2015-05-28: 7.5 mg via ORAL
  Filled 2015-05-28 (×2): qty 1

## 2015-05-28 MED ORDER — PRO-STAT 64 PO LIQD
30.0000 mL | Freq: Two times a day (BID) | ORAL | Status: DC
Start: 2015-05-28 — End: 2015-05-29
  Filled 2015-05-28 (×4): qty 30

## 2015-05-28 MED ORDER — WARFARIN VIDEO
Freq: Once | Status: AC
  Administered 2015-05-28: 11:00:00

## 2015-05-28 NOTE — Progress Notes (Signed)
Inpatient Diabetes Program Recommendations  AACE/ADA: New Consensus Statement on Inpatient Glycemic Control (2013)  Target Ranges:  Prepandial:   less than 140 mg/dL      Peak postprandial:   less than 180 mg/dL (1-2 hours)      Critically ill patients:  140 - 180 mg/dL   Reason for Visit: Diabetes Consult  Diabetes history: DM2 Outpatient Diabetes medications: metformin 1000 mg bid Current orders for Inpatient glycemic control: Novolog moderate tidwc  Results for NICKOLES, GREGORI (MRN [id number]) as of 05/28/2015 15:54  Ref. Range 05/27/2015 07:59 05/27/2015 09:54 05/27/2015 10:50 05/27/2015 11:56 05/27/2015 12:31 05/27/2015 15:56 05/27/2015 21:24 05/28/2015 08:05 05/28/2015 12:22  Glucose-Capillary Latest Ref Range: 65-99 mg/dL 346 (H) 244 (H) 253 (H) 200 (H) 185 (H) 232 (H) 171 (H) 204 (H) 225 (H)   Results for MONTRAE, BRAITHWAITE (MRN [id number]) as of 05/28/2015 15:54  Ref. Range 05/27/2015 19:06  Hemoglobin A1C Latest Ref Range: 4.8-5.6 % 11.1 (H)    Poor control prior to admission. Will likely need insulin at discharge.  Recommendations: Add Lantus 15 units QHS Add Novolog 3 units tidwc for meal coverage insulin Add Novolog HS correction  Will order Living Well With Diabetes book and OP Diabetes Education consult for uncontrolled DM. If pt is to go home on insulin, will order insulin starter kit and RN to begin teaching insulin administration. Will speak with pt in am regarding his glycemic control.  Thank you. Lorenda Peck, RD, LDN, CDE Inpatient Diabetes Coordinator [phone]

## 2015-05-28 NOTE — Progress Notes (Signed)
OT Cancellation Note  Patient Details Name: REDDINGTON GRIECO MRN: [id number] DOB: 1961-04-09   Cancelled Treatment:    Reason Eval/Treat Not Completed: Medical issues which prohibited therapy - Per RN, pt to remain bedrest today.  Will follow up tomorrow.   Angelene Giovanni [place], OTR/L [phone]  05/28/2015, 1:26 PM

## 2015-05-28 NOTE — Progress Notes (Addendum)
  Progress Note    05/28/2015 7:27 AM 1 Day Post-Op  Subjective:  C/o soreness  Tm 99.8 and now afebrile HR 70's-80's NSR 120's-160's systolic 98% RA  Filed Vitals:   05/28/15 0417  BP:   Pulse:   Temp: 98.2 F (36.8 C)  Resp:     Physical Exam: Cardiac:  regular Lungs:  Non labored Incisions:  C/d/i Extremities:  Brisk doppler flow right PT/DP; right foot more pink and less mottled   CBC    Component Value Date/Time   WBC 10.0 05/28/2015 0340   RBC 4.02* 05/28/2015 0340   HGB 11.7* 05/28/2015 0340   HCT 33.9* 05/28/2015 0340   PLT 140* 05/28/2015 0340   MCV 84.3 05/28/2015 0340   MCH 29.1 05/28/2015 0340   MCHC 34.5 05/28/2015 0340   RDW 12.9 05/28/2015 0340   LYMPHSABS 2.6 04/29/2015 1427   MONOABS 0.7 04/29/2015 1427   EOSABS 0.3 04/29/2015 1427   BASOSABS 0.0 04/29/2015 1427    BMET    Component Value Date/Time   NA 135 05/28/2015 0340   K 4.7 05/28/2015 0340   CL 101 05/28/2015 0340   CO2 26 05/28/2015 0340   GLUCOSE 206* 05/28/2015 0340   BUN 9 05/28/2015 0340   CREATININE 0.89 05/28/2015 0340   CREATININE 0.96 04/29/2015 1427   CALCIUM 8.7* 05/28/2015 0340   GFRNONAA >60 05/28/2015 0340   GFRAA >60 05/28/2015 0340    INR    Component Value Date/Time   INR 1.08 05/28/2015 0340     Intake/Output Summary (Last 24 hours) at 05/28/15 0727 Last data filed at 05/28/15 0646  Gross per 24 hour  Intake 4547.84 ml  Output   2198 ml  Net 2349.84 ml     Assessment:  54 y.o. male is s/p:  THROMBECTOMY OF RIGHT FEMORAL-POSTERIOR TIBIAL ARTERY SAPHENOUS VEIN BYPASS GRAFT with exploration and revision of proximal and distal anastomosis INTRA OPERATIVE ARTERIOGRAM right leg  1 Day Post-Op  Plan: -pt with patent bypass graft this morning.  Flow at the ankle is improved from yesterday afternoon. -DVT prophylaxis:  Heparin gtt -drains with minimal drainage, but will leave them another day given heparin gtt -received dose of coumadin 10mg  last  with INR 1.08 today.  Will have pharmacy dose coumadin today. -dc foley    Doreatha Massed, PA-C [facility] [phone] 05/28/2015 7:27 AM Review with above assessment 3+ pulse palpable in right femoral to posterior tibial vein graft with loud biphasic flow in the right posterior tibial and ankle No rest pain right foot states it feels much better Currently on heparin at 1200 units per hour-will check heparin level at 1 PM and possibly increased dose Coumadin started yesterday We'll transferred to [place] and keep the wrist today Plan DC Jackson-Pratt drains in a.m. and begin out of bed  Bypasses quite tenuous and if it fails again no further options available

## 2015-05-28 NOTE — Progress Notes (Signed)
Pt transferred by bed to 2W26 in stable condition; awake, alert, oriented, and conversed. Offered to notify family members but declined saying will contact himself.

## 2015-05-28 NOTE — Progress Notes (Signed)
PT Cancellation Note  Patient Details Name: Hector Green MRN: [id number] DOB: 11/23/1961   Cancelled Treatment:    Reason Eval/Treat Not Completed: Patient not medically ready.  Per RN, pt to remain bedrest today. Will follow up tomorrow.  05/28/2015  [place] Bing, PT [phone]  (pager)   Sadiya Durand, Eliseo Gum 05/28/2015, 3:22 PM

## 2015-05-28 NOTE — Progress Notes (Signed)
VASCULAR LAB PRELIMINARY  ARTERIAL  ABI completed:    RIGHT    LEFT    PRESSURE WAVEFORM  PRESSURE WAVEFORM  BRACHIAL 129 Triphasic BRACHIAL 126 Triphasic  DP 0  DP 76 biphasic  PT 42 Monophasic PT 87 biphasic    RIGHT LEFT  ABI .33 .67     Robb Sibal D, RVT 05/28/2015, 9:58 AM

## 2015-05-28 NOTE — Progress Notes (Signed)
Initial Nutrition Assessment  DOCUMENTATION CODES:  Non-severe (moderate) malnutrition in context of acute illness/injury  INTERVENTION:  Prostat BID  NUTRITION DIAGNOSIS:  Malnutrition related to wound healing, acute illness as evidenced by mild depletion of muscle mass, mild depletion of body fat.  GOAL:  Patient will meet greater than or equal to 90% of their needs  MONITOR:  PO intake, Labs, Weight trends, I & O's, Skin  REASON FOR ASSESSMENT:  Malnutrition Screening Tool    ASSESSMENT: 54 y.o. male who presents for evaluation of pain right foot. Patient had right common femoral endarterectomy and femoral to posterior tibial bypass 05/13/2015. Patient was discharged with good pulse in graft. 4 days ago he developed pain in the right foot. Pt s/p THROMBECTOMY OF RIGHT FEMORAL-POSTERIOR TIBIAL ARTERY SAPHENOUS VEIN BYPASS GRAFT with exploration and revision of proximal and distal anastomosis.   Pt states he has been in a lot of stress over the leg and not eating well. Per pt his usual wt is 190 - 200 Lb, current is 198 Lb. However, in May he lost11 Lb in 1 week (>5%, significant for time frame). Pt states he usually eats just 2 meals per day, encouraged including some snacks, especially while recovering from current issues. Pt states he does not like the food here, but is trying to eat everything that he receives, no PO recorded in nursing notes. Nutrition focused exam showed mild fat and muscle depletion. Pt refuses Ensure and similar products, but is agreeable to Prostat BID. Will continue to monitor. Labs reviewed: Glu 204 - 225, Ca 8.7  Height:  Ht Readings from Last 1 Encounters:  05/27/15 5' 9.5" (1.765 m)    Weight:  Wt Readings from Last 1 Encounters:  05/27/15 198 lb 13.7 oz (90.201 kg)    Ideal Body Weight:  74 kg  Wt Readings from Last 10 Encounters:  05/27/15 198 lb 13.7 oz (90.201 kg)  05/14/15 198 lb 13.7 oz (90.2 kg)  05/08/15 189 lb (85.73 kg)   05/06/15 185 lb (83.915 kg)  04/29/15 196 lb 6.4 oz (89.086 kg)  04/22/15 197 lb (89.359 kg)  03/27/15 201 lb 11.2 oz (91.491 kg)    BMI:  Body mass index is 28.95 kg/(m^2).  Estimated Nutritional Needs:  Kcal:  2000 - 2200  Protein:  110 - 120 g  Fluid:  2.0 L  Skin:    Surgical incisions  Diet Order:  Diet heart healthy/carb modified Room service appropriate?: Yes; Fluid consistency:: Thin  EDUCATION NEEDS:  Education needs addressed   Intake/Output Summary (Last 24 hours) at 05/28/15 1315 Last data filed at 05/28/15 1255  Gross per 24 hour  Intake 2647.84 ml  Output   3208 ml  Net -560.16 ml    Last BM:  6/15  Zanaya Baize A. [facility] Dietetic Intern Pager: [phone] 05/28/2015 1:26 PM

## 2015-05-29 ENCOUNTER — Encounter (HOSPITAL_COMMUNITY): Payer: Self-pay | Admitting: Vascular Surgery

## 2015-05-29 ENCOUNTER — Encounter: Payer: Self-pay | Admitting: Vascular Surgery

## 2015-05-29 ENCOUNTER — Telehealth: Payer: Self-pay | Admitting: *Deleted

## 2015-05-29 ENCOUNTER — Other Ambulatory Visit: Payer: Self-pay | Admitting: *Deleted

## 2015-05-29 DIAGNOSIS — Z7901 Long term (current) use of anticoagulants: Secondary | ICD-10-CM

## 2015-05-29 DIAGNOSIS — E440 Moderate protein-calorie malnutrition: Secondary | ICD-10-CM | POA: Insufficient documentation

## 2015-05-29 LAB — APTT: APTT: 52 s — AB (ref 24–37)

## 2015-05-29 LAB — GLUCOSE, CAPILLARY
GLUCOSE-CAPILLARY: 190 mg/dL — AB (ref 65–99)
Glucose-Capillary: 299 mg/dL — ABNORMAL HIGH (ref 65–99)

## 2015-05-29 LAB — HEPARIN LEVEL (UNFRACTIONATED): Heparin Unfractionated: <0.1 IU/mL — ABNORMAL LOW (ref 0.30–0.70)

## 2015-05-29 LAB — PROTIME-INR
INR: 1.34 (ref 0.00–1.49)
Prothrombin Time: 16.7 seconds — ABNORMAL HIGH (ref 11.6–15.2)

## 2015-05-29 MED ORDER — LEVOFLOXACIN 750 MG PO TABS: 750.0000 mg | ORAL_TABLET | Freq: Every day | ORAL | Status: DC

## 2015-05-29 MED ORDER — INSULIN GLARGINE 100 UNIT/ML ~~LOC~~ SOLN
15.0000 [IU] | Freq: Every day | SUBCUTANEOUS | Status: DC
  Filled 2015-05-29: qty 0.15

## 2015-05-29 MED ORDER — WARFARIN SODIUM 7.5 MG PO TABS
7.5000 mg | ORAL_TABLET | Freq: Once | ORAL | Status: DC
Start: 2015-05-29 — End: 2015-05-29
  Filled 2015-05-29: qty 1

## 2015-05-29 MED ORDER — OXYCODONE HCL 5 MG PO TABS: ORAL_TABLET | ORAL | Status: DC

## 2015-05-29 MED ORDER — WARFARIN SODIUM 5 MG PO TABS: 5.0000 mg | ORAL_TABLET | Freq: Every day | ORAL | Status: DC

## 2015-05-29 MED ORDER — INSULIN ASPART 100 UNIT/ML ~~LOC~~ SOLN
3.0000 [IU] | Freq: Three times a day (TID) | SUBCUTANEOUS | Status: DC
  Administered 2015-05-29: 3 [IU] via SUBCUTANEOUS

## 2015-05-29 NOTE — Progress Notes (Signed)
ANTICOAGULATION / ANTIBIOTIC CONSULT NOTE   Pharmacy Consult for Heparin, Vancomycin Indication: PVD  Allergies  Allergen Reactions  . Fish-Derived Products Anaphylaxis  . Penicillins Anaphylaxis    Patient Measurements: Height: 5' 9.5" (176.5 cm) Weight: 198 lb 13.7 oz (90.201 kg) IBW/kg (Calculated) : 71.85  Vital Signs: Temp: 98.6 F (37 C) (06/16 2153) Temp Source: Oral (06/16 2153) BP: 144/58 mmHg (06/16 2153) Pulse Rate: 73 (06/16 2153)  Labs:  Recent Labs  05/27/15 0748 05/27/15 1906 05/28/15 0340 05/28/15 1426 05/29/15 0331  HGB 13.9 11.8* 11.7*  --   --   HCT 40.1 34.2* 33.9*  --   --   PLT 193 170 140*  --   --   APTT 26  --  35  --  52*  LABPROT 13.2  --  14.2  --  16.7*  INR 0.98  --  1.08  --  1.34  HEPARINUNFRC  --  <0.10*  --  <0.10* <0.10*  CREATININE 0.90  --  0.89  --   --     Estimated Creatinine Clearance: 108.8 mL/min (by C-G formula based on Cr of 0.89).   Medical History: Past Medical History  Diagnosis Date  . CAD (coronary artery disease) 2011    Multivessel s/p CABG in [place]  . Essential hypertension   . Type 2 diabetes mellitus   . Hyperlipidemia   . Peripheral arterial disease   . Abnormal nuclear stress test 05/06/2015  . Myocardial infarction 2011    Medications:  Prescriptions prior to admission  Medication Sig Dispense Refill Last Dose  . aspirin EC 81 MG tablet Take 81 mg by mouth daily.   05/20/2015  . atorvastatin (LIPITOR) 80 MG tablet Take 1 tablet (80 mg total) by mouth daily. 90 tablet 3 05/20/2015  . metFORMIN (GLUCOPHAGE) 1000 MG tablet Take 1,000 mg by mouth 2 (two) times daily with a meal.   0 05/20/2015  . metoprolol tartrate (LOPRESSOR) 25 MG tablet Take 0.5 tablets (12.5 mg total) by mouth daily. 45 tablet 3 05/20/2015 at 0800  . oxyCODONE (ROXICODONE) 5 MG immediate release tablet Take 1-2 tablets po. q 6 hrs/ prn 30 tablet 0 05/26/2015 at Unknown time    Assessment: 54 yo M admitted 05/27/2015 with pain  in right foot.  S/p femoral endarterectomy and posterior tibial bypass on 6/1, returned with foot pain and required thrombectomy.  Pharmacy consulted to dose vancomycin and heparin.  PMH: CAD, CABG in 2011, HTN, DM, HLD, PAD, MI  Coag/Heme: s/p fempop bypass graft, starting warfarin.  Patient educated, no PCP, attempting to identify plan for follow up of INR.  Heparin level < goal on 1200 units/hr.  INR 1.34, last CBC 6/16, low but stable, platelets trending down.  ID: afebrile, WBC up on admission, trending down, superficial purulent drainage from right inguinal wound at the time of surgery.  Wound with few GPC, anaphylaxis to penicillin, plan to discharge on 10d of oral antibiotics : Consider Levofloxacin 750 mg PO daily.  Goal of Therapy:  Heparin level 0.3-0.7 units/ml  Vancomycin trough 15-20 mcg/ml Monitor platelets by anticoagulation protocol: Yes   Plan:  Increase heparin to 1600 units/hr, no bolus with recent surgery. Check heparin level 6h after rate change Spoke with Lelon Mast, plan to transition warfarin dosing to pharmacy tomorrow, per MD for today Daily INR, Heparin level, CBC  Thank you for allowing pharmacy to be a part of this patients care team.  Lovenia Kim Pharm.D., BCPS, AQ-Cardiology Clinical Pharmacist 05/29/2015  9:55 AM Pager: [phone] Phone: [phone]

## 2015-05-29 NOTE — Telephone Encounter (Signed)
Our office, Dr. Candie Chroman, is trying to make an appt for Diabetes and Coumadin management with [facility] in [place] for [name]. We will contact our PA when appt is finalized.

## 2015-05-29 NOTE — Progress Notes (Signed)
PT Cancellation Note  Patient Details Name: RAHAT VOORHEES MRN: [id number] DOB: [birth year]   Cancelled Treatment:    Reason Eval/Treat Not Completed: Patient at procedure or test/unavailable. Currently working with OT. Will return today.   Tung Pustejovsky 05/29/2015, 10:10 AM Pager [phone]

## 2015-05-29 NOTE — Progress Notes (Signed)
Inpatient Diabetes Program Recommendations  AACE/ADA: New Consensus Statement on Inpatient Glycemic Control (2013)  Target Ranges:  Prepandial:   less than 140 mg/dL      Peak postprandial:   less than 180 mg/dL (1-2 hours)      Critically ill patients:  140 - 180 mg/dL   Reason for Visit: Hyperglycemia  Results for TMOTHY, PAPALEO (MRN [id number]) as of 05/29/2015 13:15  Ref. Range 05/28/2015 08:05 05/28/2015 12:22 05/28/2015 17:22 05/28/2015 21:49 05/29/2015 06:47  Glucose-Capillary Latest Ref Range: 65-99 mg/dL 940 (H) 768 (H) 088 (H) 169 (H) 299 (H)     Results for TAURIN, GUILFOYLE (MRN [id number]) as of 05/29/2015 13:15  Ref. Range 05/27/2015 19:06  Hemoglobin A1C Latest Ref Range: 4.8-5.6 % 11.1 (H)   Briefly spoke with pt regarding his diabetes control. Stressed importance of f/u with PCP to manage his diabetes. Pt was polite, but did not want to talk about diabetes, states he is leaving AMA. "I need to get home, can't explain why, but I just need to get home to my 49 year old son."  States he has appt to f/u with PCP. Thank you. Ailene Ards, RD, LDN, CDE Inpatient Diabetes Coordinator [phone]

## 2015-05-29 NOTE — Progress Notes (Signed)
All DC instructions and prescriptions given and reviewed, with much emphasis.  Follow up appts in place and all questions answered.  Will wheel Pt to car as he plans to drive himself home.

## 2015-05-29 NOTE — Discharge Instructions (Signed)
Information on my medicine - Coumadin   (Warfarin)  This medication education was reviewed with me or my healthcare representative as part of my discharge preparation.  The pharmacist that spoke with me during my hospital stay was:  Kaliya Shreiner C, RPH  Why was Coumadin prescribed for you? Coumadin was prescribed for you because you have a blood clot or a medical condition that can cause an increased risk of forming blood clots. Blood clots can cause serious health problems by blocking the flow of blood to the heart, lung, or brain. Coumadin can prevent harmful blood clots from forming. As a reminder your indication for Coumadin is:   Deep Vein Thrombosis Treatment  What test will check on my response to Coumadin? While on Coumadin (warfarin) you will need to have an INR test regularly to ensure that your dose is keeping you in the desired range. The INR (international normalized ratio) number is calculated from the result of the laboratory test called prothrombin time (PT).  If an INR APPOINTMENT HAS NOT ALREADY BEEN MADE FOR YOU please schedule an appointment to have this lab work done by your health care provider within 7 days. Your INR goal is usually a number between:  2 to 3 or your provider may give you a more narrow range like 2-2.5.  Ask your health care provider during an office visit what your goal INR is.  What  do you need to  know  About  COUMADIN? Take Coumadin (warfarin) exactly as prescribed by your healthcare provider about the same time each day.  DO NOT stop taking without talking to the doctor who prescribed the medication.  Stopping without other blood clot prevention medication to take the place of Coumadin may increase your risk of developing a new clot or stroke.  Get refills before you run out.  What do you do if you miss a dose? If you miss a dose, take it as soon as you remember on the same day then continue your regularly scheduled regimen the next day.  Do not  take two doses of Coumadin at the same time.  Important Safety Information A possible side effect of Coumadin (Warfarin) is an increased risk of bleeding. You should call your healthcare provider right away if you experience any of the following: ? Bleeding from an injury or your nose that does not stop. ? Unusual colored urine (red or dark brown) or unusual colored stools (red or black). ? Unusual bruising for unknown reasons. ? A serious fall or if you hit your head (even if there is no bleeding).  Some foods or medicines interact with Coumadin (warfarin) and might alter your response to warfarin. To help avoid this: ? Eat a balanced diet, maintaining a consistent amount of Vitamin K. ? Notify your provider about major diet changes you plan to make. ? Avoid alcohol or limit your intake to 1 drink for women and 2 drinks for men per day. (1 drink is 5 oz. wine, 12 oz. beer, or 1.5 oz. liquor.)  Make sure that ANY health care provider who prescribes medication for you knows that you are taking Coumadin (warfarin).  Also make sure the healthcare provider who is monitoring your Coumadin knows when you have started a new medication including herbals and non-prescription products.  Coumadin (Warfarin)  Major Drug Interactions  Increased Warfarin Effect Decreased Warfarin Effect  Alcohol (large quantities) Antibiotics (esp. Septra/Bactrim, Flagyl, Cipro) Amiodarone (Cordarone) Aspirin (ASA) Cimetidine (Tagamet) Megestrol (Megace) NSAIDs (ibuprofen, naproxen, etc.)  Piroxicam (Feldene) °Propafenone (Rythmol SR) °Propranolol (Inderal) °Isoniazid (INH) °Posaconazole (Noxafil) Barbiturates (Phenobarbital) °Carbamazepine (Tegretol) °Chlordiazepoxide (Librium) °Cholestyramine (Questran) °Griseofulvin °Oral Contraceptives °Rifampin °Sucralfate (Carafate) °Vitamin K  ° °Coumadin® (Warfarin) Major Herbal Interactions  °Increased Warfarin Effect Decreased Warfarin Effect  °Garlic °Ginseng °Ginkgo biloba  Coenzyme Q10 °Green tea °St. Darrius’s wort   ° °Coumadin® (Warfarin) FOOD Interactions  °Eat a consistent number of servings per week of foods HIGH in Vitamin K °(1 serving = ½ cup)  °Collards (cooked, or boiled & drained) °Kale (cooked, or boiled & drained) °Mustard greens (cooked, or boiled & drained) °Parsley *serving size only = ¼ cup °Spinach (cooked, or boiled & drained) °Swiss chard (cooked, or boiled & drained) °Turnip greens (cooked, or boiled & drained)  °Eat a consistent number of servings per week of foods MEDIUM-HIGH in Vitamin K °(1 serving = 1 cup)  °Asparagus (cooked, or boiled & drained) °Broccoli (cooked, boiled & drained, or raw & chopped) °Brussel sprouts (cooked, or boiled & drained) *serving size only = ½ cup °Lettuce, raw (green leaf, endive, romaine) °Spinach, raw °Turnip greens, raw & chopped  ° °These websites have more information on Coumadin (warfarin):  www.coumadin.com; °www.ahrq.gov/consumer/coumadin.htm; ° ° ° °

## 2015-05-29 NOTE — Discharge Summary (Signed)
Discharge Summary     Hector Green [birth year] 54 y.o. male  [id number]  Admission Date: 05/27/2015  Discharge Date: 05/29/15  Physician: Pryor Ochoa, MD  Admission Diagnosis: Occluded Right Femoral-Posterior Tibial BPG  T82.392    HPI:   This is a 54 y.o. male who presents for evaluation of rest pain right foot. Patient had right common femoral endarterectomy and femoral to posterior tibial bypass using saphenous vein from left leg on 05/13/2015. Bypass occluded several hours later and return to OR found no technical problems with thrombectomy of bypass. Patient was discharged with good pulse in graft. 4 days ago he developed pain in the right foot and reported that to [place] last evening and now returns for exploration of graft and attempted thrombectomy and/or revision.  Hospital Course:  The patient was admitted to the hospital and taken to the operating room on 05/27/2015 and underwent: THROMBECTOMY OF RIGHT FEMORAL-POSTERIOR TIBIAL ARTERY SAPHENOUS VEIN BYPASS GRAFT with exploration and revision of proximal and distal anastomosis INTRA OPERATIVE ARTERIOGRAM right leg    The pt tolerated the procedure well and was transported to the PACU in good condition.   In the recovery room, he had a fairly brisk PT doppler signal at the ankle level initially in PACU.  Flow had diminished at the ankle level, but still had brisk flow at the bypass graft.  He was given an additional 2000 units of heparin bolus.  He was on 1000 units/hour and this was increased to 1200 units per hour.  By POD 1, flow at the ankle had improved from the previous afternoon.  He was started on coumadin.    ABI's on 05/28/15:  RIGHT    LEFT    PRESSURE WAVEFORM  PRESSURE WAVEFORM  BRACHIAL 129 Triphasic BRACHIAL 126 Triphasic  DP 0  DP 76 biphasic  PT 42 Monophasic PT 87 biphasic    RIGHT LEFT  ABI .33 .67       A diabetic coordinator consult was obtained to obtain  better control of his diabetes.  On POD 2, wound gram stain revealed few gram positive cocci in pairs and clusters and multiple organisms present on cx. Pt is on Vancomycin. It was made clear to the pt several times that if this bypass re-occludes in the short-term that no further attempts at revascularization will be made since he has had 3 operations in 2 weeks.  On 05/29/15, The pt was wanting to leave AMA.   He states that he has to get out of this hospital now not for what anyone has done, but for personal reasons. I have told him that there are no further options for revascularization if his graft clots off. He understands that he is on IV heparin to thin his blood until the coumadin is therapeutic.   I have asked him if what he needs to tend to is more important than potentially losing his leg and he said yes. He completely understands that if his graft clots off, he is at very high risk for limb loss.   He also understands that there was potential infection in his right groin with dacron patch material. He understands that he has potential for getting very sick from this.  I have stated that we have made an appointment for him at [facility] on 06/02/15 at 1515 for INR check and management of uncontrolled diabetes. He promises me he will make this appointment. I have explained to him the importance of having his  INR checked to make sure his blood is not too thin or thick. He expresses understanding.  Will discharge him against our recommendation on Coumadin  daily. Spoke with Raynelle Fanning in the pharmacy again and she recommends Levaquin  as it has the least interaction of possible antibiotics suitable for Hector Green.  He will need to f/u with Dr. Hart Rochester in 2 weeks.  Intraoperative wound cx revealed multiple organisms present, none dominant.  No staph aureus or group A strep present.  No anaerobes were isolated.  Dr. Hart Rochester also spoke with the pt to reiterate  the above situation.  Pt expresses understanding and is discharged AMA.  The diabetic coordinator also spoke with the pt before discharge regarding his diabetes control. Stressed importance of f/u with PCP to manage his diabetes. Pt was polite, but did not want to talk about diabetes, states he is leaving AMA. "I need to get home, can't explain why, but I just need to get home to my 53 year old son."   The remainder of the hospital course consisted of increasing mobilization and increasing intake of solids without difficulty.  CBC    Component Value Date/Time   WBC 10.0 05/28/2015 0340   RBC 4.02* 05/28/2015 0340   HGB 11.7* 05/28/2015 0340   HCT 33.9* 05/28/2015 0340   PLT 140* 05/28/2015 0340   MCV 84.3 05/28/2015 0340   MCH 29.1 05/28/2015 0340   MCHC 34.5 05/28/2015 0340   RDW 12.9 05/28/2015 0340   LYMPHSABS 2.6 04/29/2015 1427   MONOABS 0.7 04/29/2015 1427   EOSABS 0.3 04/29/2015 1427   BASOSABS 0.0 04/29/2015 1427    BMET    Component Value Date/Time   NA 135 05/28/2015 0340   K 4.7 05/28/2015 0340   CL 101 05/28/2015 0340   CO2 26 05/28/2015 0340   GLUCOSE 206* 05/28/2015 0340   BUN 9 05/28/2015 0340   CREATININE 0.89 05/28/2015 0340   CREATININE 0.96 04/29/2015 1427   CALCIUM 8.7* 05/28/2015 0340   GFRNONAA >60 05/28/2015 0340   GFRAA >60 05/28/2015 0340       Discharge Instructions    Call MD for:  redness, tenderness, or signs of infection (pain, swelling, bleeding, redness, odor or green/yellow discharge around incision site)    Complete by:  As directed      Call MD for:  severe or increased pain, loss or decreased feeling  in affected limb(s)    Complete by:  As directed      Call MD for:  temperature >100.5    Complete by:  As directed      Discharge wound care:    Complete by:  As directed   Wash the groin wound with soap and water daily and pat dry. (No tub bath-only shower)  Then put a dry gauze or washcloth there to keep this area dry daily and as  needed.  Do not use Vaseline or neosporin on your incisions.  Only use soap and water on your incisions and then protect and keep dry.     Driving Restrictions    Complete by:  As directed   No driving for 2 weeks     Lifting restrictions    Complete by:  As directed   No lifting for 5 weeks     Resume previous diet    Complete by:  As directed            Discharge Diagnosis:  Occluded Right Femoral-Posterior Tibial BPG  T82.392   Secondary  Diagnosis: Patient Active Problem List   Diagnosis Date Noted  . Malnutrition of moderate degree 05/29/2015  . Femoral-tibial bypass graft occlusion, right 05/27/2015  . PAD (peripheral artery disease) 05/12/2015  . Type II diabetes mellitus with complication, uncontrolled 05/08/2015  . Abnormal nuclear stress test 05/06/2015  . CAD (coronary artery disease) of artery bypass graft 04/29/2015  . Hyperlipidemia 04/29/2015  . Essential hypertension 04/29/2015  . Tobacco abuse 04/29/2015  . Critical lower limb ischemia 03/31/2015  . Atherosclerosis of native arteries of extremity with rest pain 03/30/2015   Past Medical History  Diagnosis Date  . CAD (coronary artery disease) 2011    Multivessel s/p CABG in [place]  . Essential hypertension   . Type 2 diabetes mellitus   . Hyperlipidemia   . Peripheral arterial disease   . Abnormal nuclear stress test 05/06/2015  . Myocardial infarction 2011       Medication List    TAKE these medications        aspirin EC 81 MG tablet  Take 81 mg by mouth daily.     atorvastatin 80 MG tablet  Commonly known as:  LIPITOR  Take 1 tablet (80 mg total) by mouth daily.     levofloxacin 750 MG tablet  Commonly known as:  LEVAQUIN  Take 1 tablet (750 mg total) by mouth daily.     metFORMIN 1000 MG tablet  Commonly known as:  GLUCOPHAGE  Take 1,000 mg by mouth 2 (two) times daily with a meal.     metoprolol tartrate 25 MG tablet  Commonly known as:  LOPRESSOR  Take 0.5 tablets (12.5 mg  total) by mouth daily.     oxyCODONE 5 MG immediate release tablet  Commonly known as:  ROXICODONE  Take 1-2 tablets po. q 6 hrs/ prn     warfarin 5 MG tablet  Commonly known as:  COUMADIN  Take 1 tablet (5 mg total) by mouth daily.        Prescriptions given: 1.  Roxicodone #30 No Refill 2.  Coumadin  daily 3.  Levaquin  daily x 10 days  Instructions: 1.  Wash the groin wound with soap and water daily and pat dry. (No tub bath-only shower)  Then put a dry gauze or washcloth there to keep this area dry daily and as needed.  Do not use Vaseline or neosporin on your incisions.  Only use soap and water on your incisions and then protect and keep dry.  Disposition: home against advice of Dr.Lawson  Patient's condition: is Fair  Follow up: 1. Dr. Hart Rochester in 2 weeks 2.  [name] 06/02/15 @ 1515.  Expressed importance of this visit due to interaction b/w Coumadin and Levaquin as well as the importance of getting his diabetes under better control.  He states he will make this appointment.   Doreatha Massed, PA-C [facility] [phone] 05/29/2015  1:14 PM  - For VQI Registry use --- Instructions: Press F2 to tab through selections.  Delete question if not applicable.   Post-op:  Wound infection:  possibly  Graft infection: No  Transfusion: No  If yes, n/a units given New Arrhythmia: No Ipsilateral amputation: No,  Minor,  BKA,  AKA Discharge patency:  Primary, [x ] Primary assisted,  Secondary,  Occluded Patency judged by: [ x] Dopper only,  Palpable graft pulse,  Palpable distal pulse,  ABI inc. > 0.15,  Duplex Discharge  ABI: R 0.33, L 0.67 Discharge TBI: R , L  D/C Ambulatory Status: Ambulatory with Assistance  Complications: MI: No, [ ]  Troponin only, [ ]  EKG or Clinical CHF: No Resp failure:No, [ ]  Pneumonia, [ ]  Ventilator Chg in renal function: No, [ ]  Inc. Cr > 0.5, [ ]  Temp. Dialysis,  [ ]  Permanent dialysis Stroke: No, [ ]  Minor, [ ]  Major Return to OR: No  Reason for return to OR: [ ]  Bleeding, [ ]  Infection, [ ]  Thrombosis, [ ]  Revision  Discharge medications: Statin use:  yes ASA use:  yes Plavix use:  no Beta blocker use: yes Coumadin use: yes

## 2015-05-29 NOTE — Progress Notes (Signed)
Occupational Therapy Evaluation Patient Details Name: Hector Green MRN: [id number] DOB: February 22, 1961 Today's Date: 05/29/2015    History of Present Illness Hector Green is a 54 y.o. male who presents for evaluation of rest pain right foot. Patient had right common femoral endarterectomy and femoral to posterior tibial bypass using saphenous vein from left leg on 05/13/2015. Bypass occluded several hours later and return to OR found no technical problems with thrombectomy of bypass. Patient was discharged with good pulse in graft. 4 days ago he developed pain in the right foot and reported that to [place] last evening and now returns for exploration of graft and attempted thrombectomy and/or revision.   Clinical Impression   Patient presents to OT setup/S with ADLs only due to lines. Once the lines are discontinued, he will be modified independent. No OT needs identified at this time. OT will sign off.    Follow Up Recommendations  No OT follow up    Equipment Recommendations  None recommended by OT    Recommendations for Other Services PT consult     Precautions / Restrictions Precautions Precautions: None Restrictions Weight Bearing Restrictions: No      Mobility Bed Mobility Overal bed mobility: Independent                Transfers Overall transfer level: Modified independent Equipment used: Rolling walker (2 wheeled)                  Balance                                            ADL Overall ADL's : Needs assistance/impaired Eating/Feeding: Independent   Grooming: Wash/dry hands;Supervision/safety;Standing   Upper Body Bathing: Set up;Sitting;Standing;Supervision/ safety   Lower Body Bathing: Supervison/ safety;Sit to/from stand   Upper Body Dressing : Set up;Sitting;Standing;Supervision/safety   Lower Body Dressing: Supervision/safety;Sit to/from stand   Toilet Transfer: Supervision/safety;Ambulation;Regular Toilet;RW    Toileting- Clothing Manipulation and Hygiene: Supervision/safety;Sit to/from stand       Functional mobility during ADLs: Supervision/safety;Rolling walker General ADL Comments: Pt requires assist set up assist for ADLs and supervision for funcitonal mobility due to lines only.  Once lines discharged, pt will be mod I      Vision     Perception     Praxis      Pertinent Vitals/Pain Pain Assessment: No/denies pain     Hand Dominance Right   Extremity/Trunk Assessment Upper Extremity Assessment Upper Extremity Assessment: Overall WFL for tasks assessed   Lower Extremity Assessment Lower Extremity Assessment: Defer to PT evaluation   Cervical / Trunk Assessment Cervical / Trunk Assessment: Normal   Communication Communication Communication: No difficulties   Cognition Arousal/Alertness: Awake/alert Behavior During Therapy: WFL for tasks assessed/performed Overall Cognitive Status: Within Functional Limits for tasks assessed                     General Comments       Exercises       Shoulder Instructions      Home Living Family/patient expects to be discharged to:: Private residence Living Arrangements: Non-relatives/Friends Available Help at Discharge: Friend(s) Type of Home: House Home Access: Stairs to enter Secretary/administrator of Steps: 2 Entrance Stairs-Rails: Right Home Layout: One level     Bathroom Shower/Tub: Tub/shower unit;Curtain Shower/tub characteristics: Engineer, building services: Standard Bathroom Accessibility: Yes How  Accessible: Accessible via walker Home Equipment: Walker - 2 wheels;Cane - single point;Grab bars - tub/shower;Shower seat          Prior Functioning/Environment Level of Independence: Independent with assistive device(s)        Comments: since last admission/surgery    OT Diagnosis: Acute pain   OT Problem List: Pain   OT Treatment/Interventions:      OT Goals(Current goals can be found in the  care plan section) Acute Rehab OT Goals Patient Stated Goal: none stated OT Goal Formulation: All assessment and education complete, DC therapy  OT Frequency:     Barriers to D/C:            Co-evaluation              End of Session Equipment Utilized During Treatment: Rolling walker Nurse Communication: Mobility status  Activity Tolerance: Patient tolerated treatment well Patient left: Other (comment) (left patient sitting EOB in care of PT)   Time: 1000-1016 OT Time Calculation (min): 16 min Charges:  OT General Charges $OT Visit: 1 Procedure OT Evaluation $Initial OT Evaluation Tier I: 1 Procedure G-Codes:    Hector Green A 06/07/2015, 10:31 AM

## 2015-05-29 NOTE — Progress Notes (Addendum)
Progress Note    05/29/2015 7:29 AM 2 Days Post-Op  Subjective:  C/o pain in his foot; very sensitive to touch  Tm 99.6 now afebrile HR 70's-80's NSR 120's-140's systolic 99% RA  Filed Vitals:   05/28/15 2153  BP: 144/58  Pulse: 73  Temp: 98.6 F (37 C)  Resp: 18    Physical Exam: Cardiac:  regular Lungs:  Non labored Incisions:   Right groin with nylon sutures in place-no active drainage; Others healing nicely Extremities:  +doppler signal right PT/DP; great toe still with slight mottling, otherwise, foot is pink   CBC    Component Value Date/Time   WBC 10.0 05/28/2015 0340   RBC 4.02* 05/28/2015 0340   HGB 11.7* 05/28/2015 0340   HCT 33.9* 05/28/2015 0340   PLT 140* 05/28/2015 0340   MCV 84.3 05/28/2015 0340   MCH 29.1 05/28/2015 0340   MCHC 34.5 05/28/2015 0340   RDW 12.9 05/28/2015 0340   LYMPHSABS 2.6 04/29/2015 1427   MONOABS 0.7 04/29/2015 1427   EOSABS 0.3 04/29/2015 1427   BASOSABS 0.0 04/29/2015 1427    BMET    Component Value Date/Time   NA 135 05/28/2015 0340   K 4.7 05/28/2015 0340   CL 101 05/28/2015 0340   CO2 26 05/28/2015 0340   GLUCOSE 206* 05/28/2015 0340   BUN 9 05/28/2015 0340   CREATININE 0.89 05/28/2015 0340   CREATININE 0.96 04/29/2015 1427   CALCIUM 8.7* 05/28/2015 0340   GFRNONAA >60 05/28/2015 0340   GFRAA >60 05/28/2015 0340    INR    Component Value Date/Time   INR 1.34 05/29/2015 0331     Intake/Output Summary (Last 24 hours) at 05/29/15 0729 Last data filed at 05/28/15 2154  Gross per 24 hour  Intake 269.97 ml  Output   2085 ml  Net -1815.03 ml   Wound Cultures 05/27/15:  Anaerobic  Gram Stain FEW WBC PRESENT, PREDOMINANTLY PMN  RARE SQUAMOUS EPITHELIAL CELLS PRESENT  FEW GRAM POSITIVE COCCI  IN PAIRS IN CLUSTERS  Performed at [facility]       Culture NO ANAEROBES ISOLATED; CULTURE IN PROGRESS FOR 5 DAYS           Culture Wound 05/27/15:  Aerobic  Gram Stain FEW WBC PRESENT,  PREDOMINANTLY PMN  RARE SQUAMOUS EPITHELIAL CELLS PRESENT  FEW GRAM POSITIVE COCCI  IN PAIRS IN CLUSTERS  Performed at [facility]       Culture MULTIPLE ORGANISMS PRESENT, NONE PREDOMINANT             Assessment:  54 y.o. male is s/p:  THROMBECTOMY OF RIGHT FEMORAL-POSTERIOR TIBIAL ARTERY SAPHENOUS VEIN BYPASS GRAFT with exploration and revision of proximal and distal anastomosis INTRA OPERATIVE ARTERIOGRAM right leg   2 Days Post-Op  Plan: -pt bypass graft is patent with doppler signals in the right DP/PT -wound cx gram stain with few gram positive cocci in pairs and clusters and multiple organisms present on cx.   Pt is on Vancomycin.  -DVT prophylaxis:  Heparin gtt to bridge with coumadin.  Will give coumadin 7.5mg  today -heparin level < 0.1; aPTT is 52-pt on 1200U/hr--will change over to heparin per pharmacy -d/c drains today -mobilize pt out of bed to chair and work with PT -appreciate DM coordinator input.  Lantus 15U qhs added and Novolog 3U tidwic for meal coverage insulin   Doreatha Massed, PA-C [facility] [phone] 05/29/2015 7:29 AM  Agree with above assessment Right femoral to posterior tibial  saphenous vein graft is patent at the present time with 2-3+ graft pulse palpable subcutaneously at the knee level and fairly brisk posterior tibial flow at the ankle.  Drains removed today Incisions healing nicely Patient did have some superficial purulent drainage from right inguinal wound at the time of surgery and he is currently on vancomycin. Cultures reveal some gram-positive cocci and await sensitivities. Patient will need to be discharged on 10 days of oral anti-bionics. Currently patient on Coumadin and heparin When INR is greater than 2 we can discharge patient if ambulating well and we will arrange for follow-up in Coumadin clinic for further control of Coumadin dose Hopefully patient can be discharged by Sunday if Coumadin  regulated and patient ambulating well Begin ambulation today  Patient understands that if he reoccludes this graft in the short-term that no further attempts at revascularization will be made since he has had 3 operations in 2 weeks

## 2015-05-29 NOTE — Progress Notes (Signed)
Called to speak to pt as he is wanting to leave AMA.    He states that he has to get out of this hospital now not for what anyone has done, but for personal reasons.   I have told him that there are no further options for revascularization if his graft clots off.   He understands that he is on IV heparin to thin his blood until the coumadin is therapeutic.    I have asked him if what he needs to tend to is more important than potentially losing his leg and he said yes.  He completely understands that if his graft clots off, he is at very high risk for limb loss.    He also understands that there was potential infection in his right groin with dacron patch material.  He understands that he has potential for getting very sick from this.  I have stated that we have made an appointment for him at [facility] on 06/02/15 at 1515 for INR check and management of uncontrolled diabetes.  He promises me he will make this appointment.  I have explained to him the importance of having his INR checked to make sure his blood is not too thin or thick.  He expresses understanding.  Will discharge him against our recommendation on Coumadin 5mg  daily.  Spoke with Raynelle Fanning in the pharmacy again and she recommends Levaquin as it has the least interaction of possible antibiotics suitable for Mr. Leong.  He will need to f/u with Dr. Hart Rochester in 2 weeks.   Doreatha Massed 05/29/2015 1:09 PM

## 2015-05-29 NOTE — Evaluation (Signed)
Physical Therapy Evaluation and Discharge Patient Details Name: Hector Green MRN: [id number] DOB: 1961-11-24 Today's Date: 05/29/2015   History of Present Illness  Hector Green is a 54 y.o. male who presents for evaluation of rest pain right foot. Patient had right common femoral endarterectomy and femoral to posterior tibial bypass using saphenous vein from left leg on 05/13/2015. Bypass occluded several hours later and return to OR found no technical problems with thrombectomy of bypass. Patient was discharged with good pulse in graft. 4 days ago he developed pain in the right foot and reported that to [place] last evening and now returns for exploration of graft and attempted thrombectomy and/or revision.    Clinical Impression  Patient evaluated by Physical Therapy with no further acute PT needs identified. All education has been completed and the patient has no further questions. PT is signing off. Patient in agreement and RN made aware that pt is to ambulate in hall 3x/day. Thank you for this referral.     Follow Up Recommendations No PT follow up    Equipment Recommendations  None recommended by PT    Recommendations for Other Services       Precautions / Restrictions Precautions Precautions: None Restrictions Weight Bearing Restrictions: No      Mobility  Bed Mobility Overal bed mobility: Independent                Transfers Overall transfer level: Modified independent Equipment used: Rolling walker (2 wheeled)                Ambulation/Gait Ambulation/Gait assistance: Supervision Ambulation Distance (Feet): 210 Feet Assistive device: Rolling walker (2 wheeled) Gait Pattern/deviations: Step-to pattern;Decreased stance time - right;Antalgic   Gait velocity interpretation: Below normal speed for age/gender General Gait Details: vc for sequencing to minimize pain and educated on how to progress to step-through ambulation with incr heelstrike as pain/edema  decreases  Stairs Stairs:  (pt reports no problems on steps after recent d/c)          Wheelchair Mobility    Modified Rankin (Stroke Patients Only)       Balance Overall balance assessment: Modified Independent (with RW; also able to stand without UE support)                                           Pertinent Vitals/Pain Pain Assessment: Faces Faces Pain Scale: Hurts even more (verbally denies pain, however notes cramping) Pain Location: Rt calf Pain Descriptors / Indicators: Cramping Pain Intervention(s): Limited activity within patient's tolerance;Monitored during session;Repositioned (instructed in sequencing with RW to decr RLE pain)    Home Living Family/patient expects to be discharged to:: Private residence Living Arrangements: Non-relatives/Friends Available Help at Discharge: Friend(s) Type of Home: House Home Access: Stairs to enter Entrance Stairs-Rails: Right Entrance Stairs-Number of Steps: 2 Home Layout: One level Home Equipment: Environmental consultant - 2 wheels;Cane - single point;Grab bars - tub/shower;Shower seat      Prior Function Level of Independence: Independent with assistive device(s) (RW vs cane)         Comments: since last admission/surgery     Hand Dominance   Dominant Hand: Right    Extremity/Trunk Assessment   Upper Extremity Assessment: Defer to OT evaluation           Lower Extremity Assessment: RLE deficits/detail RLE Deficits / Details: limied by pain; ankle DF  to neutral with 40* PF; hip and knee WFL    Cervical / Trunk Assessment: Normal  Communication   Communication: No difficulties  Cognition Arousal/Alertness: Awake/alert Behavior During Therapy: WFL for tasks assessed/performed Overall Cognitive Status: Within Functional Limits for tasks assessed                      General Comments General comments (skin integrity, edema, etc.): Educated on importance of elevation when at rest (and ankle  pumps) to decr edema and pain. Educated to avoid static standing or sitting with foot on floor. Pt repositioned in bed at end of session to demonstrate proper height of leg    Exercises General Exercises - Lower Extremity Ankle Circles/Pumps: AROM;Right;10 reps;Supine Heel Slides: AROM;Right;5 reps;Supine      Assessment/Plan    PT Assessment Patent does not need any further PT services  PT Diagnosis Difficulty walking;Acute pain   PT Problem List    PT Treatment Interventions     PT Goals (Current goals can be found in the Care Plan section) Acute Rehab PT Goals Patient Stated Goal: to be able to progress to cane soon PT Goal Formulation: All assessment and education complete, DC therapy    Frequency     Barriers to discharge        Co-evaluation               End of Session Equipment Utilized During Treatment: Gait belt Activity Tolerance: Patient tolerated treatment well Patient left: in bed;with call bell/phone within reach;with nursing/sitter in room Nurse Communication: Mobility status         Time: 1014-1030 PT Time Calculation (min) (ACUTE ONLY): 16 min   Charges:   PT Evaluation $Initial PT Evaluation Tier I: 1 Procedure     PT G Codes:        Mariann Palo 2015-06-26, 10:46 AM Pager [phone]

## 2015-05-29 NOTE — Hospital Discharge Follow-Up (Signed)
Received communication from Marvetta Gibbons, RN CM that patient needing PCP and close post-discharge follow-up. Met with patient and spoke with him about [facility] at [facility] and [facility]  Patient indicates he lives in [place], [place] and the [facility] would be too far. He indicates he prefers to go to a clinic in [place]. Updated Marvetta Gibbons, RN CM.

## 2015-05-29 NOTE — Telephone Encounter (Signed)
Appt made for this patient at [facility] on 06-02-15 at 3:15pm for follow up on Diabetes and Coumadin therapy.  Their address is [address] in [place] [address]  Phone# is [phone].

## 2015-05-29 NOTE — Progress Notes (Signed)
Pt expressing exasperation with hospitalization and asked RN "What do i need to do to check myself out of here?"  I explained the AMA process and consequences, as well as the extreme medical risk given his clinical presentation, and he reluctantly agreed to allow current dose of IV Vanc to finish running.  Still insists that he "needs to get out of here."  PA notified and plans to come see Pt.  CN aware.

## 2015-05-30 LAB — WOUND CULTURE

## 2015-06-01 LAB — ANAEROBIC CULTURE

## 2015-06-02 ENCOUNTER — Encounter: Payer: Medicare Other | Admitting: Vascular Surgery

## 2015-06-02 ENCOUNTER — Ambulatory Visit (INDEPENDENT_AMBULATORY_CARE_PROVIDER_SITE_OTHER): Payer: Medicare Other | Admitting: Physician Assistant

## 2015-06-02 ENCOUNTER — Inpatient Hospital Stay: Payer: Medicare Other | Admitting: Family Medicine

## 2015-06-02 ENCOUNTER — Encounter: Payer: Self-pay | Admitting: Physician Assistant

## 2015-06-02 VITALS — BP 121/73 | HR 102 | Temp 98.0°F | Ht 69.5 in | Wt 181.0 lb

## 2015-06-02 DIAGNOSIS — E1159 Type 2 diabetes mellitus with other circulatory complications: Secondary | ICD-10-CM | POA: Diagnosis not present

## 2015-06-02 DIAGNOSIS — L03115 Cellulitis of right lower limb: Secondary | ICD-10-CM | POA: Diagnosis not present

## 2015-06-02 DIAGNOSIS — I70229 Atherosclerosis of native arteries of extremities with rest pain, unspecified extremity: Secondary | ICD-10-CM | POA: Diagnosis not present

## 2015-06-02 DIAGNOSIS — Z09 Encounter for follow-up examination after completed treatment for conditions other than malignant neoplasm: Secondary | ICD-10-CM

## 2015-06-02 NOTE — Progress Notes (Addendum)
Subjective:    Patient ID: Hector Green, male    DOB: 09-02-1961, 54 y.o.   MRN: [id number]  HPI 54 y/o male with recent hospitalization ( discharged 05/27/15) for artery bypass graft by Dr. Bland Span in addition to numerous recently diagnosed comorbidities presents for hospital follow up. He recently moved from [place] and needs to establish care.Prior to hospitalization, he was diagnosed with DM type 2, taking Metformin.  He presents today for hospital follow up post graft and establishment of care for his CAD, DM type 2, Htn, hyperlipidemia.   He states that he continue to have mild pain in his right foot but is improving "walking with a cane instead of a walker".   He states that he is checking his FBS but cannot give a range of readings because he "goes across the road to his neighbors and does not read the machine after he checks it"     Review of Systems  Constitutional: Negative.   HENT: Negative.   Respiratory: Negative.  Negative for cough, shortness of breath and wheezing.   Cardiovascular: Positive for leg swelling (right leg). Negative for chest pain and palpitations.       "leaking" in right leg   Gastrointestinal: Negative.        Objective:   Physical Exam  Constitutional: He is oriented to person, place, and time. He appears well-developed and well-nourished. No distress.  Cardiovascular: Normal rate, regular rhythm and normal heart sounds.  Exam reveals no gallop and no friction rub.   No murmur heard. Pulmonary/Chest: Effort normal and breath sounds normal. He has no wheezes.  Musculoskeletal: He exhibits edema and tenderness.  Edematous RLE , more prominent toward distal extremity with increasing erythema and cellulitic appearance. Erythema more pronounced on right lateral foot. Foot is very tender to light palpation. Clear fluid oozing from areas of RLE (calf and shin) during exam.   Neurological: He is alert and oriented to person, place, and time.  Skin:  Skin is warm. He is not diaphoretic. There is erythema.  Cellulitis in right foot, more prominent on lateral plantar and dorsal sides. Purple discoloration in 5th digit, right foot indicating possible necrosis with lack of blood supply. Nonpalpable dorsal pedis and posterior tibial pulses in RLE, possibly d/t excessive edema as a contributing factor  Ulcer with expression of purulent substance between 4th and 5th digits of RLE.   5 point discrimination of right foot 3/5, left foot 4/5  Otherwise, incision site of graft on RLE appears to be healing and intact  Nursing note and vitals reviewed.         Assessment & Plan:  1. Hospital discharge follow-up   2. Type 2 diabetes mellitus with other circulatory complications - I do not feel that patient has been taking his medication and checking FBS as needed, based on his history. Likely, glucose levels are not WNL but were not checked at visit today due to patient leaving AMA.    3. Cellulitis of right lower extremity  After consultation with Dr. Rudi Heap, who examined the patient also, the patient was advised to go immediately to the hospital for further evaluation and IV antibiotic treatment for his cellulitis of foot and vascular complications. He refused to go to hospital and follow up with Dr. Bland Span as we suggested. He was accompanied by his brother, who was in the lobby. We offered to talk to family member to explain the need for hospitalization but patient refused further  treatment   Eliakim Tendler A. Shakelia Scrivner PA-C    Nyra Capes MD

## 2015-06-03 ENCOUNTER — Encounter: Payer: Self-pay | Admitting: Physician Assistant

## 2015-06-04 ENCOUNTER — Telehealth: Payer: Self-pay | Admitting: *Deleted

## 2015-06-04 NOTE — Telephone Encounter (Signed)
Patient called to triage demanding other pain medications instead of the oxycodone IR 5 mg #30 that was given to him on 05-29-15 when he signed out AMA. He had undergone thrombectomy /revision of his right Femoral-posterior tibial BPG on 05-27-15 by Dr. Hart Rochester ( for occluded BPG).  He also had a infected groin / right leg wound and was receiving Vancomycin. On 05-29-15, the patient signed out even though Dr. Hart Rochester, Lelon Mast Rhyne,PA, numerous nurses and case workers repeatedly warned him of complications. He did go to the appt that we set up with [facility] on 06-02-15. I spoke to Lynwood Dawley PA because her note in EPIC was incomplete. She stated that when the patient was seen, his right foot / leg showed severe cellulitis and also his right Great toe was dark /dusky. He had no palpable pulses in his right foot. He had told them that he was taking his antibiotics and coumadin regularly.They told him to go directly to [facility] for evaluation of his leg/ foot but Hector Green refused and left their office mad and upset.   Today, Hector Green called our office wanting a stronger pain medication. He stated that " we had discharged him from the hospital too early last week and didn't care that he was in pain".  Patient describes his right foot as cold and very painful. He has drainage coming out of his wounds. I strongly discussed the need for him to go immediately to the [facility] ED for evaluation of his bypass graft and wound infections. He said " he didn't think it was necessary but he would consider it".  I again stressed that he could possibly lose his leg or life, if he wasn't seen and treated in the Hospital. He hung up on me.

## 2015-06-08 ENCOUNTER — Telehealth: Payer: Self-pay

## 2015-06-08 DIAGNOSIS — I70229 Atherosclerosis of native arteries of extremities with rest pain, unspecified extremity: Secondary | ICD-10-CM

## 2015-06-08 DIAGNOSIS — Z95828 Presence of other vascular implants and grafts: Secondary | ICD-10-CM

## 2015-06-08 DIAGNOSIS — M79604 Pain in right leg: Secondary | ICD-10-CM

## 2015-06-08 DIAGNOSIS — Z9889 Other specified postprocedural states: Secondary | ICD-10-CM

## 2015-06-08 NOTE — Telephone Encounter (Signed)
Phone call from pt.  Requested an antibiotic, and more pain medication for his right foot.  C/o "throbbing and stinging pain" in the last 3 toes, of the right foot.  Reported that "the pain is located from the bottom of my calf to the toes."  Stated the scabs on the last 3 toes, have partially fallen off.  Stated he has an infection in the toes of the right foot.  Reported he has been soaking his foot, applying hot compresses, and taking Ibuprofen, to help ease the pain.   Stated he only has a few antibiotics left, and needs more, in addition to more pain medication.   Stated he was told by another nurse, last week, he would have to go to the hospital for further treatment.  Denied fever / chills.  Denied any drainage from the toes of the right foot.  Stated "the color of the right foot looks about like my left foot."  Discussed with Dr. Hart Rochester.  Advised to schedule for right LE bypass graft duplex and ABI's, and to see the nurse practitioner 6/28, to evaluate if the BP graft is occluded,

## 2015-06-09 ENCOUNTER — Ambulatory Visit: Payer: Medicare Other | Admitting: Family

## 2015-06-09 ENCOUNTER — Other Ambulatory Visit (HOSPITAL_COMMUNITY): Payer: Medicare Other

## 2015-06-09 ENCOUNTER — Encounter (HOSPITAL_COMMUNITY): Payer: Medicare Other

## 2015-06-09 DIAGNOSIS — E119 Type 2 diabetes mellitus without complications: Secondary | ICD-10-CM | POA: Diagnosis not present

## 2015-06-09 DIAGNOSIS — Z951 Presence of aortocoronary bypass graft: Secondary | ICD-10-CM | POA: Diagnosis not present

## 2015-06-09 DIAGNOSIS — M79604 Pain in right leg: Secondary | ICD-10-CM | POA: Diagnosis not present

## 2015-06-09 DIAGNOSIS — Z720 Tobacco use: Secondary | ICD-10-CM | POA: Diagnosis not present

## 2015-06-09 DIAGNOSIS — Z880 Allergy status to penicillin: Secondary | ICD-10-CM | POA: Diagnosis not present

## 2015-06-09 DIAGNOSIS — I10 Essential (primary) hypertension: Secondary | ICD-10-CM | POA: Diagnosis not present

## 2015-06-09 DIAGNOSIS — I252 Old myocardial infarction: Secondary | ICD-10-CM | POA: Diagnosis not present

## 2015-06-09 DIAGNOSIS — I2510 Atherosclerotic heart disease of native coronary artery without angina pectoris: Secondary | ICD-10-CM | POA: Diagnosis not present

## 2015-06-09 DIAGNOSIS — E780 Pure hypercholesterolemia: Secondary | ICD-10-CM | POA: Diagnosis not present

## 2015-06-09 DIAGNOSIS — Z79899 Other long term (current) drug therapy: Secondary | ICD-10-CM | POA: Diagnosis not present

## 2015-06-10 ENCOUNTER — Encounter: Payer: Self-pay | Admitting: Vascular Surgery

## 2015-06-10 ENCOUNTER — Ambulatory Visit (HOSPITAL_COMMUNITY)
Admission: RE | Admit: 2015-06-10 | Discharge: 2015-06-10 | Disposition: A | Discharge status: Home / Self Care | Payer: Medicare Other | Source: Ambulatory Visit | Attending: Vascular Surgery | Admitting: Vascular Surgery

## 2015-06-10 ENCOUNTER — Encounter: Payer: Self-pay | Admitting: Family

## 2015-06-10 ENCOUNTER — Ambulatory Visit (INDEPENDENT_AMBULATORY_CARE_PROVIDER_SITE_OTHER)
Admission: RE | Admit: 2015-06-10 | Discharge: 2015-06-10 | Disposition: A | Discharge status: Home / Self Care | Payer: Medicare Other | Source: Ambulatory Visit | Attending: Vascular Surgery | Admitting: Vascular Surgery

## 2015-06-10 ENCOUNTER — Ambulatory Visit (INDEPENDENT_AMBULATORY_CARE_PROVIDER_SITE_OTHER): Payer: Self-pay | Admitting: Family

## 2015-06-10 VITALS — BP 132/74 | HR 77 | Temp 97.3°F | Resp 16 | Ht 70.0 in | Wt 181.0 lb

## 2015-06-10 DIAGNOSIS — F17200 Nicotine dependence, unspecified, uncomplicated: Secondary | ICD-10-CM

## 2015-06-10 DIAGNOSIS — M79671 Pain in right foot: Secondary | ICD-10-CM

## 2015-06-10 DIAGNOSIS — I10 Essential (primary) hypertension: Secondary | ICD-10-CM | POA: Diagnosis not present

## 2015-06-10 DIAGNOSIS — Z9889 Other specified postprocedural states: Secondary | ICD-10-CM | POA: Diagnosis not present

## 2015-06-10 DIAGNOSIS — G8918 Other acute postprocedural pain: Secondary | ICD-10-CM

## 2015-06-10 DIAGNOSIS — R202 Paresthesia of skin: Secondary | ICD-10-CM

## 2015-06-10 DIAGNOSIS — E785 Hyperlipidemia, unspecified: Secondary | ICD-10-CM | POA: Insufficient documentation

## 2015-06-10 DIAGNOSIS — E1151 Type 2 diabetes mellitus with diabetic peripheral angiopathy without gangrene: Secondary | ICD-10-CM

## 2015-06-10 DIAGNOSIS — I739 Peripheral vascular disease, unspecified: Secondary | ICD-10-CM

## 2015-06-10 DIAGNOSIS — E1165 Type 2 diabetes mellitus with hyperglycemia: Secondary | ICD-10-CM

## 2015-06-10 DIAGNOSIS — M79604 Pain in right leg: Secondary | ICD-10-CM | POA: Diagnosis not present

## 2015-06-10 DIAGNOSIS — E1159 Type 2 diabetes mellitus with other circulatory complications: Secondary | ICD-10-CM

## 2015-06-10 DIAGNOSIS — E119 Type 2 diabetes mellitus without complications: Secondary | ICD-10-CM | POA: Diagnosis not present

## 2015-06-10 DIAGNOSIS — I70229 Atherosclerosis of native arteries of extremities with rest pain, unspecified extremity: Secondary | ICD-10-CM

## 2015-06-10 DIAGNOSIS — I70221 Atherosclerosis of native arteries of extremities with rest pain, right leg: Secondary | ICD-10-CM

## 2015-06-10 DIAGNOSIS — IMO0002 Reserved for concepts with insufficient information to code with codable children: Secondary | ICD-10-CM

## 2015-06-10 DIAGNOSIS — I998 Other disorder of circulatory system: Secondary | ICD-10-CM

## 2015-06-10 DIAGNOSIS — Z95828 Presence of other vascular implants and grafts: Secondary | ICD-10-CM

## 2015-06-10 DIAGNOSIS — Z720 Tobacco use: Secondary | ICD-10-CM

## 2015-06-10 DIAGNOSIS — I999 Unspecified disorder of circulatory system: Secondary | ICD-10-CM

## 2015-06-10 DIAGNOSIS — R200 Anesthesia of skin: Secondary | ICD-10-CM | POA: Insufficient documentation

## 2015-06-10 MED ORDER — OXYCODONE HCL 5 MG PO TABS: ORAL_TABLET | ORAL | Status: DC

## 2015-06-10 NOTE — Patient Instructions (Signed)
Peripheral Vascular Disease Peripheral Vascular Disease (PVD), also called Peripheral Arterial Disease (PAD), is a circulation problem caused by cholesterol (atherosclerotic plaque) deposits in the arteries. PVD commonly occurs in the lower extremities (legs) but it can occur in other areas of the body, such as your arms. The cholesterol buildup in the arteries reduces blood flow which can cause pain and other serious problems. The presence of PVD can place a person at risk for Coronary Artery Disease (CAD).  CAUSES  Causes of PVD can be many. It is usually associated with more than one risk factor such as:   High Cholesterol.  Smoking.  Diabetes.  Lack of exercise or inactivity.  High blood pressure (hypertension).  Obesity.  Family history. SYMPTOMS   When the lower extremities are affected, patients with PVD may experience:  Leg pain with exertion or physical activity. This is called INTERMITTENT CLAUDICATION. This may present as cramping or numbness with physical activity. The location of the pain is associated with the level of blockage. For example, blockage at the abdominal level (distal abdominal aorta) may result in buttock or hip pain. Lower leg arterial blockage may result in calf pain.  As PVD becomes more severe, pain can develop with less physical activity.  In people with severe PVD, leg pain may occur at rest.  Other PVD signs and symptoms:  Leg numbness or weakness.  Coldness in the affected leg or foot, especially when compared to the other leg.  A change in leg color.  Patients with significant PVD are more prone to ulcers or sores on toes, feet or legs. These may take longer to heal or may reoccur. The ulcers or sores can become infected.  If signs and symptoms of PVD are ignored, gangrene may occur. This can result in the loss of toes or loss of an entire limb.  Not all leg pain is related to PVD. Other medical conditions can cause leg pain such  as:  Blood clots (embolism) or Deep Vein Thrombosis.  Inflammation of the blood vessels (vasculitis).  Spinal stenosis. DIAGNOSIS  Diagnosis of PVD can involve several different types of tests. These can include:  Pulse Volume Recording Method (PVR). This test is simple, painless and does not involve the use of X-rays. PVR involves measuring and comparing the blood pressure in the arms and legs. An ABI (Ankle-Brachial Index) is calculated. The normal ratio of blood pressures is 1. As this number becomes smaller, it indicates more severe disease.  < 0.95 - indicates significant narrowing in one or more leg vessels.  <0.8 - there will usually be pain in the foot, leg or buttock with exercise.  <0.4 - will usually have pain in the legs at rest.  <0.25 - usually indicates limb threatening PVD.  Doppler detection of pulses in the legs. This test is painless and checks to see if you have a pulses in your legs/feet.  A dye or contrast material (a substance that highlights the blood vessels so they show up on x-ray) may be given to help your caregiver better see the arteries for the following tests. The dye is eliminated from your body by the kidney's. Your caregiver may order blood work to check your kidney function and other laboratory values before the following tests are performed:  Magnetic Resonance Angiography (MRA). An MRA is a picture study of the blood vessels and arteries. The MRA machine uses a large magnet to produce images of the blood vessels.  Computed Tomography Angiography (CTA). A CTA   is a specialized x-ray that looks at how the blood flows in your blood vessels. An IV may be inserted into your arm so contrast dye can be injected.  Angiogram. Is a procedure that uses x-rays to look at your blood vessels. This procedure is minimally invasive, meaning a small incision (cut) is made in your groin. A small tube (catheter) is then inserted into the artery of your groin. The catheter  is guided to the blood vessel or artery your caregiver wants to examine. Contrast dye is injected into the catheter. X-rays are then taken of the blood vessel or artery. After the images are obtained, the catheter is taken out. TREATMENT  Treatment of PVD involves many interventions which may include:  Lifestyle changes:  Quitting smoking.  Exercise.  Following a low fat, low cholesterol diet.  Control of diabetes.  Foot care is very important to the PVD patient. Good foot care can help prevent infection.  Medication:  Cholesterol-lowering medicine.  Blood pressure medicine.  Anti-platelet drugs.  Certain medicines may reduce symptoms of Intermittent Claudication.  Interventional/Surgical options:  Angioplasty. An Angioplasty is a procedure that inflates a balloon in the blocked artery. This opens the blocked artery to improve blood flow.  Stent Implant. A wire mesh tube (stent) is placed in the artery. The stent expands and stays in place, allowing the artery to remain open.  Peripheral Bypass Surgery. This is a surgical procedure that reroutes the blood around a blocked artery to help improve blood flow. This type of procedure may be performed if Angioplasty or stent implants are not an option. SEEK IMMEDIATE MEDICAL CARE IF:   You develop pain or numbness in your arms or legs.  Your arm or leg turns cold, becomes blue in color.  You develop redness, warmth, swelling and pain in your arms or legs. MAKE SURE YOU:   Understand these instructions.  Will watch your condition.  Will get help right away if you are not doing well or get worse. Document Released: 01/05/2005 Document Revised: 02/20/2012 Document Reviewed: 12/02/2008 [employer] Patient Information 2015 [employer]. This information is not intended to replace advice given to you by your health care provider. Make sure you discuss any questions you have with your health care provider.    Smoking Cessation,  Tips for Success If you are ready to quit smoking, congratulations! You have chosen to help yourself be healthier. Cigarettes bring nicotine, tar, carbon monoxide, and other irritants into your body. Your lungs, heart, and blood vessels will be able to work better without these poisons. There are many different ways to quit smoking. Nicotine gum, nicotine patches, a nicotine inhaler, or nicotine nasal spray can help with physical craving. Hypnosis, support groups, and medicines help break the habit of smoking. WHAT THINGS CAN I DO TO MAKE QUITTING EASIER?  Here are some tips to help you quit for good:  Pick a date when you will quit smoking completely. Tell all of your friends and family about your plan to quit on that date.  Do not try to slowly cut down on the number of cigarettes you are smoking. Pick a quit date and quit smoking completely starting on that day.  Throw away all cigarettes.   Clean and remove all ashtrays from your home, work, and car.  On a card, write down your reasons for quitting. Carry the card with you and read it when you get the urge to smoke.  Cleanse your body of nicotine. Drink enough water and   fluids to keep your urine clear or pale yellow. Do this after quitting to flush the nicotine from your body.  Learn to predict your moods. Do not let a bad situation be your excuse to have a cigarette. Some situations in your life might tempt you into wanting a cigarette.  Never have "just one" cigarette. It leads to wanting another and another. Remind yourself of your decision to quit.  Change habits associated with smoking. If you smoked while driving or when feeling stressed, try other activities to replace smoking. Stand up when drinking your coffee. Brush your teeth after eating. Sit in a different chair when you read the paper. Avoid alcohol while trying to quit, and try to drink fewer caffeinated beverages. Alcohol and caffeine may urge you to smoke.  Avoid foods  and drinks that can trigger a desire to smoke, such as sugary or spicy foods and alcohol.  Ask people who smoke not to smoke around you.  Have something planned to do right after eating or having a cup of coffee. For example, plan to take a walk or exercise.  Try a relaxation exercise to calm you down and decrease your stress. Remember, you may be tense and nervous for the first 2 weeks after you quit, but this will pass.  Find new activities to keep your hands busy. Play with a pen, coin, or rubber band. Doodle or draw things on paper.  Brush your teeth right after eating. This will help cut down on the craving for the taste of tobacco after meals. You can also try mouthwash.   Use oral substitutes in place of cigarettes. Try using lemon drops, carrots, cinnamon sticks, or chewing gum. Keep them handy so they are available when you have the urge to smoke.  When you have the urge to smoke, try deep breathing.  Designate your home as a nonsmoking area.  If you are a heavy smoker, ask your health care provider about a prescription for nicotine chewing gum. It can ease your withdrawal from nicotine.  Reward yourself. Set aside the cigarette money you save and buy yourself something nice.  Look for support from others. Join a support group or smoking cessation program. Ask someone at home or at work to help you with your plan to quit smoking.  Always ask yourself, "Do I need this cigarette or is this just a reflex?" Tell yourself, "Today, I choose not to smoke," or "I do not want to smoke." You are reminding yourself of your decision to quit.  Do not replace cigarette smoking with electronic cigarettes (commonly called e-cigarettes). The safety of e-cigarettes is unknown, and some may contain harmful chemicals.  If you relapse, do not give up! Plan ahead and think about what you will do the next time you get the urge to smoke. HOW WILL I FEEL WHEN I QUIT SMOKING? You may have symptoms of  withdrawal because your body is used to nicotine (the addictive substance in cigarettes). You may crave cigarettes, be irritable, feel very hungry, cough often, get headaches, or have difficulty concentrating. The withdrawal symptoms are only temporary. They are strongest when you first quit but will go away within 10-14 days. When withdrawal symptoms occur, stay in control. Think about your reasons for quitting. Remind yourself that these are signs that your body is healing and getting used to being without cigarettes. Remember that withdrawal symptoms are easier to treat than the major diseases that smoking can cause.  Even after the withdrawal is   over, expect periodic urges to smoke. However, these cravings are generally short lived and will go away whether you smoke or not. Do not smoke! WHAT RESOURCES ARE AVAILABLE TO HELP ME QUIT SMOKING? Your health care provider can direct you to community resources or hospitals for support, which may include:  Group support.  Education.  Hypnosis.  Therapy. Document Released: 08/26/2004 Document Revised: 04/14/2014 Document Reviewed: 05/16/2013 [employer] Patient Information 2015 [employer]. This information is not intended to replace advice given to you by your health care provider. Make sure you discuss any questions you have with your health care provider.    Smoking Cessation Quitting smoking is important to your health and has many advantages. However, it is not always easy to quit since nicotine is a very addictive drug. Oftentimes, people try 3 times or more before being able to quit. This document explains the best ways for you to prepare to quit smoking. Quitting takes hard work and a lot of effort, but you can do it. ADVANTAGES OF QUITTING SMOKING  You will live longer, feel better, and live better.  Your body will feel the impact of quitting smoking almost immediately.  Within 20 minutes, blood pressure decreases. Your pulse returns to  its normal level.  After 8 hours, carbon monoxide levels in the blood return to normal. Your oxygen level increases.  After 24 hours, the chance of having a heart attack starts to decrease. Your breath, hair, and body stop smelling like smoke.  After 48 hours, damaged nerve endings begin to recover. Your sense of taste and smell improve.  After 72 hours, the body is virtually free of nicotine. Your bronchial tubes relax and breathing becomes easier.  After 2 to 12 weeks, lungs can hold more air. Exercise becomes easier and circulation improves.  The risk of having a heart attack, stroke, cancer, or lung disease is greatly reduced.  After 1 year, the risk of coronary heart disease is cut in half.  After 5 years, the risk of stroke falls to the same as a nonsmoker.  After 10 years, the risk of lung cancer is cut in half and the risk of other cancers decreases significantly.  After 15 years, the risk of coronary heart disease drops, usually to the level of a nonsmoker.  If you are pregnant, quitting smoking will improve your chances of having a healthy baby.  The people you live with, especially any children, will be healthier.  You will have extra money to spend on things other than cigarettes. QUESTIONS TO THINK ABOUT BEFORE ATTEMPTING TO QUIT You may want to talk about your answers with your health care provider.  Why do you want to quit?  If you tried to quit in the past, what helped and what did not?  What will be the most difficult situations for you after you quit? How will you plan to handle them?  Who can help you through the tough times? Your family? Friends? A health care provider?  What pleasures do you get from smoking? What ways can you still get pleasure if you quit? Here are some questions to ask your health care provider:  How can you help me to be successful at quitting?  What medicine do you think would be best for me and how should I take it?  What should  I do if I need more help?  What is smoking withdrawal like? How can I get information on withdrawal? GET READY  Set a quit date.    Change your environment by getting rid of all cigarettes, ashtrays, matches, and lighters in your home, car, or work. Do not let people smoke in your home.  Review your past attempts to quit. Think about what worked and what did not. GET SUPPORT AND ENCOURAGEMENT You have a better chance of being successful if you have help. You can get support in many ways.  Tell your family, friends, and coworkers that you are going to quit and need their support. Ask them not to smoke around you.  Get individual, group, or telephone counseling and support. Programs are available at local hospitals and health centers. Call your local health department for information about programs in your area.  Spiritual beliefs and practices may help some smokers quit.  Download a "quit meter" on your computer to keep track of quit statistics, such as how long you have gone without smoking, cigarettes not smoked, and money saved.  Get a self-help book about quitting smoking and staying off tobacco. LEARN NEW SKILLS AND BEHAVIORS  Distract yourself from urges to smoke. Talk to someone, go for a walk, or occupy your time with a task.  Change your normal routine. Take a different route to work. Drink tea instead of coffee. Eat breakfast in a different place.  Reduce your stress. Take a hot bath, exercise, or read a book.  Plan something enjoyable to do every day. Reward yourself for not smoking.  Explore interactive web-based programs that specialize in helping you quit. GET MEDICINE AND USE IT CORRECTLY Medicines can help you stop smoking and decrease the urge to smoke. Combining medicine with the above behavioral methods and support can greatly increase your chances of successfully quitting smoking.  Nicotine replacement therapy helps deliver nicotine to your body without the  negative effects and risks of smoking. Nicotine replacement therapy includes nicotine gum, lozenges, inhalers, nasal sprays, and skin patches. Some may be available over-the-counter and others require a prescription.  Antidepressant medicine helps people abstain from smoking, but how this works is unknown. This medicine is available by prescription.  Nicotinic receptor partial agonist medicine simulates the effect of nicotine in your brain. This medicine is available by prescription. Ask your health care provider for advice about which medicines to use and how to use them based on your health history. Your health care provider will tell you what side effects to look out for if you choose to be on a medicine or therapy. Carefully read the information on the package. Do not use any other product containing nicotine while using a nicotine replacement product.  RELAPSE OR DIFFICULT SITUATIONS Most relapses occur within the first 3 months after quitting. Do not be discouraged if you start smoking again. Remember, most people try several times before finally quitting. You may have symptoms of withdrawal because your body is used to nicotine. You may crave cigarettes, be irritable, feel very hungry, cough often, get headaches, or have difficulty concentrating. The withdrawal symptoms are only temporary. They are strongest when you first quit, but they will go away within 10-14 days. To reduce the chances of relapse, try to:  Avoid drinking alcohol. Drinking lowers your chances of successfully quitting.  Reduce the amount of caffeine you consume. Once you quit smoking, the amount of caffeine in your body increases and can give you symptoms, such as a rapid heartbeat, sweating, and anxiety.  Avoid smokers because they can make you want to smoke.  Do not let weight gain distract you. Many smokers will gain weight   when they quit, usually less than 10 pounds. Eat a healthy diet and stay active. You can always  lose the weight gained after you quit.  Find ways to improve your mood other than smoking. FOR MORE INFORMATION  www.smokefree.gov  Document Released: 11/22/2001 Document Revised: 04/14/2014 Document Reviewed: 03/08/2012 [employer] Patient Information 2015 [employer]. This information is not intended to replace advice given to you by your health care provider. Make sure you discuss any questions you have with your health care provider.  

## 2015-06-10 NOTE — Progress Notes (Signed)
[facility] [place] HISTORY AND PHYSICAL -PAD  History of Present Illness Hector Green is a 54 y.o. male patient of Dr. Imogene Burn with uncontrolled diabetes who presents with chief complaint: severe right foot pain. He is s/p right femoral to posterior  tibial bypass graft 04/24/2015 by Dr. Hart Rochester. 05/27/2015 exploration and revision of the distal anastomosis.   This patient was previously evaluated for >2 months of anesthesia in R lower leg and severe RLE rest pain. His angiogram demonstrated extensive disease with anatomy amendable to a R Fem-PT bypass. Dr. Imogene Burn admitted the patient post-angiogram for medical work-up and optimization. Pt left AMA and was lost to follow despite attempts by [facility] staff to track him down. When he reappeared, he was sent for cardiac evaluation. His cardiac evaluation demonstrated 30-44% EF and evidence of ischemia. He recently underwent cardiac catheterization which demonstrated patent CABG bypass grafts. At that point, the patient was ready to proceed with his bypass. To date, he has NOT stopped smoking and has NOT started the insulin therapy previously recommended.  Pt states he returns for pain medication prescription refill; pain in right lower leg and foot for 3 months, keeps him awake at night. He states that he ran out of his prescribed medication for pain four days ago (oxycodone 5 mg, #30 tabs dispensed, prescribed on 05/29/15); was taking 2 tablets every 6 hours which was helping with his pain. He denies fever or chills; review of records shows that Levaquin 750 mg, 1 tablet daily for 10 days, was prescribed on 05/29/15; he states he is fairly sure that he took this.  Pt denies that he saw a primary care provider as referred by [facility] on hospital discharge, but review of records notes that he was seen by [place] [facility] Medicine practice who advised pt to go immediately to the hospital for further evaluation and IV antibiotic treatment  for his cellulitis of foot and vascular complications, but pt apparently refused.  Pt admits to not having a glucometer, states he uses a friend's glucometer to check his blood sugar; when asked when he last checked his blood sugar he stated this was two days ago but does not remember what his blood sugar result was.   Pt Diabetic: Yes, takes no other DM medications other than metformin; states he was not told to take insulin Pt smoker: smoker  (admits to 5 cigarettes/day) Pt denies using ETOH, heroin, crack or other cocaine, or any street drugs.  Pt meds include: Statin :supposed to be taking Betablocker: supposed to be taking ASA: supposed to be taking Other anticoagulants/antiplatelets: supposed to be taking coumadin  Past Medical History  Diagnosis Date  . CAD (coronary artery disease) 2011    Multivessel s/p CABG in [place]  . Essential hypertension   . Type 2 diabetes mellitus   . Hyperlipidemia   . Peripheral arterial disease   . Abnormal nuclear stress test 05/06/2015  . Myocardial infarction 2011    Social History History  Substance Use Topics  . Smoking status: Light Tobacco Smoker -- 0.50 packs/day for 15 years    Types: Cigarettes    Start date: 08/24/1975  . Smokeless tobacco: Never Used  . Alcohol Use: No    Family History Family History  Problem Relation Age of Onset  . Cancer Father     Past Surgical History  Procedure Laterality Date  . Coronary artery bypass graft  2011    Three vessel by report  . Abdominal aortagram  N/A 03/30/2015    Procedure: ABDOMINAL [name];  Surgeon: Fransisco Hertz, MD;  Location: [name] CATH LAB;  Service: Cardiovascular;  Laterality: N/A;  . Lower extremity angiogram N/A 03/30/2015    Procedure: LOWER EXTREMITY ANGIOGRAM;  Surgeon: Fransisco Hertz, MD;  Location: [facility] CATH LAB;  Service: Cardiovascular;  Laterality: N/A;  . Cardiac catheterization N/A 05/06/2015    Procedure: Left Heart Cath and Cors/Grafts Angiography;   Surgeon: Tonny Bollman, MD;  Location: [facility] INVASIVE CV LAB;  Service: Cardiovascular;  Laterality: N/A;  . Femoral-tibial bypass graft Right 05/13/2015    Procedure: BYPASS GRAFT RIGHT FEMORAL-POSTERIOR TIBIAL ARTERY USING LEFT NONREVERSED TRANSLOCATED GREATER SAPPHENOUS VEIN;  Surgeon: Pryor Ochoa, MD;  Location: [facility] OR;  Service: Vascular;  Laterality: Right;  . Vein harvest Left 05/13/2015    Procedure: LEFT GREATER SAPPHENOUS VEIN HARVEST;  Surgeon: Pryor Ochoa, MD;  Location: [facility] OR;  Service: Vascular;  Laterality: Left;  . Endarterectomy femoral Right 05/13/2015    Procedure: RIGHT COMMON FEMORAL, SUPERFICIAL FEMORAL AND PROFUNDA ENDARTERECTOMY ;  Surgeon: Pryor Ochoa, MD;  Location: [facility] OR;  Service: Vascular;  Laterality: Right;  . Patch angioplasty Right 05/13/2015    Procedure: RIGHT FEMORAL ARTERY PATCH ANGIOPLASTY;  Surgeon: Pryor Ochoa, MD;  Location: [facility] OR;  Service: Vascular;  Laterality: Right;  . Intraoperative arteriogram Right 05/13/2015    Procedure: RIGHT LOWER LEG INTRA OPERATIVE ARTERIOGRAM;  Surgeon: Pryor Ochoa, MD;  Location: [facility] OR;  Service: Vascular;  Laterality: Right;  . Thrombectomy femoral artery Right 05/13/2015    Procedure: THROMBECTOMY RIGHT FEMORAL-PROXIMAL POSTERIOR TIBAIL ARTERY BYPASS GRAFT;  Surgeon: Larina Earthly, MD;  Location: [facility] OR;  Service: Vascular;  Laterality: Right;  . Colonoscopy    . Femoral-tibial bypass graft Right 05/27/2015    Procedure: THROMBECTOMY OF RIGHT FEMORAL-POSTERIOR TIBIAL ARTERY SAPHENOUS VEIN BYPASS GRAFT ;  Surgeon: Pryor Ochoa, MD;  Location: [facility] OR;  Service: Vascular;  Laterality: Right;  . Intraoperative arteriogram Right 05/27/2015    Procedure: INTRA OPERATIVE ARTERIOGRAM;  Surgeon: Pryor Ochoa, MD;  Location: [facility] OR;  Service: Vascular;  Laterality: Right;    Allergies  Allergen Reactions  . Fish-Derived Products Anaphylaxis  . Penicillins Anaphylaxis    Current Outpatient Prescriptions  Medication Sig Dispense  Refill  . aspirin EC 81 MG tablet Take 81 mg by mouth daily.    Marland Kitchen atorvastatin (LIPITOR) 80 MG tablet Take 1 tablet (80 mg total) by mouth daily. 90 tablet 3  . levofloxacin (LEVAQUIN) 750 MG tablet Take 1 tablet (750 mg total) by mouth daily. 10 tablet 0  . metFORMIN (GLUCOPHAGE) 1000 MG tablet Take 1,000 mg by mouth 2 (two) times daily with a meal.   0  . metoprolol tartrate (LOPRESSOR) 25 MG tablet Take 0.5 tablets (12.5 mg total) by mouth daily. 45 tablet 3  . oxyCODONE (ROXICODONE) 5 MG immediate release tablet Take 1-2 tablets po. q 6 hrs/ prn 30 tablet 0  . warfarin (COUMADIN) 5 MG tablet Take 1 tablet (5 mg total) by mouth daily. 30 tablet 2   No current facility-administered medications for this visit.    ROS: See HPI for pertinent positives and negatives.   Physical Examination  Filed Vitals:   06/10/15 1207  BP: 132/74  Pulse: 77  Temp: 97.3 F (36.3 C)  TempSrc: Oral  Resp: 16  Height: 5\' 10"  (1.778 m)  Weight: 181 lb (82.101 kg)  SpO2: 99%   Body mass index is 25.97 kg/(m^2).  General: A&O x 3, WDWN  Eyes: Pupils are equal  Pulmonary: Respirations are non labored  Cardiac: RRR  Vascular: Vessel Right Left  Radial Palpable Palpable  Brachial Palpable Palpable  Carotid Palpable, without bruit Palpable, without bruit  Aorta Not palpable N/A  Femoral Palpable Palpable  Popliteal Not palpable Not palpable  PT Not Palpable Not Palpable  DP Not Palpable Not Palpable   Gastrointestinal: soft, NTND, -G/R, - HSM, - palpable masses, - CVAT B  Musculoskeletal: M/S 5/5 throughout, Extremities: Incisions of left leg are healing well. Right groin incision with moderate swelling and minimal erythema. Right calf medial incision with mild swelling and minimal erythema. Right 5th toe is dark to base of toe, right 4th toe tip is dark. Macerated area at medial aspect right 5th toe that is draining small amount serosanguinous drainage. Right  foot and lower leg are painful to touch. Right foot is slightly ruddy.    Neurologic: CN 2-12 grossly intact , Pain and light touch intact in extremities , Motor exam as listed above  Dermatologic: See M/S exam for extremity exam, no rashes otherwise noted          Non-Invasive Vascular Imaging: DATE: 06/10/2015 LOWER EXTREMITY ARTERIAL DUPLEX EVALUATION    INDICATION: PVD, Right foot pain and numbness, right little toe discoloration    PREVIOUS INTERVENTION(S): Status post right femoral to posterior  tibial bypass graft 04/24/2015 by Dr. Hart RochesterLawson. 05/27/2015 exploration and revision of the distal anastomosis.    DUPLEX EXAM:     RIGHT  LEFT   Peak Systolic Velocity (cm/s) Ratio (if abnormal) Waveform  Peak Systolic Velocity (cm/s) Ratio (if abnormal) Waveform  109  B Inflow Artery   T  36  B Proximal Anastomosis   T  60  M Proximal Graft   T  100  M Mid Graft   T  89  M  Distal Graft   T  84  M Distal Anastomosis   T  111  M Outflow Artery   T  .48 Today's ABI / TBI .62  .33 Previous ABI / TBI (  05/28/15 [facility]) .67    Waveform:    M - Monophasic       B - Biphasic       T - Triphasic  If Ankle Brachial Index (ABI) or Toe Brachial Index (TBI) performed, please see complete report  ADDITIONAL FINDINGS:     IMPRESSION: 1. Patent right femoral to posterior tibial artery bypass graft, distal anastomosis somewhat difficult to visualize    Compared to the previous exam:  No prior exam     ASSESSMENT: Chales SalmonJohn W Dysart is a 54 y.o. male who is s/p right femoral to posterior  tibial bypass graft 04/24/2015 by Dr. Hart RochesterLawson. 05/27/2015 exploration and revision of the distal anastomosis.  Pt returns for refill of his oxycodone for 8/10 pain in right foot and leg. [facility], was advised by his PCP on 06/02/15 to [facility] of his right foot infection with IV antibiotics and to get his blood sugar under control. Pt is vague and uncertain as to what medications  he is taking. He seems certain that he is taking metformin, not so certain that he took the prescribed po Levaquin; do not know if he is taking coumadin which was prescribed. Pt states that the darkening and drainage of his right 5th and 4th toes started after the above surgery; states he is cleansing this daily  with soap and water and advised to continue this, to keep these wounds clean and dry.  His atherosclerotic risk factors include uncontrolled DM and smoking.   PLAN:  Based on the patient's vascular studies and examination, and after discussing with Dr. Imogene Burn, pt will return to clinic at first available appointment with Dr. Hart Rochester.  Pt is advised to follow his PCP's advice ASAP which was to return to [facility] for management of infection in his toes, IV antibiotics, addressing hyperglycemia.  The patient was counseled re smoking cessation and given several free resources re smoking cessation.  Oxycodone 5 mg, 1-2 tablets every 6 hours as needed for severe pain, disp #20, 0 refills.   I discussed in depth with the patient the nature of atherosclerosis, and emphasized the importance of maximal medical management including strict control of blood pressure, blood glucose, and lipid levels, obtaining regular exercise, and cessation of smoking.  The patient is aware that without maximal medical management the underlying atherosclerotic disease process will progress, limiting the benefit of any interventions.  The patient was given information about PAD including signs, symptoms, treatment, what symptoms should prompt the patient to seek immediate medical care, and risk reduction measures to take.  Charisse March, RN, MSN, FNP-C [facility] [place] Phone: [phone]  Clinic MD: Imogene Burn  06/10/2015 12:19 PM

## 2015-06-18 ENCOUNTER — Telehealth: Payer: Self-pay | Admitting: *Deleted

## 2015-06-18 NOTE — Telephone Encounter (Signed)
Patient called to ask for stronger pain medication, His last Rx was on 06-10-15 for Oxycodone 5 mg #20 by our nurse practioner. On that day, a graft duplex was done and his right fem-tib was patent per our vascular tech, Hector Green. Hector Green reports that his pain in the foot is 10/10 and that he has more drainage and larger wounds on his toes. He can't tell me a lot about taking his Coumadin or his Levaquin; he says that he has not been back to see Hector Green or been evaluated at the ED. He thinks he may have a fever but doesn't have a thermometer. He says that he has not been able to check his Blood glucose lately "because he has moved, has no car and no friends to take him anywhere". He states that " he has been out of 'Oxycontin' for 3 days now because our nurse only gave him 15 pills on 06-10-15 and he had to take 2-3 every 4 hours to get any relief". I stressed to him numerous times that he needs to [facility] ED to be evaluated. His next appt with Hector Green is currently scheduled for 06-30-15.  He voiced understanding and said " he would see if he can get anyone to take him to the ED".

## 2015-06-19 ENCOUNTER — Telehealth: Payer: Self-pay | Admitting: *Deleted

## 2015-06-19 NOTE — Telephone Encounter (Signed)
Patient called and left message on triage line; he wanted a nurse to call him re: questions.  I tried to call him back but had to leave a message.

## 2015-06-21 ENCOUNTER — Emergency Department (HOSPITAL_COMMUNITY)
Admission: EM | Admit: 2015-06-21 | Discharge: 2015-06-21 | Disposition: A | Discharge status: Home / Self Care | Payer: Medicare Other | Attending: Emergency Medicine | Admitting: Emergency Medicine

## 2015-06-21 ENCOUNTER — Encounter (HOSPITAL_COMMUNITY): Payer: Self-pay | Admitting: Emergency Medicine

## 2015-06-21 DIAGNOSIS — I252 Old myocardial infarction: Secondary | ICD-10-CM | POA: Diagnosis not present

## 2015-06-21 DIAGNOSIS — Z720 Tobacco use: Secondary | ICD-10-CM | POA: Diagnosis not present

## 2015-06-21 DIAGNOSIS — Z7901 Long term (current) use of anticoagulants: Secondary | ICD-10-CM | POA: Insufficient documentation

## 2015-06-21 DIAGNOSIS — M25571 Pain in right ankle and joints of right foot: Secondary | ICD-10-CM | POA: Diagnosis present

## 2015-06-21 DIAGNOSIS — Z792 Long term (current) use of antibiotics: Secondary | ICD-10-CM | POA: Insufficient documentation

## 2015-06-21 DIAGNOSIS — Z79899 Other long term (current) drug therapy: Secondary | ICD-10-CM | POA: Diagnosis not present

## 2015-06-21 DIAGNOSIS — I2510 Atherosclerotic heart disease of native coronary artery without angina pectoris: Secondary | ICD-10-CM | POA: Insufficient documentation

## 2015-06-21 DIAGNOSIS — Z951 Presence of aortocoronary bypass graft: Secondary | ICD-10-CM | POA: Diagnosis not present

## 2015-06-21 DIAGNOSIS — Z9861 Coronary angioplasty status: Secondary | ICD-10-CM | POA: Diagnosis not present

## 2015-06-21 DIAGNOSIS — Z7982 Long term (current) use of aspirin: Secondary | ICD-10-CM | POA: Diagnosis not present

## 2015-06-21 DIAGNOSIS — E119 Type 2 diabetes mellitus without complications: Secondary | ICD-10-CM | POA: Diagnosis not present

## 2015-06-21 DIAGNOSIS — Z880 Allergy status to penicillin: Secondary | ICD-10-CM | POA: Diagnosis not present

## 2015-06-21 DIAGNOSIS — E785 Hyperlipidemia, unspecified: Secondary | ICD-10-CM | POA: Insufficient documentation

## 2015-06-21 DIAGNOSIS — Z9889 Other specified postprocedural states: Secondary | ICD-10-CM | POA: Diagnosis not present

## 2015-06-21 DIAGNOSIS — I96 Gangrene, not elsewhere classified: Secondary | ICD-10-CM | POA: Diagnosis not present

## 2015-06-21 DIAGNOSIS — I10 Essential (primary) hypertension: Secondary | ICD-10-CM | POA: Diagnosis not present

## 2015-06-21 MED ORDER — OXYCODONE-ACETAMINOPHEN 5-325 MG PO TABS: 1.0000 | ORAL_TABLET | Freq: Four times a day (QID) | ORAL | Status: DC | PRN

## 2015-06-21 NOTE — ED Notes (Signed)
Patient with no complaints at this time. Respirations even and unlabored. Skin warm/dry. Discharge instructions reviewed with patient at this time. Patient given opportunity to voice concerns/ask questions. Patient discharged at this time and left Emergency Department with steady gait.   

## 2015-06-21 NOTE — ED Provider Notes (Signed)
CSN: [id number]     Arrival date & time 06/21/15  1250 History   First MD Initiated Contact with Patient 06/21/15 1315     Chief Complaint  Patient presents with  . Foot Pain     (Consider location/radiation/quality/duration/timing/severity/associated sxs/prior Treatment) HPI.... Level V caveat for urgent need for intervention. Patient complains of black and right baby toe for approximately 10-14 days. Status post right femoral posterior tibial bypass procedure with a saphenous vein graft on 05/13/2015 by Dr. [place]Lawson in [place]... There is mild erythema surrounding the toe, but no foot tenderness or erythema. No fever or chills.  Past Medical History  Diagnosis Date  . CAD (coronary artery disease) 2011    [place]  . Essential hypertension   . Type 2 diabetes mellitus   . Hyperlipidemia   . Peripheral arterial disease   . Abnormal nuclear stress test 05/06/2015  . Myocardial infarction 2011   Past Surgical History  Procedure Laterality Date  . Coronary artery bypass graft  2011    Three vessel by report  . Abdominal aortagram N/A 03/30/2015    Procedure: ABDOMINAL Ronny FlurryAORTAGRAM;  Surgeon: Fransisco HertzBrian L Chen, MD;  [facility];  Service: Cardiovascular;  Laterality: N/A;  . Lower extremity angiogram N/A 03/30/2015    Procedure: LOWER EXTREMITY ANGIOGRAM;  Surgeon: Fransisco HertzBrian L Chen, MD;  [facility];  Service: Cardiovascular;  Laterality: N/A;  . Cardiac catheterization N/A 05/06/2015    Procedure: Left Heart Cath and Cors/Grafts Angiography;  Surgeon: Tonny BollmanMichael Cooper, MD;  [facility];  Service: Cardiovascular;  Laterality: N/A;  . Femoral-tibial bypass graft Right 05/13/2015    Procedure: BYPASS GRAFT RIGHT FEMORAL-POSTERIOR TIBIAL ARTERY USING LEFT NONREVERSED TRANSLOCATED GREATER SAPPHENOUS VEIN;  Surgeon: Pryor OchoaJames D Lawson, MD;  [facility];  Service: Vascular;  Laterality: Right;  . Vein harvest Left 05/13/2015    Procedure: LEFT  GREATER SAPPHENOUS VEIN HARVEST;  Surgeon: Pryor OchoaJames D Lawson, MD;  [facility];  Service: Vascular;  Laterality: Left;  . Endarterectomy femoral Right 05/13/2015    Procedure: RIGHT COMMON FEMORAL, SUPERFICIAL FEMORAL AND PROFUNDA ENDARTERECTOMY ;  Surgeon: Pryor OchoaJames D Lawson, MD;  [facility];  Service: Vascular;  Laterality: Right;  . Patch angioplasty Right 05/13/2015    Procedure: RIGHT FEMORAL ARTERY PATCH ANGIOPLASTY;  Surgeon: Pryor OchoaJames D Lawson, MD;  [facility];  Service: Vascular;  Laterality: Right;  . Intraoperative arteriogram Right 05/13/2015    Procedure: RIGHT LOWER LEG INTRA OPERATIVE ARTERIOGRAM;  Surgeon: Pryor OchoaJames D Lawson, MD;  [facility];  Service: Vascular;  Laterality: Right;  . Thrombectomy femoral artery Right 05/13/2015    Procedure: THROMBECTOMY RIGHT FEMORAL-PROXIMAL POSTERIOR TIBAIL ARTERY BYPASS GRAFT;  Surgeon: Larina Earthlyodd F Early, MD;  [facility];  Service: Vascular;  Laterality: Right;  . Colonoscopy    . Femoral-tibial bypass graft Right 05/27/2015    Procedure: THROMBECTOMY OF RIGHT FEMORAL-POSTERIOR TIBIAL ARTERY SAPHENOUS VEIN BYPASS GRAFT ;  Surgeon: Pryor OchoaJames D Lawson, MD;  [facility];  Service: Vascular;  Laterality: Right;  . Intraoperative arteriogram Right 05/27/2015    Procedure: INTRA OPERATIVE ARTERIOGRAM;  Surgeon: Pryor OchoaJames D Lawson, MD;  [facility];  Service: Vascular;  Laterality: Right;   Family History  Problem Relation Age of Onset  . Cancer Father    History  Substance Use Topics  . Smoking status: Light Tobacco Smoker -- 0.50 packs/day for 15 years    Types: Cigarettes    Start date:  08/24/1975  . Smokeless tobacco: Never Used  . Alcohol Use: No    Review of Systems  Unable to perform ROS: Acuity of condition      Allergies  Fish-derived products and Penicillins  Home Medications   Prior to Admission medications   Medication Sig Start Date End Date Taking? Authorizing Provider  aspirin EC 81 MG tablet Take 81 mg by mouth daily.    Yes Historical Provider, MD  atorvastatin (LIPITOR) 80 MG tablet Take 1 tablet (80 mg total) by mouth daily. 04/29/15  Yes Dyann Kief, PA-C  ibuprofen (ADVIL,MOTRIN) 200 MG tablet Take 800 mg by mouth every 6 (six) hours as needed for moderate pain.   Yes Historical Provider, MD  metFORMIN (GLUCOPHAGE) 1000 MG tablet Take 1,000 mg by mouth 2 (two) times daily with a meal.  04/16/15  Yes Historical Provider, MD  oxyCODONE (ROXICODONE) 5 MG immediate release tablet Take 1-2 tablets po. q 6 hrs/ prn Patient taking differently: Take 5-10 mg by mouth every 6 (six) hours as needed for moderate pain. Take 1-2 tablets po. q 6 hrs/ prn 06/10/15  Yes Suzanne L Nickel, NP  warfarin (COUMADIN) 5 MG tablet Take 1 tablet (5 mg total) by mouth daily. 05/29/15  Yes Samantha J Rhyne, PA-C  levofloxacin (LEVAQUIN) 750 MG tablet Take 1 tablet (750 mg total) by mouth daily. Patient not taking: Reported on 06/21/2015 05/29/15   Lelon Mast J Rhyne, PA-C  metoprolol tartrate (LOPRESSOR) 25 MG tablet Take 0.5 tablets (12.5 mg total) by mouth daily. Patient not taking: Reported on 06/21/2015 04/29/15   Dyann Kief, PA-C   BP 138/77 mmHg  Pulse 106  Temp(Src) 98 F (36.7 C) (Oral)  Resp 20  Ht  (1.778 m)  Wt 174 lb (78.926 kg)  BMI 24.97 kg/m2  SpO2 100% Physical Exam  Constitutional: He is oriented to person, place, and time. He appears well-developed and well-nourished.  HENT:  Head: Normocephalic and atraumatic.  Eyes: Conjunctivae and EOM are normal. Pupils are equal, round, and reactive to light.  Neck: Normal range of motion. Neck supple.  Cardiovascular: Normal rate and regular rhythm.   Pulmonary/Chest: Effort normal and breath sounds normal.  Abdominal: Soft. Bowel sounds are normal.  Musculoskeletal: Normal range of motion.  Neurological: He is alert and oriented to person, place, and time.  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 His behavior is normal.  Nursing note and vitals reviewed.   ED Course  Procedures (including critical care time) Labs Review Labs Reviewed - No data to display  Imaging Review No results found.   EKG Interpretation None      MDM   Final diagnoses:  Necrosis of toe    Will consult vascular surgeon.    Donnetta Hutching, MD 06/21/15 [phone]

## 2015-06-21 NOTE — ED Notes (Signed)
Patient c/o right foot pain. 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 Per patient has follow-up appointment for 7/19. Patient told if pain became to bad to come to our ER. Patient reports taking Lortab for pain with little relief.

## 2015-06-21 NOTE — Discharge Instructions (Signed)
I spoke with the doctor on call for Dr. Hart RochesterLawson.   . Office will call you tomorrow for a follow-up appointment. No specific treatment at this time.  I expect you will have your toe amputated

## 2015-06-23 ENCOUNTER — Ambulatory Visit: Payer: Medicare Other | Admitting: Vascular Surgery

## 2015-06-23 ENCOUNTER — Ambulatory Visit (INDEPENDENT_AMBULATORY_CARE_PROVIDER_SITE_OTHER): Payer: Self-pay | Admitting: Vascular Surgery

## 2015-06-23 ENCOUNTER — Encounter: Payer: Self-pay | Admitting: Vascular Surgery

## 2015-06-23 ENCOUNTER — Other Ambulatory Visit: Payer: Self-pay | Admitting: *Deleted

## 2015-06-23 VITALS — BP 129/78 | HR 101 | Temp 97.8°F | Resp 18 | Ht 70.0 in | Wt 175.0 lb

## 2015-06-23 DIAGNOSIS — I739 Peripheral vascular disease, unspecified: Secondary | ICD-10-CM

## 2015-06-23 DIAGNOSIS — M25571 Pain in right ankle and joints of right foot: Secondary | ICD-10-CM

## 2015-06-23 DIAGNOSIS — I70269 Atherosclerosis of native arteries of extremities with gangrene, unspecified extremity: Secondary | ICD-10-CM

## 2015-06-23 NOTE — Progress Notes (Signed)
Subjective:     Patient ID: Hector Green, male   DOB: Jan 19, 1961, 54 y.o.   MRN: [id number]  HPI this 54 year old male returns today with pain in his right fourth and fifth toes. This patient had an urgent femoral to posterior tibial bypass on June 1 for limb salvage. This occluded later that evening and required thrombectomy by Dr. early. Patient was discharged with the graft functioning but then returned on June 15 with the graft occluded once again had thrombectomy with exploration of both the proximal and distal anastomoses. Patient was started on Coumadin but then left the hospital AMA. He never went to the Coumadin clinic after discharge. He now is having pain in the right fourth and fifth toes. He also complains of some right calf discomfort.   Review of Systems     Objective:   Physical Exam BP 129/78 mmHg  Pulse 101  Temp(Src) 97.8 F (36.6 C)  Resp 18  Ht 5\' 10"  (1.778 m)  Wt 175 lb (79.379 kg)  BMI 25.11 kg/m2  Gen. well-developed well-nourished male in no apparent distress alert and oriented 3 Right leg with well-healed surgical incisions. 3+ graft pulse in the subcutaneous position the medial knee area and excellent Doppler flow in the graft as well as in the posterior tibial and anterior tibial arteries in the foot. Foot is pink and well perfused but the distal end of the fifth toe has dry gangrene and there is a fissure between the third and fourth toes with ulceration. This is quite tender to manipulation.     Assessment:     Functioning right femoral to posterior tibial bypass which occluded on 2 occasions but now has remained patent for 5 weeks-patient on Coumadin but this is not being controlled because he did not keep appointment to Coumadin clinic    Plan:     #1 refer patient to Dr. 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 Lajoyce Cornersduda

## 2015-06-24 ENCOUNTER — Other Ambulatory Visit: Payer: Self-pay | Admitting: *Deleted

## 2015-06-24 ENCOUNTER — Telehealth: Payer: Self-pay | Admitting: *Deleted

## 2015-06-24 NOTE — Telephone Encounter (Signed)
Appointment given for 12:00pm on Friday with Tammy.

## 2015-06-24 NOTE — Telephone Encounter (Signed)
I called Dr Rudi Heaponald Moore with [facility] to inquire if they would still be willing to manage patient's coumadin and diabetic medications as patient had left their office previously.  I spoke to [place], the triage nurse and she confirmed that they would see Hector Green.  I then called patient and explained to him that he was to call and speak to [place] today as she had said they would schedule him this week. Also I explained the importance of this appointment and getting the care as soon as possible.  Patient voiced understanding of the instructions and stated that he would call their office as soon as we ended our call.

## 2015-06-24 NOTE — Telephone Encounter (Signed)
Please call patient with appt - if I am to address both diabetes and protime will need 60 minute appt for new diabetic.

## 2015-06-24 NOTE — Telephone Encounter (Signed)
Telephone call from [place] a nurse at Dr. Diona BrownerJames Lawson's office who wants to know if we can manage [name]'s coumadin and diabetes. She reports that he is on the same dose of coumadin that he left the hospital on and wants to know if we will manage his coumadin. I told her we could do that at our office that we had PharmD's that managed patients coumadin. She is going to notify [name] to call our office and make an appointment with the clinical pharmacist. She also reports that they are making a stat referral to an orthopedic surgeon for 2 toes on his right foot..Marland Kitchen

## 2015-06-25 ENCOUNTER — Other Ambulatory Visit: Payer: Self-pay

## 2015-06-25 DIAGNOSIS — M79674 Pain in right toe(s): Secondary | ICD-10-CM

## 2015-06-25 DIAGNOSIS — I739 Peripheral vascular disease, unspecified: Secondary | ICD-10-CM

## 2015-06-25 NOTE — Telephone Encounter (Signed)
This patient has been noncompliant with our treatment plan as well as Dr. Hart RochesterLawson, who performed the bypass graft. Left AMA at his last visit with us - Dr. Christell ConstantMoore and myself both discussed the severity with him and advised him to go to the hospital. I have talked to Dr. Rodell PernaLawsons office and patient has been noncompliant and left hospital AMA also. Please review past  notes. Dr. Christell ConstantMoore suggested discharging him from the practice at last  visit with me. Tiffany A. Chauncey ReadingGann PA-C

## 2015-06-25 NOTE — Telephone Encounter (Signed)
Aware of appointment

## 2015-06-25 NOTE — Addendum Note (Signed)
Addended by: Adria DillELDRIDGE-LEWIS, Naisha Wisdom L on: 06/25/2015 01:23 PM   Modules accepted: Orders

## 2015-06-25 NOTE — Telephone Encounter (Signed)
Patient aware of appointment

## 2015-06-25 NOTE — Telephone Encounter (Signed)
Please call patient and give him a time to follow-up with clinical pharmacist for repeat pro times

## 2015-06-26 ENCOUNTER — Ambulatory Visit (INDEPENDENT_AMBULATORY_CARE_PROVIDER_SITE_OTHER): Payer: Medicare Other | Admitting: Pharmacist

## 2015-06-26 ENCOUNTER — Encounter: Payer: Self-pay | Admitting: Pharmacist

## 2015-06-26 VITALS — BP 142/82 | HR 80 | Ht 70.0 in | Wt 172.0 lb

## 2015-06-26 DIAGNOSIS — E1165 Type 2 diabetes mellitus with hyperglycemia: Secondary | ICD-10-CM | POA: Diagnosis not present

## 2015-06-26 DIAGNOSIS — I739 Peripheral vascular disease, unspecified: Secondary | ICD-10-CM | POA: Diagnosis not present

## 2015-06-26 DIAGNOSIS — F200 Paranoid schizophrenia: Secondary | ICD-10-CM

## 2015-06-26 LAB — POCT INR: INR: 0.9

## 2015-06-26 MED ORDER — GLIMEPIRIDE 4 MG PO TABS: 4.0000 mg | ORAL_TABLET | Freq: Every day | ORAL | Status: DC

## 2015-06-26 MED ORDER — GLUCOSE BLOOD VI STRP: ORAL_STRIP | Status: DC

## 2015-06-26 MED ORDER — ONETOUCH DELICA LANCETS 33G MISC: Status: DC

## 2015-06-26 NOTE — Patient Instructions (Signed)
Anticoagulation Dose Instructions as of 06/26/2015      Dorene Grebe Tue Wed Thu Fri Sat   New Dose 5 mg 7.5 mg 5 mg 7.5 mg 5 mg 7.5 mg 5 mg    Description        Take 2 tablet of warfarin 44m today - Friday, July 15th.  Then increase dose to Warfarin 569m- take 1 and 1/2 on mondays, wednesdays and fridays and 1 tablet all other days.       INR was 0.9 today (goal in 2.0 to 3.0)    Diabetes and Standards of Medical Care   Diabetes is complicated. You may find that your diabetes team includes a dietitian, nurse, diabetes educator, eye doctor, and more. To help everyone know what is going on and to help you get the care you deserve, the following schedule of care was developed to help keep you on track. Below are the tests, exams, vaccines, medicines, education, and plans you will need.  Blood Glucose Goals Prior to meals = 80 - 130 Within 2 hours of the start of a meal = less than 180  HbA1c test (goal is less than 7.0% - your last value was 11.1%) This test shows how well you have controlled your glucose over the past 2 to 3 months. It is used to see if your diabetes management plan needs to be adjusted.   It is performed at least 2 times a year if you are meeting treatment goals.  It is performed 4 times a year if therapy has changed or if you are not meeting treatment goals.  Blood pressure test  This test is performed at every routine medical visit. The goal is less than 140/90 mmHg for most people, but 130/80 mmHg in some cases. Ask your health care provider about your goal.  Dental exam  Follow up with the dentist regularly.  Eye exam  If you are diagnosed with type 1 diabetes as a child, get an exam upon reaching the age of 1021ears or older and have had diabetes for 3 to 5 years. Yearly eye exams are recommended after that initial eye exam.  If you are diagnosed with type 1 diabetes as an adult, get an exam within 5 years of diagnosis and then yearly.  If you are  diagnosed with type 2 diabetes, get an exam as soon as possible after the diagnosis and then yearly.  Foot care exam  Visual foot exams are performed at every routine medical visit. The exams check for cuts, injuries, or other problems with the feet.  A comprehensive foot exam should be done yearly. This includes visual inspection as well as assessing foot pulses and testing for loss of sensation.  Check your feet nightly for cuts, injuries, or other problems with your feet. Tell your health care provider if anything is not healing.  Kidney function test (urine microalbumin)  This test is performed once a year.  Type 1 diabetes: The first test is performed 5 years after diagnosis.  Type 2 diabetes: The first test is performed at the time of diagnosis.  A serum creatinine and estimated glomerular filtration rate (eGFR) test is done once a year to assess the level of chronic kidney disease (CKD), if present.  Lipid profile (cholesterol, HDL, LDL, triglycerides)  Performed every 5 years for most people.  The goal for LDL is less than 100 mg/dL. If you are at high risk, the goal is less than 70 mg/dL.  The  goal for HDL is 40 mg/dL to 50 mg/dL for men and 50 mg/dL to 60 mg/dL for women. An HDL cholesterol of 60 mg/dL or higher gives some protection against heart disease.  The goal for triglycerides is less than 150 mg/dL.  Influenza vaccine, pneumococcal vaccine, and hepatitis B vaccine  The influenza vaccine is recommended yearly.  The pneumococcal vaccine is generally given once in a lifetime. However, there are some instances when another vaccination is recommended. Check with your health care provider.  The hepatitis B vaccine is also recommended for adults with diabetes.  Diabetes self-management education  Education is recommended at diagnosis and ongoing as needed.  Treatment plan  Your treatment plan is reviewed at every medical visit.  Document Released: 09/25/2009  Document Revised: 07/31/2013 Document Reviewed: 04/30/2013 [facility] Patient Information 2014 [place].

## 2015-06-26 NOTE — Progress Notes (Signed)
Patient ID: Hector SalmonJohn W Everson, male   DOB: 1961/03/17, 54 y.o.   MRN: [id number]  Subjective:    Hector Green is a 54 y.o. male who presents for an initial evaluation of Type 2 diabetes mellitus and to have protime rechecked.  [name] had posterior tibial bypass 05/13/15 which has become occluded x2.  He was started on warfarin 5mg  1 tablet daily 05/2015 but has not had INR checked since 05/29/15. Patient is scheduled to be seen by Dr Lajoyce Cornersuda for evaluation of possible need for amputation of fourth and fifth toes.  Appt is 06/30/2015.  Patient was diagnosed with type 2 DM about 8 or 9 years ago.  He has only taken metformin in that past.   Current symptoms/problems include foot ulcerations, hyperglycemia and weight loss and have been worsening.   Known diabetic complications: peripheral neuropathy and peripheral vascular disease Cardiovascular risk factors: diabetes mellitus, dyslipidemia, hypertension, male gender, obesity (BMI >= 30 kg/m2), sedentary lifestyle and smoking/ tobacco exposure Current diabetic medications include metformin 1000mg  1 tablet bid.  Patient states that he is afraid of needles.  Eye exam current (within one year): unknown Weight trend: decreasing rapidly Prior visit with dietician: no Current diet: in general, an "unhealthy" diet Current exercise: none  Current monitoring regimen: none Home blood sugar records: not checking Any episodes of hypoglycemia? no  Is He on ACE inhibitor or angiotensin II receptor blocker?  No     The following portions of the patient's history were reviewed and updated as appropriate: allergies, current medications, past family history, past medical history, past social history, past surgical history and problem list.   Objective:    BP 142/82 mmHg  Pulse 80  Ht 5\' 10"  (1.778 m)  Wt 172 lb (78.019 kg)  BMI 24.68 kg/m2   INR was 0.9 in office today  Lab Review GLUCOSE, BLD (mg/dL)  Date Value  84/69/629506/16/2016 206*  05/27/2015 352*   05/14/2015 235*   CO2 (mmol/L)  Date Value  05/28/2015 26  05/27/2015 23  05/14/2015 23   BUN (mg/dL)  Date Value  28/41/324406/16/2016 9  05/27/2015 17  05/14/2015 13   CREAT (mg/dL)  Date Value  01/02/725305/18/2016 0.96   CREATININE, SER (mg/dL)  Date Value  66/44/034706/16/2016 0.89  05/27/2015 0.90  05/14/2015 0.91    Assessment:    Diabetes Mellitus type II, under inadequate control.   subtherapeutic anticoagulation - h/o thrombus x2 in right leg post surgery  Plan:    1.  Rx changes: add glimepiride 4mg  take 1 tablet daily   Continue metformin 1000mg  1 tablet bid 2.  Education: Reviewed 'ABCs' of diabetes management (respective goals in parentheses):  A1C (<7), blood pressure (<130/80), and cholesterol (LDL <100).  Taught how to check BG (patient would not check himself in office but states he has someone at home that will check for him.  [facility] for glucose test strips and lancets.  3.  Compliance at present is estimated to be poor. Efforts to improve compliance (if necessary) will be directed at dietary modifications: discussed CHO counting diet.   Reviewed serving sizes.  Recommended 45 to 50 grams of CHO per meal and 15 to 20 grams per snack.  and regular blood sugar monitoring: two times daily.  4.   Anticoagulation Dose Instructions as of 06/26/2015      Glynis SmilesSun Mon Tue Wed Thu Fri Sat   New Dose 5 mg 7.5 mg 5 mg 7.5 mg 5 mg 7.5 mg  5 mg    Description        Take 2 tablet of warfarin  today - Friday, July 15th.  Then increase dose to Warfarin  - take 1 and 1/2 on mondays, wednesdays and fridays and 1 tablet all other days.       5.  RTC in 1 week with HBG reading and to recheck INR.  Henrene Pastor, PharmD, CPP, CDE

## 2015-06-29 ENCOUNTER — Telehealth: Payer: Self-pay

## 2015-06-29 DIAGNOSIS — I70269 Atherosclerosis of native arteries of extremities with gangrene, unspecified extremity: Secondary | ICD-10-CM

## 2015-06-29 DIAGNOSIS — I739 Peripheral vascular disease, unspecified: Secondary | ICD-10-CM

## 2015-06-29 MED ORDER — OXYCODONE HCL 10 MG PO TABS: 10.0000 mg | ORAL_TABLET | Freq: Four times a day (QID) | ORAL | Status: DC | PRN

## 2015-06-29 NOTE — Telephone Encounter (Signed)
Phone call from pt.  Requested refill of his pain medication.  Reported he has pain from right ankle down to toes.  Stated he is taking the pain medication about every 4 hrs, due to the "excruciating pain of the right foot."   Stated he takes a 2nd pain pill after 30 min. If no relief from the 1st pill.  Stated the color of the right foot is pink, except for the black discoloration of the 5th toe.  Denied any change in temperature of right foot to cool or cold.   Reported his appt. with Dr. Lajoyce Cornersuda is on 7/28.  Discussed with Dr. Hart RochesterLawson.  Authorized to give refill on Oxycodone 10 mg. Tablets; # 40; no refills.  Office will try to arrange for pt. to get an earlier appt. With Dr. Lajoyce Cornersuda.  Pt. notified of Oxycodone prescription to be picked up at the office, and of office to notify Dr. Audrie Liauda's office to arrange an earlier appt.  Verb. Understanding.

## 2015-06-30 ENCOUNTER — Ambulatory Visit: Payer: Medicare Other | Admitting: Vascular Surgery

## 2015-07-01 ENCOUNTER — Telehealth: Payer: Self-pay | Admitting: Vascular Surgery

## 2015-07-01 NOTE — Telephone Encounter (Signed)
-----   Message from Phillips Odorarol S Pullins, RN sent at 06/30/2015  9:36 AM EDT ----- Regarding: RE: needs appt. with Dr. Lajoyce Cornersuda sooner than 7/28 Contact: [phone] I called [name], and he agreed to the appt. on 7/21 @ 9:15 AM. W/ Dr. Lajoyce Cornersuda.  Thanks Annabelle Harmanana!   ----- Message -----    From: Fredrich Birksana P Millikan    Sent: 06/29/2015   5:06 PM      To: Phillips Odorarol S Pullins, RN Subject: RE: needs appt. with Dr. Lajoyce Cornersuda sooner than 7/#  Hi Okey Regalarol,  I spoke with Dr Audrie Liauda's office and they moved Mr [name] up to 07/02/2015 @ 9:15am. I was unable to reach Mr [name] today. I am in Leadership Training 06/30/15 all day. Would you mind to try and call him for me? If you can't, I will try to call again on Wednesday.  Thanks! Annabelle Harmanana ----- Message -----    From: Phillips Odorarol S Pullins, RN    Sent: 06/29/2015   3:20 PM      To: [facility] Subject: needs appt. with Dr. Lajoyce Cornersuda sooner than 7/28     Dr. Hart RochesterLawson would like this pt. to be seen by Dr. Lajoyce Cornersuda sooner than 7/28, if possible, to evaluate the right foot- 4th and 5th toes. Can you check with their office re: this?

## 2015-07-02 DIAGNOSIS — I70261 Atherosclerosis of native arteries of extremities with gangrene, right leg: Secondary | ICD-10-CM | POA: Diagnosis not present

## 2015-07-02 DIAGNOSIS — Z716 Tobacco abuse counseling: Secondary | ICD-10-CM | POA: Diagnosis not present

## 2015-07-02 DIAGNOSIS — F17210 Nicotine dependence, cigarettes, uncomplicated: Secondary | ICD-10-CM | POA: Diagnosis not present

## 2015-07-03 ENCOUNTER — Ambulatory Visit: Payer: Medicare Other

## 2015-07-06 ENCOUNTER — Telehealth: Payer: Self-pay | Admitting: Pharmacist

## 2015-07-06 ENCOUNTER — Other Ambulatory Visit (HOSPITAL_COMMUNITY): Payer: Self-pay | Admitting: Orthopedic Surgery

## 2015-07-06 NOTE — Telephone Encounter (Signed)
Called patient to remind about protime recheck and make appt since he missed last week.  Patient states he is having surgery to remove portion of leg on Wednesday, July 27th.  Will make follow up appt once he is released to reassess protime and diabetes.

## 2015-07-07 ENCOUNTER — Encounter (HOSPITAL_COMMUNITY): Payer: Self-pay | Admitting: *Deleted

## 2015-07-07 MED ORDER — CLINDAMYCIN PHOSPHATE 900 MG/50ML IV SOLN
900.0000 mg | INTRAVENOUS | Status: AC
  Administered 2015-07-08: 900 mg via INTRAVENOUS
  Filled 2015-07-07: qty 50

## 2015-07-07 NOTE — Progress Notes (Signed)
Spoke with pt for pre-op call. He denies any recent chest pain or sob. He is on Coumadin and Dr. Lajoyce Corners did not tell him to stop it or his Aspirin.

## 2015-07-07 NOTE — Progress Notes (Signed)
Pt is 54 year old male scheduled for R below the knee amputation on 07/08/2015 with Dr. Lajoyce Corners.   Pt is same day work up.   PMH includes: CAD (s/p CABG 2011), HTN, hyperlipidemia, DM, PAD, paranoid schizophrenia. Current smoker. BMI 25. S/p thrombectomy of R F-PTBPG 05/27/15. S/p thrombectomy of R F-PTBPG 05/13/15. S/p F-PTBPG, R common femoral, superficial femoral and profunda endarterectomy, R femoral artery patch angioplasty on 05/13/15.   Medications include: ASA, lipitor, glimepiride, metformin, coumadin. Pt reported to PAT RN he is not to stop coumadin prior to surgery.   Labs will be obtained DOS. Most recent HgbA1c on 05/27/15 was 11.1.   EKG 05/06/2015: Sinus rhythm with occasional PVCs. New since previous tracing. Possible Left atrial enlargement. Left axis deviation. Incomplete right bundle branch block  Cardiac cath 05/06/2015 for high risk stress test: 1. Severe native three-vessel coronary artery disease with total occlusion of the proximal RCA, total occlusion of the proximal left circumflex, and total occlusion of the LAD after the first septal perforator 2. Status post aortocoronary bypass surgery with continued patency of the LIMA to LAD, saphenous vein graft to intermediate, and 7 his vein graft to right PDA 3. Total occlusion of the AV groove circumflex collateralized from the saphenous vein graft to intermediate 4. Moderately severe segmental left ventricular systolic dysfunction with LVEF estimated at 35% --Discussion: The patient has continued patency of his bypass grafts. He may have ischemia in the distribution of the second and third obtuse marginal branches which are collateralized. Otherwise, he has what appears to be normal perfusion to the LAD, diagonal/intermediate, and RCA territories based on patency of all bypass grafts. I suspect he will be able to proceed with surgery and there does not appear to be any indication for further coronary revascularization.  If pre-operative  labs acceptable, I anticipate pt can proceed as scheduled.   Rica Mast, FNP-BC [facility]/Anesthesiology Phone: [phone] 07/07/2015 1:49 PM

## 2015-07-08 ENCOUNTER — Encounter (HOSPITAL_COMMUNITY): Payer: Self-pay | Admitting: *Deleted

## 2015-07-08 ENCOUNTER — Encounter (HOSPITAL_COMMUNITY)
Admission: RE | Disposition: A | Discharge status: Home Health | Payer: Self-pay | Source: Ambulatory Visit | Attending: Orthopedic Surgery

## 2015-07-08 ENCOUNTER — Inpatient Hospital Stay (HOSPITAL_COMMUNITY): Payer: Medicare Other | Admitting: Emergency Medicine

## 2015-07-08 ENCOUNTER — Inpatient Hospital Stay (HOSPITAL_COMMUNITY)
Admission: RE | Admit: 2015-07-08 | Discharge: 2015-07-10 | DRG: 240 | Disposition: A | Discharge status: Home Health | Payer: Medicare Other | Source: Ambulatory Visit | Attending: Orthopedic Surgery | Admitting: Orthopedic Surgery

## 2015-07-08 ENCOUNTER — Inpatient Hospital Stay (HOSPITAL_COMMUNITY): Payer: Medicare Other

## 2015-07-08 DIAGNOSIS — Z91013 Allergy to seafood: Secondary | ICD-10-CM

## 2015-07-08 DIAGNOSIS — Z7901 Long term (current) use of anticoagulants: Secondary | ICD-10-CM | POA: Diagnosis not present

## 2015-07-08 DIAGNOSIS — Z951 Presence of aortocoronary bypass graft: Secondary | ICD-10-CM | POA: Diagnosis not present

## 2015-07-08 DIAGNOSIS — Z79899 Other long term (current) drug therapy: Secondary | ICD-10-CM

## 2015-07-08 DIAGNOSIS — I252 Old myocardial infarction: Secondary | ICD-10-CM

## 2015-07-08 DIAGNOSIS — E1152 Type 2 diabetes mellitus with diabetic peripheral angiopathy with gangrene: Principal | ICD-10-CM | POA: Diagnosis present

## 2015-07-08 DIAGNOSIS — I2582 Chronic total occlusion of coronary artery: Secondary | ICD-10-CM | POA: Diagnosis present

## 2015-07-08 DIAGNOSIS — E785 Hyperlipidemia, unspecified: Secondary | ICD-10-CM | POA: Diagnosis present

## 2015-07-08 DIAGNOSIS — I70261 Atherosclerosis of native arteries of extremities with gangrene, right leg: Secondary | ICD-10-CM | POA: Diagnosis not present

## 2015-07-08 DIAGNOSIS — I10 Essential (primary) hypertension: Secondary | ICD-10-CM | POA: Diagnosis not present

## 2015-07-08 DIAGNOSIS — Z880 Allergy status to penicillin: Secondary | ICD-10-CM | POA: Diagnosis not present

## 2015-07-08 DIAGNOSIS — I2510 Atherosclerotic heart disease of native coronary artery without angina pectoris: Secondary | ICD-10-CM | POA: Diagnosis present

## 2015-07-08 DIAGNOSIS — IMO0002 Reserved for concepts with insufficient information to code with codable children: Secondary | ICD-10-CM

## 2015-07-08 DIAGNOSIS — Z0389 Encounter for observation for other suspected diseases and conditions ruled out: Secondary | ICD-10-CM | POA: Diagnosis not present

## 2015-07-08 DIAGNOSIS — L97814 Non-pressure chronic ulcer of other part of right lower leg with necrosis of bone: Secondary | ICD-10-CM | POA: Diagnosis not present

## 2015-07-08 DIAGNOSIS — F200 Paranoid schizophrenia: Secondary | ICD-10-CM | POA: Diagnosis present

## 2015-07-08 DIAGNOSIS — Z7982 Long term (current) use of aspirin: Secondary | ICD-10-CM | POA: Diagnosis not present

## 2015-07-08 DIAGNOSIS — F17210 Nicotine dependence, cigarettes, uncomplicated: Secondary | ICD-10-CM | POA: Diagnosis present

## 2015-07-08 DIAGNOSIS — I96 Gangrene, not elsewhere classified: Secondary | ICD-10-CM | POA: Diagnosis not present

## 2015-07-08 HISTORY — PX: BELOW KNEE LEG AMPUTATION: SUR23

## 2015-07-08 HISTORY — PX: AMPUTATION: SHX166

## 2015-07-08 LAB — CBC
HCT: 43.3 % (ref 39.0–52.0)
HEMOGLOBIN: 15.1 g/dL (ref 13.0–17.0)
MCH: 29.1 pg (ref 26.0–34.0)
MCHC: 34.9 g/dL (ref 30.0–36.0)
MCV: 83.4 fL (ref 78.0–100.0)
Platelets: 154 10*3/uL (ref 150–400)
RBC: 5.19 MIL/uL (ref 4.22–5.81)
RDW: 13.2 % (ref 11.5–15.5)
WBC: 9.3 10*3/uL (ref 4.0–10.5)

## 2015-07-08 LAB — APTT: aPTT: 27 seconds (ref 24–37)

## 2015-07-08 LAB — PROTIME-INR
INR: 1.01 (ref 0.00–1.49)
Prothrombin Time: 13.5 seconds (ref 11.6–15.2)

## 2015-07-08 LAB — COMPREHENSIVE METABOLIC PANEL
ALBUMIN: 3.5 g/dL (ref 3.5–5.0)
ALK PHOS: 79 U/L (ref 38–126)
ALT: 13 U/L — AB (ref 17–63)
AST: 13 U/L — ABNORMAL LOW (ref 15–41)
Anion gap: 9 (ref 5–15)
BUN: 22 mg/dL — ABNORMAL HIGH (ref 6–20)
CO2: 23 mmol/L (ref 22–32)
Calcium: 9.4 mg/dL (ref 8.9–10.3)
Chloride: 99 mmol/L — ABNORMAL LOW (ref 101–111)
Creatinine, Ser: 0.94 mg/dL (ref 0.61–1.24)
GFR calc Af Amer: >60 mL/min (ref >60)
GFR calc non Af Amer: >60 mL/min (ref >60)
GLUCOSE: 293 mg/dL — AB (ref 65–99)
POTASSIUM: 4 mmol/L (ref 3.5–5.1)
Sodium: 131 mmol/L — ABNORMAL LOW (ref 135–145)
Total Bilirubin: 0.7 mg/dL (ref 0.3–1.2)
Total Protein: 7.1 g/dL (ref 6.5–8.1)

## 2015-07-08 LAB — GLUCOSE, CAPILLARY
GLUCOSE-CAPILLARY: 186 mg/dL — AB (ref 65–99)
Glucose-Capillary: 266 mg/dL — ABNORMAL HIGH (ref 65–99)
Glucose-Capillary: 261 mg/dL — ABNORMAL HIGH (ref 65–99)
Glucose-Capillary: 276 mg/dL — ABNORMAL HIGH (ref 65–99)

## 2015-07-08 SURGERY — AMPUTATION BELOW KNEE
Anesthesia: General | Laterality: Right

## 2015-07-08 MED ORDER — ACETAMINOPHEN 325 MG PO TABS
650.0000 mg | ORAL_TABLET | Freq: Four times a day (QID) | ORAL | Status: DC | PRN
  Administered 2015-07-10: 650 mg via ORAL

## 2015-07-08 MED ORDER — DOCUSATE SODIUM 100 MG PO CAPS
100.0000 mg | ORAL_CAPSULE | Freq: Two times a day (BID) | ORAL | Status: DC
  Administered 2015-07-08 – 2015-07-10 (×5): 100 mg via ORAL
  Filled 2015-07-08 (×5): qty 1

## 2015-07-08 MED ORDER — ONDANSETRON HCL 4 MG/2ML IJ SOLN
INTRAMUSCULAR | Status: DC | PRN
  Administered 2015-07-08: 4 mg via INTRAVENOUS

## 2015-07-08 MED ORDER — ATORVASTATIN CALCIUM 80 MG PO TABS
80.0000 mg | ORAL_TABLET | Freq: Every day | ORAL | Status: DC
  Administered 2015-07-08 – 2015-07-10 (×3): 80 mg via ORAL
  Filled 2015-07-08 (×3): qty 1

## 2015-07-08 MED ORDER — SODIUM CHLORIDE 0.9 % IV SOLN
INTRAVENOUS | Status: DC
  Administered 2015-07-08: 17:00:00 via INTRAVENOUS

## 2015-07-08 MED ORDER — METOCLOPRAMIDE HCL 5 MG/ML IJ SOLN
INTRAMUSCULAR | Status: AC
  Administered 2015-07-08: 10 mg via INTRAVENOUS
  Filled 2015-07-08: qty 2

## 2015-07-08 MED ORDER — MIDAZOLAM HCL 5 MG/5ML IJ SOLN
INTRAMUSCULAR | Status: DC | PRN
  Administered 2015-07-08: 2 mg via INTRAVENOUS

## 2015-07-08 MED ORDER — PROPOFOL 10 MG/ML IV BOLUS
INTRAVENOUS | Status: DC | PRN
  Administered 2015-07-08: 100 mg via INTRAVENOUS

## 2015-07-08 MED ORDER — LACTATED RINGERS IV SOLN
INTRAVENOUS | Status: DC
  Administered 2015-07-08: 09:00:00 via INTRAVENOUS

## 2015-07-08 MED ORDER — ACETAMINOPHEN 650 MG RE SUPP
650.0000 mg | Freq: Four times a day (QID) | RECTAL | Status: DC | PRN
  Filled 2015-07-08: qty 1

## 2015-07-08 MED ORDER — FENTANYL CITRATE (PF) 100 MCG/2ML IJ SOLN
INTRAMUSCULAR | Status: DC | PRN
  Administered 2015-07-08: 50 ug via INTRAVENOUS
  Administered 2015-07-08 (×2): 100 ug via INTRAVENOUS
  Administered 2015-07-08 (×2): 50 ug via INTRAVENOUS

## 2015-07-08 MED ORDER — METOCLOPRAMIDE HCL 5 MG PO TABS
5.0000 mg | ORAL_TABLET | Freq: Three times a day (TID) | ORAL | Status: DC | PRN
Start: 2015-07-08 — End: 2015-07-10

## 2015-07-08 MED ORDER — MIDAZOLAM HCL 2 MG/2ML IJ SOLN
INTRAMUSCULAR | Status: AC
  Filled 2015-07-08: qty 2

## 2015-07-08 MED ORDER — OXYCODONE HCL 5 MG PO TABS
ORAL_TABLET | ORAL | Status: AC
  Administered 2015-07-08: 10 mg via ORAL
  Filled 2015-07-08: qty 2

## 2015-07-08 MED ORDER — OXYCODONE HCL 5 MG PO TABS
5.0000 mg | ORAL_TABLET | ORAL | Status: DC | PRN
  Administered 2015-07-08 – 2015-07-10 (×8): 10 mg via ORAL
  Filled 2015-07-08 (×9): qty 2

## 2015-07-08 MED ORDER — FENTANYL CITRATE (PF) 250 MCG/5ML IJ SOLN
INTRAMUSCULAR | Status: AC
  Filled 2015-07-08: qty 5

## 2015-07-08 MED ORDER — CLINDAMYCIN PHOSPHATE 600 MG/50ML IV SOLN
600.0000 mg | Freq: Four times a day (QID) | INTRAVENOUS | Status: AC
  Administered 2015-07-08 – 2015-07-09 (×3): 600 mg via INTRAVENOUS
  Filled 2015-07-08 (×3): qty 50

## 2015-07-08 MED ORDER — METFORMIN HCL 500 MG PO TABS
1000.0000 mg | ORAL_TABLET | Freq: Two times a day (BID) | ORAL | Status: DC
  Administered 2015-07-08 – 2015-07-10 (×4): 1000 mg via ORAL
  Filled 2015-07-08 (×4): qty 2

## 2015-07-08 MED ORDER — WARFARIN - PHYSICIAN DOSING INPATIENT
Freq: Every day | Status: DC
  Administered 2015-07-09: 17:00:00

## 2015-07-08 MED ORDER — HYDROMORPHONE HCL 1 MG/ML IJ SOLN
1.0000 mg | INTRAMUSCULAR | Status: DC | PRN
  Administered 2015-07-08 – 2015-07-10 (×11): 1 mg via INTRAVENOUS
  Filled 2015-07-08 (×11): qty 1

## 2015-07-08 MED ORDER — HYDROMORPHONE HCL 1 MG/ML IJ SOLN
INTRAMUSCULAR | Status: AC
  Administered 2015-07-08: 0.5 mg via INTRAVENOUS
  Filled 2015-07-08: qty 1

## 2015-07-08 MED ORDER — HYDROMORPHONE HCL 1 MG/ML IJ SOLN
0.2500 mg | INTRAMUSCULAR | Status: DC | PRN
  Administered 2015-07-08 (×4): 0.5 mg via INTRAVENOUS

## 2015-07-08 MED ORDER — METOCLOPRAMIDE HCL 5 MG/ML IJ SOLN
5.0000 mg | Freq: Three times a day (TID) | INTRAMUSCULAR | Status: DC | PRN
  Administered 2015-07-08: 10 mg via INTRAVENOUS

## 2015-07-08 MED ORDER — ONDANSETRON HCL 4 MG PO TABS: 4.0000 mg | ORAL_TABLET | Freq: Four times a day (QID) | ORAL | Status: DC | PRN

## 2015-07-08 MED ORDER — ASPIRIN EC 81 MG PO TBEC
81.0000 mg | DELAYED_RELEASE_TABLET | Freq: Every day | ORAL | Status: DC
  Administered 2015-07-08 – 2015-07-10 (×3): 81 mg via ORAL
  Filled 2015-07-08 (×3): qty 1

## 2015-07-08 MED ORDER — DEXTROSE 5 % IV SOLN
500.0000 mg | Freq: Four times a day (QID) | INTRAVENOUS | Status: DC | PRN
  Filled 2015-07-08: qty 5

## 2015-07-08 MED ORDER — ONDANSETRON HCL 4 MG/2ML IJ SOLN
INTRAMUSCULAR | Status: AC
  Administered 2015-07-08: 4 mg via INTRAVENOUS
  Filled 2015-07-08: qty 2

## 2015-07-08 MED ORDER — ACETAMINOPHEN 10 MG/ML IV SOLN
INTRAVENOUS | Status: AC
  Filled 2015-07-08: qty 100

## 2015-07-08 MED ORDER — DEXTROSE 5 % IV SOLN
500.0000 mg | INTRAVENOUS | Status: AC
  Administered 2015-07-08: 500 mg via INTRAVENOUS
  Filled 2015-07-08: qty 5

## 2015-07-08 MED ORDER — WARFARIN SODIUM 5 MG PO TABS
5.0000 mg | ORAL_TABLET | Freq: Every day | ORAL | Status: DC
  Administered 2015-07-08 – 2015-07-09 (×2): 5 mg via ORAL
  Filled 2015-07-08 (×2): qty 1

## 2015-07-08 MED ORDER — METHOCARBAMOL 500 MG PO TABS
500.0000 mg | ORAL_TABLET | Freq: Four times a day (QID) | ORAL | Status: DC | PRN
  Administered 2015-07-08 – 2015-07-10 (×4): 500 mg via ORAL
  Filled 2015-07-08 (×4): qty 1

## 2015-07-08 MED ORDER — ONDANSETRON HCL 4 MG/2ML IJ SOLN
4.0000 mg | Freq: Four times a day (QID) | INTRAMUSCULAR | Status: DC | PRN
  Administered 2015-07-08: 4 mg via INTRAVENOUS

## 2015-07-08 MED ORDER — PROPOFOL 10 MG/ML IV BOLUS
INTRAVENOUS | Status: AC
  Filled 2015-07-08: qty 20

## 2015-07-08 MED ORDER — GLIMEPIRIDE 4 MG PO TABS
4.0000 mg | ORAL_TABLET | Freq: Every day | ORAL | Status: DC
  Administered 2015-07-09 – 2015-07-10 (×2): 4 mg via ORAL
  Filled 2015-07-08 (×2): qty 1

## 2015-07-08 MED ORDER — CHLORHEXIDINE GLUCONATE 4 % EX LIQD: 60.0000 mL | Freq: Once | CUTANEOUS | Status: DC

## 2015-07-08 MED ORDER — 0.9 % SODIUM CHLORIDE (POUR BTL) OPTIME
TOPICAL | Status: DC | PRN
  Administered 2015-07-08: 1000 mL

## 2015-07-08 MED ORDER — HYDROMORPHONE HCL 1 MG/ML IJ SOLN
INTRAMUSCULAR | Status: AC
  Filled 2015-07-08: qty 1

## 2015-07-08 MED ORDER — LIDOCAINE HCL (CARDIAC) 20 MG/ML IV SOLN
INTRAVENOUS | Status: DC | PRN
  Administered 2015-07-08: 60 mg via INTRAVENOUS

## 2015-07-08 MED ORDER — ACETAMINOPHEN 10 MG/ML IV SOLN
INTRAVENOUS | Status: DC | PRN
  Administered 2015-07-08: 1000 mg via INTRAVENOUS

## 2015-07-08 MED ORDER — INSULIN ASPART 100 UNIT/ML ~~LOC~~ SOLN
0.0000 [IU] | Freq: Three times a day (TID) | SUBCUTANEOUS | Status: DC
  Administered 2015-07-08: 8 [IU] via SUBCUTANEOUS

## 2015-07-08 SURGICAL SUPPLY — 41 items
BLADE RECIPROCATING TAPERED (BLADE) ×3 IMPLANT
BLADE SAW RECIP 87.9 MT (BLADE) ×3 IMPLANT
BLADE SURG 21 STRL SS (BLADE) ×6 IMPLANT
BNDG COHESIVE 6X5 TAN STRL LF (GAUZE/BANDAGES/DRESSINGS) ×6 IMPLANT
BNDG GAUZE ELAST 4 BULKY (GAUZE/BANDAGES/DRESSINGS) ×3 IMPLANT
CLIP TI MEDIUM 24 (CLIP) ×3 IMPLANT
COVER SURGICAL LIGHT HANDLE (MISCELLANEOUS) ×6 IMPLANT
CUFF TOURNIQUET SINGLE 34IN LL (TOURNIQUET CUFF) IMPLANT
CUFF TOURNIQUET SINGLE 44IN (TOURNIQUET CUFF) IMPLANT
DRAPE EXTREMITY T 121X128X90 (DRAPE) ×3 IMPLANT
DRAPE PROXIMA HALF (DRAPES) ×6 IMPLANT
DRAPE U-SHAPE 47X51 STRL (DRAPES) ×3 IMPLANT
DRSG ADAPTIC 3X8 NADH LF (GAUZE/BANDAGES/DRESSINGS) ×3 IMPLANT
DRSG PAD ABDOMINAL 8X10 ST (GAUZE/BANDAGES/DRESSINGS) ×6 IMPLANT
DURAPREP 26ML APPLICATOR (WOUND CARE) ×3 IMPLANT
ELECT REM PT RETURN 9FT ADLT (ELECTROSURGICAL) ×3
ELECTRODE REM PT RTRN 9FT ADLT (ELECTROSURGICAL) ×1 IMPLANT
GAUZE SPONGE 4X4 12PLY STRL (GAUZE/BANDAGES/DRESSINGS) ×3 IMPLANT
GLOVE BIO SURGEON STRL SZ 6.5 (GLOVE) ×2 IMPLANT
GLOVE BIO SURGEONS STRL SZ 6.5 (GLOVE) ×1
GLOVE BIOGEL PI IND STRL 9 (GLOVE) ×1 IMPLANT
GLOVE BIOGEL PI INDICATOR 9 (GLOVE) ×2
GLOVE SURG ORTHO 9.0 STRL STRW (GLOVE) ×6 IMPLANT
GOWN STRL REUS W/ TWL XL LVL3 (GOWN DISPOSABLE) ×2 IMPLANT
GOWN STRL REUS W/TWL XL LVL3 (GOWN DISPOSABLE) ×4
KIT BASIN OR (CUSTOM PROCEDURE TRAY) ×3 IMPLANT
KIT ROOM TURNOVER OR (KITS) ×3 IMPLANT
MANIFOLD NEPTUNE II (INSTRUMENTS) ×3 IMPLANT
NS IRRIG 1000ML POUR BTL (IV SOLUTION) ×3 IMPLANT
PACK GENERAL/GYN (CUSTOM PROCEDURE TRAY) ×3 IMPLANT
PAD ARMBOARD 7.5X6 YLW CONV (MISCELLANEOUS) ×6 IMPLANT
SPONGE GAUZE 4X4 12PLY STER LF (GAUZE/BANDAGES/DRESSINGS) ×3 IMPLANT
SPONGE LAP 18X18 X RAY DECT (DISPOSABLE) IMPLANT
STAPLER VISISTAT 35W (STAPLE) IMPLANT
STOCKINETTE IMPERVIOUS LG (DRAPES) ×3 IMPLANT
SUT SILK 2 0 (SUTURE) ×2
SUT SILK 2-0 18XBRD TIE 12 (SUTURE) ×1 IMPLANT
SUT VIC AB 1 CTX 27 (SUTURE) IMPLANT
TOWEL OR 17X24 6PK STRL BLUE (TOWEL DISPOSABLE) ×3 IMPLANT
TOWEL OR 17X26 10 PK STRL BLUE (TOWEL DISPOSABLE) ×3 IMPLANT
WATER STERILE IRR 1000ML POUR (IV SOLUTION) ×3 IMPLANT

## 2015-07-08 NOTE — Progress Notes (Signed)
Patient is an admit from PACU. Patient alert and oriented x 4. Patient made comfortable and was oriented to the room.

## 2015-07-08 NOTE — Anesthesia Preprocedure Evaluation (Signed)
Anesthesia Evaluation  Patient identified by MRN, date of birth, ID band Patient awake    Reviewed: Allergy & Precautions, H&P , NPO status , Patient's Chart, lab work & pertinent test results  Airway Mallampati: II  TM Distance: >3 FB Neck ROM: Full    Dental no notable dental hx. (+) Teeth Intact, Dental Advisory Given   Pulmonary Current Smoker,  breath sounds clear to auscultation  Pulmonary exam normal       Cardiovascular hypertension, Pt. on medications + CAD, + Past MI, + CABG and + Peripheral Vascular Disease Rhythm:Regular Rate:Normal     Neuro/Psych Schizophrenia negative neurological ROS     GI/Hepatic negative GI ROS, Neg liver ROS,   Endo/Other  diabetes, Type 2, Oral Hypoglycemic Agents  Renal/GU negative Renal ROS  negative genitourinary   Musculoskeletal   Abdominal   Peds  Hematology negative hematology ROS (+)   Anesthesia Other Findings   Reproductive/Obstetrics negative OB ROS                             Anesthesia Physical Anesthesia Plan  ASA: III  Anesthesia Plan: General   Post-op Pain Management:    Induction: Intravenous  Airway Management Planned: LMA  Additional Equipment:   Intra-op Plan:   Post-operative Plan: Extubation in OR  Informed Consent: I have reviewed the patients History and Physical, chart, labs and discussed the procedure including the risks, benefits and alternatives for the proposed anesthesia with the patient or authorized representative who has indicated his/her understanding and acceptance.   Dental advisory given  Plan Discussed with: CRNA  Anesthesia Plan Comments:         Anesthesia Quick Evaluation

## 2015-07-08 NOTE — Progress Notes (Signed)
Utilization review completed.  

## 2015-07-08 NOTE — Anesthesia Procedure Notes (Signed)
Procedure Name: LMA Insertion Date/Time: 07/08/2015 10:30 AM Performed by: Adonis Housekeeper Pre-anesthesia Checklist: Patient identified, Emergency Drugs available, Suction available and Patient being monitored Patient Re-evaluated:Patient Re-evaluated prior to inductionOxygen Delivery Method: Circle system utilized Preoxygenation: Pre-oxygenation with 100% oxygen Intubation Type: IV induction Ventilation: Mask ventilation without difficulty LMA: LMA inserted LMA Size: 5.0 Number of attempts: 1 Placement Confirmation: positive ETCO2 and breath sounds checked- equal and bilateral Tube secured with: Tape Dental Injury: Teeth and Oropharynx as per pre-operative assessment

## 2015-07-08 NOTE — Evaluation (Addendum)
Physical Therapy Evaluation Patient Details Name: Hector Green MRN: [id number] DOB: 1961/09/26 Today's Date: 07/08/2015   History of Present Illness  Pt is a 55 y/o M who has undergone multiple revascularizations to the RLE which have failed.  Pt is now s/p R BKA.  Pt's PMH includes CAD, DMII, PAD, MI, paranoid schizophrenia, HTN.  Clinical Impression  Patient is s/p above surgery resulting in functional limitations due to the deficits listed below (see PT Problem List). Pt very motivated to increase independence, "I don't want to become dependent on a wheelchair".  Hector Green demonstrated ability to perform sit<>stand w/ min guard assist and completed therapeutic exercises in sitting and supine.  Hector Green is a fantastic candidate for CIR and believe that he could reach Mod I level w/ additional therapy. Patient will benefit from skilled PT to increase their independence and safety with mobility to allow discharge to the venue listed below.      Follow Up Recommendations CIR    Equipment Recommendations  None recommended by PT    Recommendations for Other Services OT consult     Precautions / Restrictions Precautions Precautions: Fall Precaution Comments: Reviewed importance of maintaining R knee full extension and proper placement of pillow Restrictions Weight Bearing Restrictions: Yes RLE Weight Bearing: Non weight bearing      Mobility  Bed Mobility Overal bed mobility: Needs Assistance Bed Mobility: Supine to Sit;Sit to Supine     Supine to sit: Min guard Sit to supine: Min guard   General bed mobility comments: Min guard for safety.  Pt uses bed rails w/ increased time.  Cues for proper sequencing and technique.  Transfers Overall transfer level: Needs assistance Equipment used: Rolling walker (2 wheeled) Transfers: Sit to/from Stand Sit to Stand: Min guard         General transfer comment: Cues for hand placement and to push from bed to stand and reach back when  sitting. Pt able to stand EOB for ~2 minutes w/ min guard assist.  Politely declinced stand pivot transfer, will defer to next visit.   Ambulation/Gait                Stairs            Wheelchair Mobility    Modified Rankin (Stroke Patients Only)       Balance                                             Pertinent Vitals/Pain Pain Assessment: 0-10 Pain Score: 7  Pain Location: RLE Pain Descriptors / Indicators: Aching;Throbbing Pain Intervention(s): Limited activity within patient's tolerance;Monitored during session;Repositioned;RN gave pain meds during session    Home Living Family/patient expects to be discharged to:: Private residence Living Arrangements: Non-relatives/Friends (Two friends and their two children) Available Help at Discharge: Friend(s);Available 24 hours/day Type of Home: House Home Access: Stairs to enter Entrance Stairs-Rails: None Entrance Stairs-Number of Steps: 3 Home Layout: One level Home Equipment: Walker - 2 wheels;Cane - single point Additional Comments: Pt recently moved from [place] 3 months ago.      Prior Function Level of Independence: Independent with assistive device(s)         Comments: Hector Green almost all the time 2/2 pain     Hand Dominance        Extremity/Trunk Assessment   Upper Extremity Assessment: Defer to  OT evaluation           Lower Extremity Assessment: RLE deficits/detail RLE Deficits / Details: s/p R BKA    Cervical / Trunk Assessment: Normal  Communication   Communication: No difficulties  Cognition Arousal/Alertness: Awake/alert Behavior During Therapy: WFL for tasks assessed/performed Overall Cognitive Status: Within Functional Limits for tasks assessed                      General Comments General comments (skin integrity, edema, etc.): Pt expressed interest in quitting smoking and was provided w/ smoking cessation handout.  RN notified and pt educated  on how smoking slows down healing and reinforced that this was a good goal for him to have.    Exercises General Exercises - Lower Extremity Long Arc Quad: AROM;Right;10 reps;Seated Amputee Exercises Quad Sets: AROM;Both;10 reps;Supine Gluteal Sets: AROM;Both;15 reps;Supine Straight Leg Raises: AROM;Right;10 reps;Supine      Assessment/Plan    PT Assessment Patient needs continued PT services  PT Diagnosis Difficulty walking;Abnormality of gait;Generalized weakness;Acute pain   PT Problem List Decreased range of motion;Decreased strength;Decreased activity tolerance;Decreased balance;Decreased mobility;Decreased coordination;Decreased knowledge of use of DME;Decreased safety awareness;Decreased knowledge of precautions;Impaired sensation;Decreased skin integrity;Pain  PT Treatment Interventions DME instruction;Gait training;Stair training;Functional mobility training;Therapeutic activities;Therapeutic exercise;Balance training;Neuromuscular re-education;Patient/family education;Modalities;Wheelchair mobility training   PT Goals (Current goals can be found in the Care Plan section) Acute Rehab PT Goals Patient Stated Goal: to get stronger so he can be as indepedent as possible PT Goal Formulation: With patient Time For Goal Achievement: 07/15/15 Potential to Achieve Goals: Good    Frequency Min 5X/week   Barriers to discharge Inaccessible home environment 3 steps to enter home    Co-evaluation               End of Session Equipment Utilized During Treatment: Gait belt Activity Tolerance: Patient limited by pain;Patient limited by fatigue Patient left: in bed;with call bell/phone within reach Nurse Communication: Mobility status;Precautions;Weight bearing status;Other (comment) (Pt's desire to stop smoking )         Time: 1610-9604 PT Time Calculation (min) (ACUTE ONLY): 35 min   Charges:   PT Evaluation $Initial PT Evaluation Tier I: 1 Procedure PT  Treatments $Therapeutic Exercise: 8-22 mins   PT G Codes:       Michail Jewels PT, DPT [phone] Pager: [phone] 07/08/2015, 3:32 PM

## 2015-07-08 NOTE — Progress Notes (Signed)
Rept received from Cardell Peach RN. Pt requesting comfortably with family at bedside. No s/sx of distress and no c/o such. Will continue to monitor.

## 2015-07-08 NOTE — Transfer of Care (Signed)
Immediate Anesthesia Transfer of Care Note  Patient: Hector Green  Procedure(s) Performed: Procedure(s): AMPUTATION BELOW RIGHT KNEE (Right)  Patient Location: PACU  Anesthesia Type:General  Level of Consciousness: awake, alert  and oriented oral airway removed   Airway & Oxygen Therapy: Patient Spontanous Breathing and Patient connected to face mask oxygen  Post-op Assessment: Report given to RN, Post -op Vital signs reviewed and stable, Patient moving all extremities and Patient moving all extremities X 4  Post vital signs: Reviewed and stable  Last Vitals:  Filed Vitals:   07/08/15 0857  BP: 135/73  Pulse: 70  Temp: 36.5 C  Resp: 18    Complications: No apparent anesthesia complications

## 2015-07-08 NOTE — H&P (Signed)
Hector Green is an 54 y.o. male.   Chief Complaint: Gangrene right lower extremity HPI: Patient is a 54 year old gentleman who is undergone multiple revascularizations to the right lower extremity. Patient has had failure of the revascularizations has gangrenous changes involving the entire foot and presents at this time for transtibial amputation.  Past Medical History  Diagnosis Date  . CAD (coronary artery disease) 2011    Multivessel s/p CABG in [place]  . Type 2 diabetes mellitus   . Hyperlipidemia   . Peripheral arterial disease   . Abnormal nuclear stress test 05/06/2015  . Myocardial infarction 2011  . Paranoid schizophrenia   . Essential hypertension     not on medications at this time    Past Surgical History  Procedure Laterality Date  . Coronary artery bypass graft  2011    Three vessel by report  . Abdominal aortagram N/A 03/30/2015    Procedure: ABDOMINAL [name];  Surgeon: Fransisco Hertz, MD;  Location: [facility] CATH LAB;  Service: Cardiovascular;  Laterality: N/A;  . Lower extremity angiogram N/A 03/30/2015    Procedure: LOWER EXTREMITY ANGIOGRAM;  Surgeon: Fransisco Hertz, MD;  Location: [facility] CATH LAB;  Service: Cardiovascular;  Laterality: N/A;  . Cardiac catheterization N/A 05/06/2015    Procedure: Left Heart Cath and Cors/Grafts Angiography;  Surgeon: Tonny Bollman, MD;  Location: [facility] INVASIVE CV LAB;  Service: Cardiovascular;  Laterality: N/A;  . Femoral-tibial bypass graft Right 05/13/2015    Procedure: BYPASS GRAFT RIGHT FEMORAL-POSTERIOR TIBIAL ARTERY USING LEFT NONREVERSED TRANSLOCATED GREATER SAPPHENOUS VEIN;  Surgeon: Pryor Ochoa, MD;  Location: [facility] OR;  Service: Vascular;  Laterality: Right;  . Vein harvest Left 05/13/2015    Procedure: LEFT GREATER SAPPHENOUS VEIN HARVEST;  Surgeon: Pryor Ochoa, MD;  Location: [facility] OR;  Service: Vascular;  Laterality: Left;  . Endarterectomy femoral Right 05/13/2015    Procedure: RIGHT COMMON FEMORAL, SUPERFICIAL FEMORAL AND PROFUNDA  ENDARTERECTOMY ;  Surgeon: Pryor Ochoa, MD;  Location: [facility] OR;  Service: Vascular;  Laterality: Right;  . Patch angioplasty Right 05/13/2015    Procedure: RIGHT FEMORAL ARTERY PATCH ANGIOPLASTY;  Surgeon: Pryor Ochoa, MD;  Location: [facility] OR;  Service: Vascular;  Laterality: Right;  . Intraoperative arteriogram Right 05/13/2015    Procedure: RIGHT LOWER LEG INTRA OPERATIVE ARTERIOGRAM;  Surgeon: Pryor Ochoa, MD;  Location: [facility] OR;  Service: Vascular;  Laterality: Right;  . Thrombectomy femoral artery Right 05/13/2015    Procedure: THROMBECTOMY RIGHT FEMORAL-PROXIMAL POSTERIOR TIBAIL ARTERY BYPASS GRAFT;  Surgeon: Larina Earthly, MD;  Location: [facility] OR;  Service: Vascular;  Laterality: Right;  . Colonoscopy    . Femoral-tibial bypass graft Right 05/27/2015    Procedure: THROMBECTOMY OF RIGHT FEMORAL-POSTERIOR TIBIAL ARTERY SAPHENOUS VEIN BYPASS GRAFT ;  Surgeon: Pryor Ochoa, MD;  Location: [facility] OR;  Service: Vascular;  Laterality: Right;  . Intraoperative arteriogram Right 05/27/2015    Procedure: INTRA OPERATIVE ARTERIOGRAM;  Surgeon: Pryor Ochoa, MD;  Location: [facility] OR;  Service: Vascular;  Laterality: Right;    Family History  Problem Relation Age of Onset  . Cancer Father    Social History:  reports that he has been smoking Cigarettes.  He started smoking about 39 years ago. He has a 7.5 pack-year smoking history. He has never used smokeless tobacco. He reports that he does not drink alcohol or use illicit drugs.  Allergies:  Allergies  Allergen Reactions  . Fish-Derived Products Anaphylaxis  . Penicillins Anaphylaxis  No prescriptions prior to admission    No results found for this or any previous visit (from the past 48 hour(s)). No results found.  Review of Systems  All other systems reviewed and are negative.   There were no vitals taken for this visit. Physical Exam  On examination patient has decreased turgor and the entire foot the heel pad is tender to palpation he has  black gangrenous changes of the forefoot. Assessment/Plan Assessment: Gangrene right foot status post failed revascularization attempts for foot salvage.  Plan: We'll plan for transtibial amputation. I discussed with the patient and his brother on the phone yesterday that patient may even have a hard time healing a transtibial amputation. I feel this is the best way to maintain the patient's function and we will proceed with a transtibial amputation.  Mishelle Hassan V 07/08/2015, 6:45 AM

## 2015-07-08 NOTE — Anesthesia Postprocedure Evaluation (Signed)
  Anesthesia Post-op Note  Patient: Hector Green  Procedure(s) Performed: Procedure(s): AMPUTATION BELOW RIGHT KNEE (Right)  Patient Location: PACU  Anesthesia Type:General  Level of Consciousness: awake and alert   Airway and Oxygen Therapy: Patient Spontanous Breathing  Post-op Pain: Controlled  Post-op Assessment: Post-op Vital signs reviewed, Patient's Cardiovascular Status Stable and Respiratory Function Stable  Post-op Vital Signs: Reviewed  Filed Vitals:   07/08/15 1300  BP:   Pulse: 88  Temp: 36.6 C  Resp: 16    Complications: No apparent anesthesia complications

## 2015-07-08 NOTE — Op Note (Signed)
   Date of Surgery: 07/08/2015  INDICATIONS: Mr. Gatt is a 54 y.o.-year-old male who has undergone revascularization to the right lower extremity  and has an ischemic right foot.  PREOPERATIVE DIAGNOSIS: Gangrene right foot  POSTOPERATIVE DIAGNOSIS: Same.  PROCEDURE: Transtibial amputation  SURGEON: Lajoyce Corners, M.D.  ANESTHESIA:  general  IV FLUIDS AND URINE: See anesthesia.  ESTIMATED BLOOD LOSS: Minimal mL.  COMPLICATIONS: None.  DESCRIPTION OF PROCEDURE: The patient was brought to the operating room and underwent a general anesthetic. After adequate levels of anesthesia were obtained patient's lower extremity was prepped using DuraPrep draped into a sterile field. A timeout was called.  A transverse incision was made 11 cm distal to the tibial tubercle. This curved proximally and a large posterior flap was created. The tibia was transected 1 cm proximal to the skin incision. The fibula was transected just proximal to the tibial incision. The tibia was beveled anteriorly. A large posterior flap was created. The sciatic nerve was pulled cut and allowed to retract. The vascular bundles were suture ligated with 2-0 silk. The deep and superficial fascial layers were closed using #1 Vicryl. The skin was closed using staples and 2-0 nylon. The wound was covered with Adaptic orthopedic sponges AB dressing Kerlix and Coban. Patient was extubated taken to the PACU in stable condition.  Aldean Baker, MD [facility] 11:08 AM

## 2015-07-08 NOTE — Progress Notes (Signed)
Rehab Admissions Coordinator Note:  Patient was screened by Trish Mage for appropriateness for an Inpatient Acute Rehab Consult.  Noted PT recommending CIR.  At this time, we are recommending Inpatient Rehab consult.  However, patient may continue to do well and may not need an inpatient rehab admission since on evaluation he is at min guard assist for transfers.  Trish Mage 07/08/2015, 3:57 PM  I can be reached at [phone].

## 2015-07-09 ENCOUNTER — Encounter (HOSPITAL_COMMUNITY): Payer: Self-pay | Admitting: Orthopedic Surgery

## 2015-07-09 LAB — GLUCOSE, CAPILLARY
GLUCOSE-CAPILLARY: 80 mg/dL (ref 65–99)
Glucose-Capillary: 155 mg/dL — ABNORMAL HIGH (ref 65–99)
Glucose-Capillary: 230 mg/dL — ABNORMAL HIGH (ref 65–99)
Glucose-Capillary: 84 mg/dL (ref 65–99)

## 2015-07-09 LAB — PROTIME-INR
INR: 1 (ref 0.00–1.49)
Prothrombin Time: 13.4 seconds (ref 11.6–15.2)

## 2015-07-09 MED ORDER — PATIENT'S GUIDE TO USING COUMADIN BOOK
Freq: Once | Status: AC
Start: 2015-07-09 — End: 2015-07-09
  Administered 2015-07-09: 17:00:00
  Filled 2015-07-09: qty 1

## 2015-07-09 NOTE — Progress Notes (Signed)
Physical Therapy Treatment Patient Details Name: Hector Green MRN: [id number] DOB: 07-14-1961 Today's Date: 07/09/2015    History of Present Illness Pt is a 54 y/o M who has undergone multiple revascularizations to the RLE which have failed.  Pt is now s/p R BKA.  Pt's PMH includes CAD, DMII, PAD, MI, paranoid schizophrenia, HTN.    PT Comments    Pt is doing well with ambulation. Spoke with pt and he wanted to D/C home. Has 24/7 supervision at his brothers house where he will be staying. Updated POC. Will need to practice steps before D/C home. Notified case manager for updated recommendations.   Follow Up Recommendations  Home health PT;Supervision/Assistance - 24 hour     Equipment Recommendations  None recommended by PT;Wheelchair (measurements PT);Wheelchair cushion (measurements PT) (Amputee board.)    Recommendations for Other Services       Precautions / Restrictions Precautions Precautions: Fall Precaution Comments: Reviewed importance of maintaining R knee full extension and proper placement of pillow Restrictions RLE Weight Bearing: Non weight bearing    Mobility  Bed Mobility Overal bed mobility: Modified Independent Bed Mobility: Supine to Sit;Sit to Supine     Supine to sit: Min guard Sit to supine: Min guard   General bed mobility comments: Min guard for safety.  Pt uses bed rails w/ increased time.  Cues for proper sequencing and technique.  Transfers Overall transfer level: Needs assistance Equipment used: Rolling walker (2 wheeled) Transfers: Sit to/from Stand Sit to Stand: Supervision         General transfer comment: Supervision for safety.  Ambulation/Gait Ambulation/Gait assistance: Min guard Ambulation Distance (Feet): 75 Feet Assistive device: Rolling walker (2 wheeled) Gait Pattern/deviations: Step-to pattern;Trunk flexed   Gait velocity interpretation: Below normal speed for age/gender General Gait Details: Cues for safety with  RW.   Stairs            Wheelchair Mobility    Modified Rankin (Stroke Patients Only)       Balance                                    Cognition Arousal/Alertness: Awake/alert Behavior During Therapy: WFL for tasks assessed/performed Overall Cognitive Status: Within Functional Limits for tasks assessed                      Exercises Total Joint Exercises Quad Sets: Strengthening;Seated;10 reps;Right Heel Slides: AROM;Right;10 reps;Seated Hip ABduction/ADduction: AROM;Right;10 reps;Seated Straight Leg Raises: AROM;Right;10 reps;Seated    General Comments        Pertinent Vitals/Pain Pain Score: 6  Pain Location: RLE Pain Descriptors / Indicators: Sore Pain Intervention(s): Monitored during session;Repositioned    Home Living Family/patient expects to be discharged to:: Private residence Living Arrangements: Non-relatives/Friends (Two friends and their two children) Available Help at Discharge: Friend(s);Available 24 hours/day Type of Home: House Home Access: Stairs to enter Entrance Stairs-Rails: None Home Layout: One level Home Equipment: Environmental consultant - 2 wheels;Cane - single point Additional Comments: Pt recently moved from [place] 3 months ago.      Prior Function Level of Independence: Independent with assistive device(s)      Comments: Gilmer Mor almost all the time 2/2 pain   PT Goals (current goals can now be found in the care plan section) Acute Rehab PT Goals Patient Stated Goal: to get stronger so he can be as indepedent as possible Progress towards PT  goals: Progressing toward goals    Frequency  Min 5X/week    PT Plan Discharge plan needs to be updated    Co-evaluation             End of Session Equipment Utilized During Treatment: Gait belt Activity Tolerance: Patient tolerated treatment well Patient left: in chair;with call bell/phone within reach     Time: 1310-1341 PT Time Calculation (min) (ACUTE  ONLY): 31 min  Charges:                       G CodesNita Sells, SPTA 24-Jul-2015, 1:53 PM

## 2015-07-09 NOTE — Progress Notes (Signed)
Physical medicine rehabilitation consult requested chart reviewed. Patient doing very well after BKA and requiring minimal assist to ambulate 75 feet with a rolling walker. Patient is requesting discharge to home in therapies requested home health therapies. Patient has good support at home. Hold on formal rehabilitation consult at this time and discharge to home with home therapies

## 2015-07-09 NOTE — Progress Notes (Signed)
Patient ID: Hector Green, male   DOB: [birth year], 54 y.o.   MRN: [id number] Postoperative day 1 right transtibial amputation. Patient may require discharge to skilled nursing. Physical therapy to evaluate for discharge to home versus skilled care.

## 2015-07-09 NOTE — Evaluation (Signed)
Occupational Therapy Evaluation Patient Details Name: Hector Green MRN: [id number] DOB: 03-04-1961 Today's Date: 07/09/2015    History of Present Illness Pt is a 54 y/o M who has undergone multiple revascularizations to the RLE which have failed.  Pt is now s/p R BKA.  Pt's PMH includes CAD, DMII, PAD, MI, paranoid schizophrenia, HTN.   Clinical Impression   Pt admitted for R BKA. Pt currently with functional limitations due to the deficits listed below (see OT Problem List).  Pt will benefit from skilled OT to increase their safety and independence with ADL and functional mobility for ADL to facilitate discharge to venue listed below.      Follow Up Recommendations  CIR    Equipment Recommendations  None recommended by OT       Precautions / Restrictions Precautions Precautions: Fall Precaution Comments: Reviewed importance of maintaining R knee full extension and proper placement of pillow Restrictions Weight Bearing Restrictions: Yes RLE Weight Bearing: Non weight bearing      Mobility Bed Mobility Overal bed mobility: Needs Assistance Bed Mobility: Supine to Sit;Sit to Supine     Supine to sit: Min guard Sit to supine: Min guard   General bed mobility comments: Min guard for safety.  Pt uses bed rails w/ increased time.  Cues for proper sequencing and technique.  Transfers                 General transfer comment: NT due to pain         ADL Overall ADL's : Needs assistance/impaired     Grooming: Sitting;Set up   Upper Body Bathing: Set up;Sitting   Lower Body Bathing: Maximal assistance;Sitting/lateral leans   Upper Body Dressing : Set up;Sitting   Lower Body Dressing: Sitting/lateral leans;Maximal assistance                 General ADL Comments: pain increaed greatly sitting EOB- RN called               Pertinent Vitals/Pain Pain Score: 9  Pain Location: RLE Pain Descriptors / Indicators: Sore;Throbbing Pain Intervention(s):  Limited activity within patient's tolerance;Monitored during session;Repositioned;Patient requesting pain meds-RN notified;RN gave pain meds during session        Extremity/Trunk Assessment Upper Extremity Assessment Upper Extremity Assessment: Overall WFL for tasks assessed           Communication Communication Communication: No difficulties   Cognition Arousal/Alertness: Awake/alert Behavior During Therapy: WFL for tasks assessed/performed Overall Cognitive Status: Within Functional Limits for tasks assessed                                Home Living Family/patient expects to be discharged to:: Private residence Living Arrangements: Non-relatives/Friends (Two friends and their two children) Available Help at Discharge: Friend(s);Available 24 hours/day Type of Home: House Home Access: Stairs to enter [employer] of Steps: 3 Entrance Stairs-Rails: None Home Layout: One level     Bathroom Shower/Tub: Tub/shower unit;Curtain   Firefighter: Standard     Home Equipment: Environmental consultant - 2 wheels;Cane - single point   Additional Comments: Pt recently moved from [place] 3 months ago.        Prior Functioning/Environment Level of Independence: Independent with assistive device(s)        Comments: Gilmer Mor almost all the time 2/2 pain    OT Diagnosis: Generalized weakness;Acute pain   OT Problem List: Decreased strength;Pain;Impaired balance (sitting and/or  standing);Decreased knowledge of use of DME or AE   OT Treatment/Interventions: Self-care/ADL training;DME and/or AE instruction;Patient/family education    OT Goals(Current goals can be found in the care plan section) Acute Rehab OT Goals Patient Stated Goal: to get stronger so he can be as indepedent as possible OT Goal Formulation: With patient Time For Goal Achievement: 07/23/15 Potential to Achieve Goals: Good ADL Goals Pt Will Perform Grooming: with supervision;standing Pt Will  Perform Lower Body Dressing: with supervision;sit to/from stand;sitting/lateral leans Pt Will Transfer to Toilet: with supervision;regular height toilet;ambulating;bedside commode Pt Will Perform Toileting - Clothing Manipulation and hygiene: with supervision;sit to/from stand;sitting/lateral leans  OT Frequency: Min 2X/week   Barriers to D/C:               End of Session Nurse Communication: Mobility status  Activity Tolerance: Patient limited by pain Patient left: in bed;with call bell/phone within reach   Time: 1048-1105 OT Time Calculation (min): 17 min Charges:  OT General Charges $OT Visit: 1 Procedure OT Evaluation $Initial OT Evaluation Tier I: 1 Procedure G-Codes:    Alba Cory 22-Jul-2015, 11:12 AM

## 2015-07-10 LAB — PROTIME-INR
INR: 1.07 (ref 0.00–1.49)
Prothrombin Time: 14.1 seconds (ref 11.6–15.2)

## 2015-07-10 LAB — GLUCOSE, CAPILLARY: Glucose-Capillary: 106 mg/dL — ABNORMAL HIGH (ref 65–99)

## 2015-07-10 MED ORDER — OXYCODONE-ACETAMINOPHEN 5-325 MG PO TABS: 1.0000 | ORAL_TABLET | ORAL | Status: DC | PRN

## 2015-07-10 NOTE — Care Management Note (Signed)
Case Management Note  Patient Details  Name: KALONJI ZURAWSKI MRN: [id number] Date of Birth: 1961-12-10  Subjective/Objective:      S/p right BKA              Action/Plan: Spoke with patient about HHC, he chose [facility]. Contacted [facility] at [facility] and set up HHPT and HHOT. Wheelchair with amputee pad delivered to room by [facility]. Patient stated that he will be staying with his brother after discharge.   Expected Discharge Date:                  Expected Discharge Plan:  Home w Home Health Services  In-House Referral:  NA  Discharge planning Services  CM Consult  Post Acute Care Choice:  Home Health, Durable Medical Equipment Choice offered to:     DME Arranged:  Wheelchair manual DME Agency:  [facility]  HH Arranged:  PT, OT [facility] Agency:  [facility]  Status of Service:  Completed, signed off  Medicare Important Message Given:    Date Medicare IM Given:    Medicare IM give by:    Date Additional Medicare IM Given:    Additional Medicare Important Message give by:     If discussed at Long Length of Stay Meetings, dates discussed:    Additional Comments:  Monica Becton, RN 07/10/2015, 11:52 AM

## 2015-07-10 NOTE — Progress Notes (Signed)
Occupational Therapy Treatment Patient Details Name: JAEDAN HUTTNER MRN: [id number] DOB: [birth year] Today's Date: 07/10/2015    History of present illness Pt is a 54 y/o M who has undergone multiple revascularizations to the RLE which have failed.  Pt is now s/p R BKA.  Pt's PMH includes CAD, DMII, PAD, MI, paranoid schizophrenia, HTN.   OT comments  Pt. Progressing well with acute stated goals.  Able to complete LB ADLS and toileting this session.  Eager for d/c home.    Follow Up Recommendations  CIR    Equipment Recommendations  None recommended by OT    Recommendations for Other Services      Precautions / Restrictions Precautions Precautions: Fall Restrictions RLE Weight Bearing: Non weight bearing       Mobility Bed Mobility Overal bed mobility: Modified Independent                Transfers Overall transfer level: Needs assistance Equipment used: Rolling walker (2 wheeled) Transfers: Sit to/from Stand Sit to Stand: Supervision              Balance                                   ADL Overall ADL's : Needs assistance/impaired     Grooming: Wash/dry hands;Min guard;Standing           Upper Body Dressing : Set up;Sitting   Lower Body Dressing: Min guard;Sit to/from stand   Toilet Transfer: Min guard;RW;Cueing for safety;Cueing for sequencing   Toileting- Architect and Hygiene: Min guard;Sit to/from stand       Functional mobility during ADLs: Supervision/safety;Rolling walker General ADL Comments: educated on benefits of shoes with support and a back, pt. prefers his slip on flip flops with socks.  discussed bathing.  reports a tub/shower combination.  able to complete LB ADLS sit/stand, also urinated in standing but reports he will also utilize urinal as needed      Vision                     Perception     Praxis      Cognition   Behavior During Therapy: [facility] for tasks assessed/performed Overall  Cognitive Status: Within Functional Limits for tasks assessed                       Extremity/Trunk Assessment               Exercises     Shoulder Instructions       General Comments      Pertinent Vitals/ Pain       Pain Assessment: No/denies pain  Home Living                                          Prior Functioning/Environment              Frequency Min 2X/week     Progress Toward Goals  OT Goals(current goals can now be found in the care plan section)  Progress towards OT goals: Progressing toward goals     Plan Discharge plan remains appropriate    Co-evaluation                 End of Session Equipment Utilized During  Treatment: Gait belt;Rolling walker   Activity Tolerance Patient tolerated treatment well   Patient Left in chair;with call bell/phone within reach   Nurse Communication          Time: 1191-4782 OT Time Calculation (min): 13 min  Charges: OT General Charges $OT Visit: 1 Procedure OT Treatments $Self Care/Home Management : 8-22 mins  Robet Leu, COTA/L 07/10/2015, 9:55 AM

## 2015-07-10 NOTE — Discharge Summary (Signed)
Physician Discharge Summary  Patient ID: Hector Green MRN: [id number] DOB/AGE: 53/03/1961 53 y.o.  Admit date: 07/08/2015 Discharge date: 07/10/2015  Admission Diagnoses: Gangrene right foot  Discharge Diagnoses: Right transtibial amputation Active Problems:   Below knee amputation status   Discharged Condition: stable  Hospital Course: Patient's hospital course was essentially unremarkable. He underwent transtibial amputation progressed well with therapy and was discharged to home.  Consults: None  Significant Diagnostic Studies: labs: Routine labs  Treatments: surgery: See operative note  Discharge Exam: Blood pressure 134/76, pulse 71, temperature 99.3 F (37.4 C), temperature source Oral, resp. rate 18, weight 78.019 kg (172 lb), SpO2 100 %. Incision/Wound: clean and dry  Disposition: 01-Home or Self Care  Discharge Instructions    Call MD / Call 911    Complete by:  As directed   If you experience chest pain or shortness of breath, CALL 911 and be transported to the hospital emergency room.  If you develope a fever above 101 F, pus (white drainage) or increased drainage or redness at the wound, or calf pain, call your surgeon's office.     Constipation Prevention    Complete by:  As directed   Drink plenty of fluids.  Prune juice may be helpful.  You may use a stool softener, such as Colace (over the counter) 100 mg twice a day.  Use MiraLax (over the counter) for constipation as needed.     Diet - low sodium heart healthy    Complete by:  As directed      Increase activity slowly as tolerated    Complete by:  As directed             Medication List    TAKE these medications        aspirin EC 81 MG tablet  Take 81 mg by mouth daily.     atorvastatin 80 MG tablet  Commonly known as:  LIPITOR  Take 1 tablet (80 mg total) by mouth daily.     glimepiride 4 MG tablet  Commonly known as:  AMARYL  Take 1 tablet (4 mg total) by mouth daily before breakfast.     glucose blood test strip  One Touch Verio.  Use to check BG up to bid.  Dx:  E11.8 (incontrolled type 2 DM)     ibuprofen 200 MG tablet  Commonly known as:  ADVIL,MOTRIN  Take 800 mg by mouth every 6 (six) hours as needed for moderate pain.     levofloxacin 750 MG tablet  Commonly known as:  LEVAQUIN  Take 1 tablet (750 mg total) by mouth daily.     metFORMIN 1000 MG tablet  Commonly known as:  GLUCOPHAGE  Take 1,000 mg by mouth 2 (two) times daily with a meal.     ONETOUCH DELICA LANCETS 33G Misc  Use to check BG up to bid.  Dx: E11.8 (uncontrolled type 2 DM)     Oxycodone HCl 10 MG Tabs  Take 1 tablet (10 mg total) by mouth every 6 (six) hours as needed.     oxyCODONE-acetaminophen 5-325 MG per tablet  Commonly known as:  ROXICET  Take 1 tablet by mouth every 4 (four) hours as needed for severe pain.     warfarin 5 MG tablet  Commonly known as:  COUMADIN  Take 1 tablet (5 mg total) by mouth daily.           Follow-up Information    Follow up with [facility].  Why:  They will contact you to schedule home therapy visits.   Contact information:   [address] [place] [address] [phone]       Follow up with DUDA,MARCUS V, MD In 2 weeks.   Specialty:  Orthopedic Surgery   Contact information:   [address] [place] [address] [phone]       Signed: Nadara Mustard 07/10/2015, 7:02 AM

## 2015-07-10 NOTE — Progress Notes (Signed)
Patient ID: Hector Green, male   DOB: [birth year], 54 y.o.   MRN: [id number] Patient felt to be ambulating independently for discharge to home by therapy. Inpatient rehabilitation does not feel patient needs their services. We'll plan for discharge to home.

## 2015-07-11 DIAGNOSIS — E119 Type 2 diabetes mellitus without complications: Secondary | ICD-10-CM | POA: Diagnosis not present

## 2015-07-11 DIAGNOSIS — F200 Paranoid schizophrenia: Secondary | ICD-10-CM | POA: Diagnosis not present

## 2015-07-11 DIAGNOSIS — Z951 Presence of aortocoronary bypass graft: Secondary | ICD-10-CM | POA: Diagnosis not present

## 2015-07-11 DIAGNOSIS — Z89511 Acquired absence of right leg below knee: Secondary | ICD-10-CM | POA: Diagnosis not present

## 2015-07-11 DIAGNOSIS — I2510 Atherosclerotic heart disease of native coronary artery without angina pectoris: Secondary | ICD-10-CM | POA: Diagnosis not present

## 2015-07-11 DIAGNOSIS — I252 Old myocardial infarction: Secondary | ICD-10-CM | POA: Diagnosis not present

## 2015-07-11 DIAGNOSIS — Z4781 Encounter for orthopedic aftercare following surgical amputation: Secondary | ICD-10-CM | POA: Diagnosis not present

## 2015-07-11 DIAGNOSIS — F17210 Nicotine dependence, cigarettes, uncomplicated: Secondary | ICD-10-CM | POA: Diagnosis not present

## 2015-07-11 DIAGNOSIS — I10 Essential (primary) hypertension: Secondary | ICD-10-CM | POA: Diagnosis not present

## 2015-07-11 DIAGNOSIS — Z7901 Long term (current) use of anticoagulants: Secondary | ICD-10-CM | POA: Diagnosis not present

## 2015-07-11 DIAGNOSIS — I739 Peripheral vascular disease, unspecified: Secondary | ICD-10-CM | POA: Diagnosis not present

## 2015-07-13 DIAGNOSIS — Z4781 Encounter for orthopedic aftercare following surgical amputation: Secondary | ICD-10-CM | POA: Diagnosis not present

## 2015-07-13 DIAGNOSIS — I739 Peripheral vascular disease, unspecified: Secondary | ICD-10-CM | POA: Diagnosis not present

## 2015-07-13 DIAGNOSIS — Z89511 Acquired absence of right leg below knee: Secondary | ICD-10-CM | POA: Diagnosis not present

## 2015-07-13 DIAGNOSIS — I10 Essential (primary) hypertension: Secondary | ICD-10-CM | POA: Diagnosis not present

## 2015-07-13 DIAGNOSIS — E119 Type 2 diabetes mellitus without complications: Secondary | ICD-10-CM | POA: Diagnosis not present

## 2015-07-13 DIAGNOSIS — I2510 Atherosclerotic heart disease of native coronary artery without angina pectoris: Secondary | ICD-10-CM | POA: Diagnosis not present

## 2015-07-14 DIAGNOSIS — Z89511 Acquired absence of right leg below knee: Secondary | ICD-10-CM | POA: Diagnosis not present

## 2015-07-14 DIAGNOSIS — I739 Peripheral vascular disease, unspecified: Secondary | ICD-10-CM | POA: Diagnosis not present

## 2015-07-14 DIAGNOSIS — I10 Essential (primary) hypertension: Secondary | ICD-10-CM | POA: Diagnosis not present

## 2015-07-14 DIAGNOSIS — Z4781 Encounter for orthopedic aftercare following surgical amputation: Secondary | ICD-10-CM | POA: Diagnosis not present

## 2015-07-14 DIAGNOSIS — I2510 Atherosclerotic heart disease of native coronary artery without angina pectoris: Secondary | ICD-10-CM | POA: Diagnosis not present

## 2015-07-14 DIAGNOSIS — E119 Type 2 diabetes mellitus without complications: Secondary | ICD-10-CM | POA: Diagnosis not present

## 2015-07-15 DIAGNOSIS — I2510 Atherosclerotic heart disease of native coronary artery without angina pectoris: Secondary | ICD-10-CM | POA: Diagnosis not present

## 2015-07-15 DIAGNOSIS — Z89511 Acquired absence of right leg below knee: Secondary | ICD-10-CM | POA: Diagnosis not present

## 2015-07-15 DIAGNOSIS — E119 Type 2 diabetes mellitus without complications: Secondary | ICD-10-CM | POA: Diagnosis not present

## 2015-07-15 DIAGNOSIS — I10 Essential (primary) hypertension: Secondary | ICD-10-CM | POA: Diagnosis not present

## 2015-07-15 DIAGNOSIS — Z4781 Encounter for orthopedic aftercare following surgical amputation: Secondary | ICD-10-CM | POA: Diagnosis not present

## 2015-07-15 DIAGNOSIS — I739 Peripheral vascular disease, unspecified: Secondary | ICD-10-CM | POA: Diagnosis not present

## 2015-07-16 DIAGNOSIS — Z89511 Acquired absence of right leg below knee: Secondary | ICD-10-CM | POA: Diagnosis not present

## 2015-07-16 DIAGNOSIS — I10 Essential (primary) hypertension: Secondary | ICD-10-CM | POA: Diagnosis not present

## 2015-07-16 DIAGNOSIS — I2510 Atherosclerotic heart disease of native coronary artery without angina pectoris: Secondary | ICD-10-CM | POA: Diagnosis not present

## 2015-07-16 DIAGNOSIS — I739 Peripheral vascular disease, unspecified: Secondary | ICD-10-CM | POA: Diagnosis not present

## 2015-07-16 DIAGNOSIS — Z4781 Encounter for orthopedic aftercare following surgical amputation: Secondary | ICD-10-CM | POA: Diagnosis not present

## 2015-07-16 DIAGNOSIS — E119 Type 2 diabetes mellitus without complications: Secondary | ICD-10-CM | POA: Diagnosis not present

## 2015-07-17 DIAGNOSIS — I2510 Atherosclerotic heart disease of native coronary artery without angina pectoris: Secondary | ICD-10-CM | POA: Diagnosis not present

## 2015-07-17 DIAGNOSIS — I10 Essential (primary) hypertension: Secondary | ICD-10-CM | POA: Diagnosis not present

## 2015-07-17 DIAGNOSIS — Z89511 Acquired absence of right leg below knee: Secondary | ICD-10-CM | POA: Diagnosis not present

## 2015-07-17 DIAGNOSIS — I739 Peripheral vascular disease, unspecified: Secondary | ICD-10-CM | POA: Diagnosis not present

## 2015-07-17 DIAGNOSIS — Z4781 Encounter for orthopedic aftercare following surgical amputation: Secondary | ICD-10-CM | POA: Diagnosis not present

## 2015-07-17 DIAGNOSIS — E119 Type 2 diabetes mellitus without complications: Secondary | ICD-10-CM | POA: Diagnosis not present

## 2015-07-20 DIAGNOSIS — Z89511 Acquired absence of right leg below knee: Secondary | ICD-10-CM | POA: Diagnosis not present

## 2015-07-20 DIAGNOSIS — Z4781 Encounter for orthopedic aftercare following surgical amputation: Secondary | ICD-10-CM | POA: Diagnosis not present

## 2015-07-20 DIAGNOSIS — I10 Essential (primary) hypertension: Secondary | ICD-10-CM | POA: Diagnosis not present

## 2015-07-20 DIAGNOSIS — I739 Peripheral vascular disease, unspecified: Secondary | ICD-10-CM | POA: Diagnosis not present

## 2015-07-20 DIAGNOSIS — I2510 Atherosclerotic heart disease of native coronary artery without angina pectoris: Secondary | ICD-10-CM | POA: Diagnosis not present

## 2015-07-20 DIAGNOSIS — E119 Type 2 diabetes mellitus without complications: Secondary | ICD-10-CM | POA: Diagnosis not present

## 2015-07-22 DIAGNOSIS — E119 Type 2 diabetes mellitus without complications: Secondary | ICD-10-CM | POA: Diagnosis not present

## 2015-07-22 DIAGNOSIS — I739 Peripheral vascular disease, unspecified: Secondary | ICD-10-CM | POA: Diagnosis not present

## 2015-07-22 DIAGNOSIS — I2510 Atherosclerotic heart disease of native coronary artery without angina pectoris: Secondary | ICD-10-CM | POA: Diagnosis not present

## 2015-07-22 DIAGNOSIS — I10 Essential (primary) hypertension: Secondary | ICD-10-CM | POA: Diagnosis not present

## 2015-07-22 DIAGNOSIS — Z89511 Acquired absence of right leg below knee: Secondary | ICD-10-CM | POA: Diagnosis not present

## 2015-07-22 DIAGNOSIS — Z4781 Encounter for orthopedic aftercare following surgical amputation: Secondary | ICD-10-CM | POA: Diagnosis not present

## 2015-07-27 DIAGNOSIS — I739 Peripheral vascular disease, unspecified: Secondary | ICD-10-CM | POA: Diagnosis not present

## 2015-07-27 DIAGNOSIS — Z89511 Acquired absence of right leg below knee: Secondary | ICD-10-CM | POA: Diagnosis not present

## 2015-07-27 DIAGNOSIS — I2510 Atherosclerotic heart disease of native coronary artery without angina pectoris: Secondary | ICD-10-CM | POA: Diagnosis not present

## 2015-07-27 DIAGNOSIS — I10 Essential (primary) hypertension: Secondary | ICD-10-CM | POA: Diagnosis not present

## 2015-07-27 DIAGNOSIS — Z4781 Encounter for orthopedic aftercare following surgical amputation: Secondary | ICD-10-CM | POA: Diagnosis not present

## 2015-07-27 DIAGNOSIS — E119 Type 2 diabetes mellitus without complications: Secondary | ICD-10-CM | POA: Diagnosis not present

## 2015-07-29 DIAGNOSIS — I10 Essential (primary) hypertension: Secondary | ICD-10-CM | POA: Diagnosis not present

## 2015-07-29 DIAGNOSIS — Z89511 Acquired absence of right leg below knee: Secondary | ICD-10-CM | POA: Diagnosis not present

## 2015-07-29 DIAGNOSIS — E119 Type 2 diabetes mellitus without complications: Secondary | ICD-10-CM | POA: Diagnosis not present

## 2015-07-29 DIAGNOSIS — Z4781 Encounter for orthopedic aftercare following surgical amputation: Secondary | ICD-10-CM | POA: Diagnosis not present

## 2015-07-29 DIAGNOSIS — I739 Peripheral vascular disease, unspecified: Secondary | ICD-10-CM | POA: Diagnosis not present

## 2015-07-29 DIAGNOSIS — I2510 Atherosclerotic heart disease of native coronary artery without angina pectoris: Secondary | ICD-10-CM | POA: Diagnosis not present

## 2015-07-30 ENCOUNTER — Telehealth: Payer: Self-pay | Admitting: Pharmacist

## 2015-07-30 DIAGNOSIS — E119 Type 2 diabetes mellitus without complications: Secondary | ICD-10-CM | POA: Diagnosis not present

## 2015-07-30 DIAGNOSIS — Z89511 Acquired absence of right leg below knee: Secondary | ICD-10-CM | POA: Diagnosis not present

## 2015-07-30 DIAGNOSIS — Z4781 Encounter for orthopedic aftercare following surgical amputation: Secondary | ICD-10-CM | POA: Diagnosis not present

## 2015-07-30 DIAGNOSIS — I10 Essential (primary) hypertension: Secondary | ICD-10-CM | POA: Diagnosis not present

## 2015-07-30 DIAGNOSIS — I2510 Atherosclerotic heart disease of native coronary artery without angina pectoris: Secondary | ICD-10-CM | POA: Diagnosis not present

## 2015-07-30 DIAGNOSIS — I739 Peripheral vascular disease, unspecified: Secondary | ICD-10-CM | POA: Diagnosis not present

## 2015-07-30 NOTE — Telephone Encounter (Signed)
Called patient to check on him post surgery / amputation.  He states that he is doing well.  He has restarted warfarin.  Reports that [facility] is coming out of PT but they have not checked INR that he is aware of.  I will send note to our [facility] Dept and see if we can order INR check for next home visit.

## 2015-07-30 NOTE — Telephone Encounter (Signed)
Noted.  OK to check 08/05/2015

## 2015-07-30 NOTE — Telephone Encounter (Signed)
Ordered protime, their next visit is 08/05/15.

## 2015-08-03 ENCOUNTER — Telehealth: Payer: Self-pay | Admitting: *Deleted

## 2015-08-03 ENCOUNTER — Ambulatory Visit (INDEPENDENT_AMBULATORY_CARE_PROVIDER_SITE_OTHER): Payer: Medicare Other | Admitting: Pharmacist

## 2015-08-03 DIAGNOSIS — Z4781 Encounter for orthopedic aftercare following surgical amputation: Secondary | ICD-10-CM | POA: Diagnosis not present

## 2015-08-03 DIAGNOSIS — E119 Type 2 diabetes mellitus without complications: Secondary | ICD-10-CM | POA: Diagnosis not present

## 2015-08-03 DIAGNOSIS — I739 Peripheral vascular disease, unspecified: Secondary | ICD-10-CM | POA: Diagnosis not present

## 2015-08-03 DIAGNOSIS — I10 Essential (primary) hypertension: Secondary | ICD-10-CM | POA: Diagnosis not present

## 2015-08-03 DIAGNOSIS — Z89511 Acquired absence of right leg below knee: Secondary | ICD-10-CM | POA: Diagnosis not present

## 2015-08-03 DIAGNOSIS — I2510 Atherosclerotic heart disease of native coronary artery without angina pectoris: Secondary | ICD-10-CM | POA: Diagnosis not present

## 2015-08-03 LAB — POCT INR: INR: 1

## 2015-08-03 NOTE — Telephone Encounter (Signed)
INR 1.0  Pt takes 5 mg qd

## 2015-08-05 NOTE — Telephone Encounter (Signed)
Patient spoke with by Henrene Pastor and home health notified. Please see anti coag visit for same day.

## 2015-08-07 ENCOUNTER — Ambulatory Visit: Payer: Self-pay | Admitting: Pharmacist

## 2015-08-07 DIAGNOSIS — Z89511 Acquired absence of right leg below knee: Secondary | ICD-10-CM | POA: Diagnosis not present

## 2015-08-07 DIAGNOSIS — I10 Essential (primary) hypertension: Secondary | ICD-10-CM | POA: Diagnosis not present

## 2015-08-07 DIAGNOSIS — I739 Peripheral vascular disease, unspecified: Secondary | ICD-10-CM

## 2015-08-07 DIAGNOSIS — Z4781 Encounter for orthopedic aftercare following surgical amputation: Secondary | ICD-10-CM | POA: Diagnosis not present

## 2015-08-07 DIAGNOSIS — E119 Type 2 diabetes mellitus without complications: Secondary | ICD-10-CM | POA: Diagnosis not present

## 2015-08-07 DIAGNOSIS — I2510 Atherosclerotic heart disease of native coronary artery without angina pectoris: Secondary | ICD-10-CM | POA: Diagnosis not present

## 2015-08-07 LAB — POCT INR: INR: 1

## 2015-08-07 NOTE — Progress Notes (Signed)
Took INR result from home health nurse of the phone.  She was still at patients home so she gave him instructions of taking 3 tablets of  warfarin =  for next 3 days.  She will check INR again on Monday August 29th.

## 2015-08-09 IMAGING — NM NM MYOCAR MULTI W/SPECT W/WALL MOTION & EF
2 series · 12 of 12 positions shown · non-contrast
Comparison: none

[Series 1: rest · 8.28mm/px · 6 of 64 frames shown]
[frame 6/64]
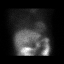
[frame 16/64]
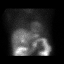
[frame 27/64]
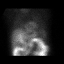
[frame 38/64]
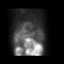
[frame 48/64]
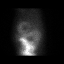
[frame 59/64]
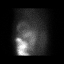

[Series 2: stress gated · 8.28mm/px · 6 of 64 frames shown]
[frame 6/64]
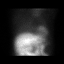
[frame 16/64]
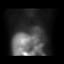
[frame 27/64]
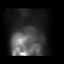
[frame 38/64]
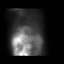
[frame 48/64]
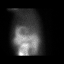
[frame 59/64]
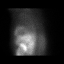

[12 of 12 positions shown; findings below may reference images not displayed]

This is a summary report. The complete report is available in the patient's medical record. If you cannot access the medical record, please contact the sending organization for a detailed fax or copy.

? There was no ST segment deviation noted during stress.
? Findings consistent with ischemia and prior myocardial infarction.
? This is a high risk study. High risk due to low ejection fraction and large area of ischemia.
? The left ventricular ejection fraction is moderately decreased (30-44%).
? There is a large inferior and inferolateral infarct
? There is a large area of iscehmia in the anterolateral wall
? There is borderline elevation of TID ratio (1.22)

30
Tc Cardiolite I6mUi rest 81m5i stress. Cardiologist to read.

## 2015-08-10 ENCOUNTER — Emergency Department (HOSPITAL_COMMUNITY)
Admission: EM | Admit: 2015-08-10 | Discharge: 2015-08-11 | Disposition: A | Discharge status: Home / Self Care | Payer: Medicare Other | Attending: Emergency Medicine | Admitting: Emergency Medicine

## 2015-08-10 ENCOUNTER — Encounter (HOSPITAL_COMMUNITY): Payer: Self-pay | Admitting: Vascular Surgery

## 2015-08-10 ENCOUNTER — Ambulatory Visit (INDEPENDENT_AMBULATORY_CARE_PROVIDER_SITE_OTHER): Payer: Medicare Other | Admitting: Pharmacist

## 2015-08-10 ENCOUNTER — Emergency Department (HOSPITAL_COMMUNITY): Payer: Medicare Other

## 2015-08-10 ENCOUNTER — Telehealth: Payer: Self-pay | Admitting: *Deleted

## 2015-08-10 DIAGNOSIS — I739 Peripheral vascular disease, unspecified: Secondary | ICD-10-CM

## 2015-08-10 DIAGNOSIS — I2510 Atherosclerotic heart disease of native coronary artery without angina pectoris: Secondary | ICD-10-CM | POA: Insufficient documentation

## 2015-08-10 DIAGNOSIS — I252 Old myocardial infarction: Secondary | ICD-10-CM | POA: Diagnosis not present

## 2015-08-10 DIAGNOSIS — X58XXXA Exposure to other specified factors, initial encounter: Secondary | ICD-10-CM | POA: Diagnosis not present

## 2015-08-10 DIAGNOSIS — B999 Unspecified infectious disease: Secondary | ICD-10-CM

## 2015-08-10 DIAGNOSIS — Z7901 Long term (current) use of anticoagulants: Secondary | ICD-10-CM | POA: Diagnosis not present

## 2015-08-10 DIAGNOSIS — Z89511 Acquired absence of right leg below knee: Secondary | ICD-10-CM | POA: Diagnosis not present

## 2015-08-10 DIAGNOSIS — M79604 Pain in right leg: Secondary | ICD-10-CM | POA: Diagnosis present

## 2015-08-10 DIAGNOSIS — T814XXA Infection following a procedure, initial encounter: Secondary | ICD-10-CM | POA: Insufficient documentation

## 2015-08-10 DIAGNOSIS — I10 Essential (primary) hypertension: Secondary | ICD-10-CM | POA: Diagnosis not present

## 2015-08-10 DIAGNOSIS — Z951 Presence of aortocoronary bypass graft: Secondary | ICD-10-CM | POA: Insufficient documentation

## 2015-08-10 DIAGNOSIS — Y998 Other external cause status: Secondary | ICD-10-CM | POA: Diagnosis not present

## 2015-08-10 DIAGNOSIS — Z79899 Other long term (current) drug therapy: Secondary | ICD-10-CM | POA: Diagnosis not present

## 2015-08-10 DIAGNOSIS — E785 Hyperlipidemia, unspecified: Secondary | ICD-10-CM | POA: Insufficient documentation

## 2015-08-10 DIAGNOSIS — Y9289 Other specified places as the place of occurrence of the external cause: Secondary | ICD-10-CM | POA: Insufficient documentation

## 2015-08-10 DIAGNOSIS — E119 Type 2 diabetes mellitus without complications: Secondary | ICD-10-CM | POA: Diagnosis not present

## 2015-08-10 DIAGNOSIS — M7989 Other specified soft tissue disorders: Secondary | ICD-10-CM | POA: Diagnosis not present

## 2015-08-10 DIAGNOSIS — Z880 Allergy status to penicillin: Secondary | ICD-10-CM | POA: Diagnosis not present

## 2015-08-10 DIAGNOSIS — Z4781 Encounter for orthopedic aftercare following surgical amputation: Secondary | ICD-10-CM | POA: Diagnosis not present

## 2015-08-10 DIAGNOSIS — Z720 Tobacco use: Secondary | ICD-10-CM | POA: Insufficient documentation

## 2015-08-10 DIAGNOSIS — IMO0001 Reserved for inherently not codable concepts without codable children: Secondary | ICD-10-CM

## 2015-08-10 DIAGNOSIS — Y9389 Activity, other specified: Secondary | ICD-10-CM | POA: Diagnosis not present

## 2015-08-10 LAB — POCT INR: INR: 0.9

## 2015-08-10 MED ORDER — WARFARIN SODIUM 5 MG PO TABS: ORAL_TABLET | ORAL | Status: DC

## 2015-08-10 NOTE — Telephone Encounter (Signed)
INR was 0.9  today Patient took Coumadin 15 mg on Friday and none Saturday or Sunday. Please call Lynden Ang (home health nurse) back with instructions.

## 2015-08-10 NOTE — ED Notes (Signed)
Patient transported to X-ray 

## 2015-08-10 NOTE — Telephone Encounter (Signed)
Take  by mouth daily for next 3 days.  Recheck INR 08/13/15 Rx sent in to pharmacy and patient niece Abby notified as well as [name] with [facility]

## 2015-08-10 NOTE — ED Provider Notes (Signed)
CSN: [id number]     Arrival date & time 08/10/15  1742 History  This chart was scribed for Dione Booze, MD by Evon Slack, ED Scribe. This patient was seen in room A04C/A04C and the patient's care was started at 11:01 PM.      Chief Complaint  Patient presents with  . Leg Pain   The history is provided by the patient. No language interpreter was used.   HPI Comments: Hector Green is a 54 y.o. male with PMHx listed below who presents to the Emergency Department complaining of right leg pain and possible infection onset today. 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 Pt states that he was seen by a home health nurse today who squeezed a lot of drainage from the site. Pt states that he has been complaint with antibiotics prescribed. Denies fever or chills. Pt states that he was suppose to follow up with Dr.Duda today but had to reschedule due to not having transportation.  Pt states that he is suppose to follow up with Dr. Lajoyce Corners on August 24, 2015. Pt reports that he is on Coumadin.   Past Medical History  Diagnosis Date  . CAD (coronary artery disease) 2011    Multivessel s/p CABG in [place]  . Type 2 diabetes mellitus   . Hyperlipidemia   . Peripheral arterial disease   . Abnormal nuclear stress test 05/06/2015  . Myocardial infarction 2011  . Paranoid schizophrenia   . Essential hypertension     not on medications at this time   Past Surgical History  Procedure Laterality Date  . Coronary artery bypass graft  2011    Three vessel by report  . Abdominal aortagram N/A 03/30/2015    Procedure: ABDOMINAL [name];  Surgeon: Fransisco Hertz, MD;  Location: [facility] CATH LAB;  Service: Cardiovascular;  Laterality: N/A;  . Lower extremity angiogram N/A 03/30/2015    Procedure: LOWER EXTREMITY ANGIOGRAM;  Surgeon: Fransisco Hertz, MD;  Location: [facility] CATH LAB;  Service: Cardiovascular;  Laterality: N/A;  . Cardiac catheterization N/A 05/06/2015    Procedure: Left Heart Cath and  Cors/Grafts Angiography;  Surgeon: Tonny Bollman, MD;  Location: [facility] INVASIVE CV LAB;  Service: Cardiovascular;  Laterality: N/A;  . Femoral-tibial bypass graft Right 05/13/2015    Procedure: BYPASS GRAFT RIGHT FEMORAL-POSTERIOR TIBIAL ARTERY USING LEFT NONREVERSED TRANSLOCATED GREATER SAPPHENOUS VEIN;  Surgeon: Pryor Ochoa, MD;  Location: [facility] OR;  Service: Vascular;  Laterality: Right;  . Vein harvest Left 05/13/2015    Procedure: LEFT GREATER SAPPHENOUS VEIN HARVEST;  Surgeon: Pryor Ochoa, MD;  Location: [facility] OR;  Service: Vascular;  Laterality: Left;  . Endarterectomy femoral Right 05/13/2015    Procedure: RIGHT COMMON FEMORAL, SUPERFICIAL FEMORAL AND PROFUNDA ENDARTERECTOMY ;  Surgeon: Pryor Ochoa, MD;  Location: [facility] OR;  Service: Vascular;  Laterality: Right;  . Patch angioplasty Right 05/13/2015    Procedure: RIGHT FEMORAL ARTERY PATCH ANGIOPLASTY;  Surgeon: Pryor Ochoa, MD;  Location: [facility] OR;  Service: Vascular;  Laterality: Right;  . Intraoperative arteriogram Right 05/13/2015    Procedure: RIGHT LOWER LEG INTRA OPERATIVE ARTERIOGRAM;  Surgeon: Pryor Ochoa, MD;  Location: [facility] OR;  Service: Vascular;  Laterality: Right;  . Thrombectomy femoral artery Right 05/13/2015    Procedure: THROMBECTOMY RIGHT FEMORAL-PROXIMAL POSTERIOR TIBAIL ARTERY BYPASS GRAFT;  Surgeon: Larina Earthly, MD;  Location: [facility] OR;  Service: Vascular;  Laterality: Right;  . Colonoscopy    . Femoral-tibial bypass graft Right  05/27/2015    Procedure: THROMBECTOMY OF RIGHT FEMORAL-POSTERIOR TIBIAL ARTERY SAPHENOUS VEIN BYPASS GRAFT ;  Surgeon: Pryor Ochoa, MD;  Location: [facility] OR;  Service: Vascular;  Laterality: Right;  . Intraoperative arteriogram Right 05/27/2015    Procedure: INTRA OPERATIVE ARTERIOGRAM;  Surgeon: Pryor Ochoa, MD;  Location: [facility] OR;  Service: Vascular;  Laterality: Right;  . 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 Lajoyce Corners  . Amputation Right 07/08/2015    Procedure: AMPUTATION BELOW RIGHT KNEE;  Surgeon:  Nadara Mustard, MD;  Location: [facility] OR;  Service: Orthopedics;  Laterality: Right;   Family History  Problem Relation Age of Onset  . Cancer Father    Social History  Substance Use Topics  . Smoking status: Light Tobacco Smoker -- 0.50 packs/day for 15 years    Types: Cigarettes    Start date: 08/24/1975  . Smokeless tobacco: Never Used  . Alcohol Use: No    Review of Systems  Constitutional: Negative for fever and chills.  Skin: Positive for wound.  All other systems reviewed and are negative.    Allergies  Fish-derived products and Penicillins  Home Medications   Prior to Admission medications   Medication Sig Start Date End Date Taking? Authorizing Provider  Oxycodone HCl 10 MG TABS Take 1 tablet (10 mg total) by mouth every 6 (six) hours as needed. 06/29/15  Yes Pryor Ochoa, MD  oxyCODONE-acetaminophen (ROXICET) 5-325 MG per tablet Take 1 tablet by mouth every 4 (four) hours as needed for severe pain. 07/10/15  Yes Nadara Mustard, MD  warfarin (COUMADIN) 5 MG tablet Take 3 tablets for next 3 days, then take 2 tablets daily or as instructed by anticoagulation clinic. Patient taking differently: Take 5mg  tablet one every day patient states he has not had a dose in couple of days because he ran out of medication 08/10/15  Yes Tammy Eckard, PHARMD  atorvastatin (LIPITOR) 80 MG tablet Take 1 tablet (80 mg total) by mouth daily. Patient not taking: Reported on 08/10/2015 04/29/15   Dyann Kief, PA-C  glimepiride (AMARYL) 4 MG tablet Take 1 tablet (4 mg total) by mouth daily before breakfast. Patient not taking: Reported on 08/10/2015 06/26/15   Tammy Eckard, PHARMD  glucose blood test strip One Touch Verio.  Use to check BG up to bid.  Dx:  E11.8 (incontrolled type 2 DM) Patient not taking: Reported on 08/10/2015 06/26/15   Tammy Eckard, PHARMD  levofloxacin (LEVAQUIN) 750 MG tablet Take 1 tablet (750 mg total) by mouth daily. Patient not taking: Reported on 08/10/2015 05/29/15    Samantha J Rhyne, PA-C  ONETOUCH DELICA LANCETS 33G MISC Use to check BG up to bid.  Dx: E11.8 (uncontrolled type 2 DM) 06/26/15   Tammy Eckard, PHARMD   BP 135/64 mmHg  Pulse 83  Temp(Src) 97.9 F (36.6 C) (Oral)  Resp 16  SpO2 94%   Physical Exam  Constitutional: He is oriented to person, place, and time. He appears well-developed and well-nourished. No distress.  HENT:  Head: Normocephalic and atraumatic.  Eyes: Conjunctivae and EOM are normal. Pupils are equal, round, and reactive to light.  Neck: Normal range of motion. Neck supple. No JVD present.  Cardiovascular: Normal rate, regular rhythm and normal heart sounds.   No murmur heard. Pulmonary/Chest: Effort normal and breath sounds normal. He has no wheezes. He has no rales. He exhibits no tenderness.  Abdominal: Soft. Bowel sounds are normal. He exhibits no distension and no mass.  There is no tenderness.  Musculoskeletal: Normal range of motion. He exhibits no edema.  right BKA staples present, mild erythema of the sutura line, skin tearing on the medial aspect, mild bogginess, mild serosanguinous drainage no purulent drainage.   Lymphadenopathy:    He has no cervical adenopathy.  Neurological: He is alert and oriented to person, place, and time. No cranial nerve deficit. He exhibits normal muscle tone. Coordination normal.  Skin: Skin is warm and dry.  Psychiatric: He has a normal mood and affect. His behavior is normal. Judgment and thought content normal.  Nursing note and vitals reviewed.   ED Course  Procedures (including critical care time) DIAGNOSTIC STUDIES: Oxygen Saturation is 94% on RA, adequate  by my interpretation.    COORDINATION OF CARE: 11:59 PM-Discussed treatment plan with pt at bedside and pt agreed to plan.     Labs Review Results for orders placed or performed during the hospital encounter of 08/10/15  CBC with Differential  Result Value Ref Range   WBC 8.5 4.0 - 10.5 K/uL   RBC 4.76 4.22 -  5.81 MIL/uL   Hemoglobin 13.6 13.0 - 17.0 g/dL   HCT 16.1 09.6 - 04.5 %   MCV 83.6 78.0 - 100.0 fL   MCH 28.6 26.0 - 34.0 pg   MCHC 34.2 30.0 - 36.0 g/dL   RDW 40.9 81.1 - 91.4 %   Platelets 140 (L) 150 - 400 K/uL   Neutrophils Relative % 57 43 - 77 %   Neutro Abs 4.9 1.7 - 7.7 K/uL   Lymphocytes Relative 31 12 - 46 %   Lymphs Abs 2.6 0.7 - 4.0 K/uL   Monocytes Relative 8 3 - 12 %   Monocytes Absolute 0.7 0.1 - 1.0 K/uL   Eosinophils Relative 3 0 - 5 %   Eosinophils Absolute 0.3 0.0 - 0.7 K/uL   Basophils Relative 1 0 - 1 %   Basophils Absolute 0.0 0.0 - 0.1 K/uL  Basic metabolic panel  Result Value Ref Range   Sodium 136 135 - 145 mmol/L   Potassium 4.3 3.5 - 5.1 mmol/L   Chloride 104 101 - 111 mmol/L   CO2 26 22 - 32 mmol/L   Glucose, Bld 248 (H) 65 - 99 mg/dL   BUN 30 (H) 6 - 20 mg/dL   Creatinine, Ser 7.82 0.61 - 1.24 mg/dL   Calcium 9.2 8.9 - 95.6 mg/dL   GFR calc non Af Amer >60 >60 mL/min   GFR calc Af Amer >60 >60 mL/min   Anion gap 6 5 - 15    Imaging Review Dg Knee 1-2 Views Right  08/10/2015   CLINICAL DATA:  Swelling and drainage at the right leg, acute onset, after amputation below the knee. Initial encounter.  EXAM: RIGHT KNEE - 1-2 VIEW  COMPARISON:  None.  FINDINGS: There is no definite evidence of osseous erosion to suggest osteomyelitis. However, scattered foci of air are suggested at the patient's stump, raising question for infection with a gas producing organism.  Associated soft tissue swelling is noted at the stump, with overlying skin staples. The knee joint is grossly unremarkable. No knee joint effusion is identified. Diffuse vascular calcifications are seen. There is no evidence of fracture or dislocation.  IMPRESSION: 1. Scattered foci of air suggested at the patient's stump, raising question for infection with a gas producing organism. Alternatively, this could reflect air tracking in from the site of surgery. Further evaluation is recommended, as  deemed clinically appropriate.  2. No definite evidence of osseous erosion to suggest osteomyelitis at this time. 3. Diffuse vascular calcifications seen. These results were called by telephone at the time of interpretation on 08/10/2015 at 11:56 pm to Dr. Dione Booze, who verbally acknowledged these results.   Electronically Signed   By: Roanna Raider M.D.   On: 08/10/2015 23:58   I have personally reviewed and evaluated these images and lab results as part of my medical decision-making. Findings were discussed with radiologist.    MDM   Final diagnoses:  Surgical wound infection, initial encounter     Drainage from incision from below the knee amputation. There is no overt infection but I am worried that the wound may be in the process of early dehiscence. X-rays obtained showing some bubbles of air in the soft tissue. He will need to be evaluated by his orthopedic surgeon. Screening labs have been obtained.   WBC is normal but x-ray does show some gas in the soft tissue. However, clinically, he does not have significant infection. There is no fever and he has no pain at the site although he does have normal sensation, and WBC is normal. Case was discussed with Dr. Otelia Sergeant, on call for Dr. Lajoyce Corners, who recommends placing him on antibiotics and follow-up in the office tomorrow. He is to call at 8:15 AM to schedule a same-day appointment. It was recommended to start him on doxycycline, but he is taking warfarin and there is an interaction with doxycycline. He is started on clindamycin instead.  I personally performed the services described in this documentation, which was scribed in my presence. The recorded information has been reviewed and is accurate.       Dione Booze, MD 08/11/15 [phone]

## 2015-08-10 NOTE — ED Notes (Signed)
Pt reports to the ED for eval of right leg infection. He had a BKA done approx 1 month ago. He was on abx and has been compliant with the abx. He reports he was seen by the home health RN who was able to express purulent drainage and suggested he come here for eval. Denies any fevers or chills. Dressing in place. He is diabetic. Pt A&Ox4, resp e/u, and skin warm and dry.

## 2015-08-11 DIAGNOSIS — T814XXA Infection following a procedure, initial encounter: Secondary | ICD-10-CM | POA: Diagnosis not present

## 2015-08-11 LAB — BASIC METABOLIC PANEL
Anion gap: 6 (ref 5–15)
BUN: 30 mg/dL — ABNORMAL HIGH (ref 6–20)
CALCIUM: 9.2 mg/dL (ref 8.9–10.3)
CHLORIDE: 104 mmol/L (ref 101–111)
CO2: 26 mmol/L (ref 22–32)
Creatinine, Ser: 0.75 mg/dL (ref 0.61–1.24)
GFR calc Af Amer: >60 mL/min (ref >60)
GFR calc non Af Amer: >60 mL/min (ref >60)
GLUCOSE: 248 mg/dL — AB (ref 65–99)
Potassium: 4.3 mmol/L (ref 3.5–5.1)
Sodium: 136 mmol/L (ref 135–145)

## 2015-08-11 LAB — CBC WITH DIFFERENTIAL/PLATELET
Basophils Absolute: 0 10*3/uL (ref 0.0–0.1)
Basophils Relative: 1 % (ref 0–1)
Eosinophils Absolute: 0.3 10*3/uL (ref 0.0–0.7)
Eosinophils Relative: 3 % (ref 0–5)
HEMATOCRIT: 39.8 % (ref 39.0–52.0)
HEMOGLOBIN: 13.6 g/dL (ref 13.0–17.0)
LYMPHS PCT: 31 % (ref 12–46)
Lymphs Abs: 2.6 10*3/uL (ref 0.7–4.0)
MCH: 28.6 pg (ref 26.0–34.0)
MCHC: 34.2 g/dL (ref 30.0–36.0)
MCV: 83.6 fL (ref 78.0–100.0)
MONO ABS: 0.7 10*3/uL (ref 0.1–1.0)
MONOS PCT: 8 % (ref 3–12)
NEUTROS ABS: 4.9 10*3/uL (ref 1.7–7.7)
NEUTROS PCT: 57 % (ref 43–77)
Platelets: 140 10*3/uL — ABNORMAL LOW (ref 150–400)
RBC: 4.76 MIL/uL (ref 4.22–5.81)
RDW: 14.1 % (ref 11.5–15.5)
WBC: 8.5 10*3/uL (ref 4.0–10.5)

## 2015-08-11 MED ORDER — DOXYCYCLINE HYCLATE 100 MG PO TABS: 100.0000 mg | ORAL_TABLET | Freq: Once | ORAL | Status: DC

## 2015-08-11 MED ORDER — CLINDAMYCIN HCL 150 MG PO CAPS: 150.0000 mg | ORAL_CAPSULE | Freq: Four times a day (QID) | ORAL | Status: DC

## 2015-08-11 MED ORDER — DOXYCYCLINE HYCLATE 100 MG PO CAPS: 100.0000 mg | ORAL_CAPSULE | Freq: Two times a day (BID) | ORAL | Status: DC

## 2015-08-11 MED ORDER — CLINDAMYCIN HCL 150 MG PO CAPS
150.0000 mg | ORAL_CAPSULE | Freq: Once | ORAL | Status: AC
  Administered 2015-08-11: 150 mg via ORAL
  Filled 2015-08-11: qty 1

## 2015-08-11 NOTE — ED Notes (Signed)
Patient is alert and orientedx4.  Patient was explained discharge instructions and they understood them with no questions.   

## 2015-08-11 NOTE — Discharge Instructions (Signed)
Make sure to see your orthopedic surgeon tomorrow. Continue routine wound care.  Clindamycin capsules What is this medicine? CLINDAMYCIN (KLIN da MYE sin) is a lincosamide antibiotic. It is used to treat certain kinds of bacterial infections. It will not work for colds, flu, or other viral infections. This medicine may be used for other purposes; ask your health care provider or pharmacist if you have questions. COMMON BRAND NAME(S): Cleocin What should I tell my health care provider before I take this medicine? They need to know if you have any of these conditions: -kidney disease -liver disease -stomach problems like colitis -an unusual or allergic reaction to clindamycin, lincomycin, or other medicines, foods, dyes like tartrazine or preservatives -pregnant or trying to get pregnant -breast-feeding How should I use this medicine? Take this medicine by mouth with a full glass of water. Follow the directions on the prescription label. You can take this medicine with food or on an empty stomach. If the medicine upsets your stomach, take it with food. Take your medicine at regular intervals. Do not take your medicine more often than directed. Take all of your medicine as directed even if you think your are better. Do not skip doses or stop your medicine early. Talk to your pediatrician regarding the use of this medicine in children. Special care may be needed. Overdosage: If you think you have taken too much of this medicine contact a poison control center or emergency room at once. NOTE: This medicine is only for you. Do not share this medicine with others. What if I miss a dose? If you miss a dose, take it as soon as you can. If it is almost time for your next dose, take only that dose. Do not take double or extra doses. What may interact with this medicine? -birth control pills -chloramphenicol -erythromycin -kaolin products This list may not describe all possible interactions. Give your  health care provider a list of all the medicines, herbs, non-prescription drugs, or dietary supplements you use. Also tell them if you smoke, drink alcohol, or use illegal drugs. Some items may interact with your medicine. What should I watch for while using this medicine? Tell your doctor or healthcare professional if your symptoms do not start to get better or if they get worse. Do not treat diarrhea with over the counter products. Contact your doctor if you have diarrhea that lasts more than 2 days or if it is severe and watery. What side effects may I notice from receiving this medicine? Side effects that you should report to your doctor or health care professional as soon as possible: -allergic reactions like skin rash, itching or hives, swelling of the face, lips, or tongue -dark urine -pain on swallowing -redness, blistering, peeling or loosening of the skin, including inside the mouth -unusual bleeding or bruising -unusually weak or tired -yellowing of eyes or skin Side effects that usually do not require medical attention (report to your doctor or health care professional if they continue or are bothersome): -diarrhea -itching in the rectal or genital area -joint pain -nausea, vomiting -stomach pain This list may not describe all possible side effects. Call your doctor for medical advice about side effects. You may report side effects to FDA at 1-800-FDA-1088. Where should I keep my medicine? Keep out of the reach of children. Store at room temperature between 20 and 25 degrees C (68 and 77 degrees F). Throw away any unused medicine after the expiration date. NOTE: This sheet is a summary.  It may not cover all possible information. If you have questions about this medicine, talk to your doctor, pharmacist, or health care provider.  2015, Elsevier/Gold Standard. (2013-07-04 16:12:32)

## 2015-08-14 ENCOUNTER — Telehealth: Payer: Self-pay | Admitting: Family Medicine

## 2015-08-14 ENCOUNTER — Ambulatory Visit: Payer: Self-pay | Admitting: Pharmacist

## 2015-08-14 DIAGNOSIS — E119 Type 2 diabetes mellitus without complications: Secondary | ICD-10-CM | POA: Diagnosis not present

## 2015-08-14 DIAGNOSIS — I739 Peripheral vascular disease, unspecified: Secondary | ICD-10-CM | POA: Diagnosis not present

## 2015-08-14 DIAGNOSIS — I2510 Atherosclerotic heart disease of native coronary artery without angina pectoris: Secondary | ICD-10-CM | POA: Diagnosis not present

## 2015-08-14 DIAGNOSIS — I10 Essential (primary) hypertension: Secondary | ICD-10-CM | POA: Diagnosis not present

## 2015-08-14 DIAGNOSIS — Z4781 Encounter for orthopedic aftercare following surgical amputation: Secondary | ICD-10-CM | POA: Diagnosis not present

## 2015-08-14 DIAGNOSIS — Z89511 Acquired absence of right leg below knee: Secondary | ICD-10-CM | POA: Diagnosis not present

## 2015-08-14 LAB — POCT INR: INR: 1

## 2015-08-14 NOTE — Telephone Encounter (Signed)
Take 3 tablets =  for next 4 days and recheck 08/18/15 [facility] with [facility], his niece Abby and patient notified.

## 2015-08-14 NOTE — Telephone Encounter (Signed)
protime 12.3  INR 1.0  Started coumadin  yesterday, will take tomorrow also. What to do after that?

## 2015-08-18 ENCOUNTER — Ambulatory Visit: Payer: Self-pay | Admitting: Pharmacist Clinician (PhC)/ Clinical Pharmacy Specialist

## 2015-08-18 DIAGNOSIS — E119 Type 2 diabetes mellitus without complications: Secondary | ICD-10-CM | POA: Diagnosis not present

## 2015-08-18 DIAGNOSIS — Z4781 Encounter for orthopedic aftercare following surgical amputation: Secondary | ICD-10-CM | POA: Diagnosis not present

## 2015-08-18 DIAGNOSIS — Z89511 Acquired absence of right leg below knee: Secondary | ICD-10-CM | POA: Diagnosis not present

## 2015-08-18 DIAGNOSIS — I739 Peripheral vascular disease, unspecified: Secondary | ICD-10-CM

## 2015-08-18 DIAGNOSIS — I2510 Atherosclerotic heart disease of native coronary artery without angina pectoris: Secondary | ICD-10-CM | POA: Diagnosis not present

## 2015-08-18 DIAGNOSIS — I10 Essential (primary) hypertension: Secondary | ICD-10-CM | POA: Diagnosis not present

## 2015-08-18 LAB — POCT INR: INR: 3

## 2015-08-24 ENCOUNTER — Other Ambulatory Visit (HOSPITAL_COMMUNITY): Payer: Self-pay | Admitting: Orthopedic Surgery

## 2015-08-25 ENCOUNTER — Telehealth: Payer: Self-pay | Admitting: *Deleted

## 2015-08-25 DIAGNOSIS — Z89511 Acquired absence of right leg below knee: Secondary | ICD-10-CM | POA: Diagnosis not present

## 2015-08-25 DIAGNOSIS — E119 Type 2 diabetes mellitus without complications: Secondary | ICD-10-CM | POA: Diagnosis not present

## 2015-08-25 DIAGNOSIS — I2510 Atherosclerotic heart disease of native coronary artery without angina pectoris: Secondary | ICD-10-CM | POA: Diagnosis not present

## 2015-08-25 DIAGNOSIS — I739 Peripheral vascular disease, unspecified: Secondary | ICD-10-CM | POA: Diagnosis not present

## 2015-08-25 DIAGNOSIS — I10 Essential (primary) hypertension: Secondary | ICD-10-CM | POA: Diagnosis not present

## 2015-08-25 DIAGNOSIS — Z4781 Encounter for orthopedic aftercare following surgical amputation: Secondary | ICD-10-CM | POA: Diagnosis not present

## 2015-08-25 NOTE — Telephone Encounter (Signed)
Called and gave advanced new orders

## 2015-08-25 NOTE — Telephone Encounter (Signed)
Discussed with Dr Hyacinth Meeker. Hold today's dose. Take 10 mg daily and rck in 1 week.

## 2015-08-25 NOTE — Telephone Encounter (Signed)
Protime 40.6  INR 3.7 .Taking  daily, except  on Thursdays and Saturdays

## 2015-08-27 NOTE — Progress Notes (Signed)
Pt's sister has pt's phone and she is 2 hours away from him. She will see him tonight so I gave her pre-op instructions of NPO after MN, pt to take Oxycodone in AM if needed with a sip of water, pt to arrive at 9:30 AM. She voiced understanding and will give pt instructions when she sees him.

## 2015-08-28 ENCOUNTER — Encounter (HOSPITAL_COMMUNITY)
Admission: RE | Disposition: A | Discharge status: Home / Self Care | Payer: Self-pay | Source: Ambulatory Visit | Attending: Orthopedic Surgery

## 2015-08-28 ENCOUNTER — Ambulatory Visit (HOSPITAL_COMMUNITY)
Admission: RE | Admit: 2015-08-28 | Discharge: 2015-08-28 | Disposition: A | Discharge status: Home / Self Care | Payer: Medicare Other | Source: Ambulatory Visit | Attending: Orthopedic Surgery | Admitting: Orthopedic Surgery

## 2015-08-28 ENCOUNTER — Ambulatory Visit (HOSPITAL_COMMUNITY): Payer: Medicare Other | Admitting: Anesthesiology

## 2015-08-28 DIAGNOSIS — Z951 Presence of aortocoronary bypass graft: Secondary | ICD-10-CM | POA: Insufficient documentation

## 2015-08-28 DIAGNOSIS — T8781 Dehiscence of amputation stump: Secondary | ICD-10-CM | POA: Diagnosis not present

## 2015-08-28 DIAGNOSIS — F200 Paranoid schizophrenia: Secondary | ICD-10-CM | POA: Diagnosis not present

## 2015-08-28 DIAGNOSIS — E785 Hyperlipidemia, unspecified: Secondary | ICD-10-CM | POA: Diagnosis not present

## 2015-08-28 DIAGNOSIS — Z89519 Acquired absence of unspecified leg below knee: Secondary | ICD-10-CM | POA: Diagnosis not present

## 2015-08-28 DIAGNOSIS — I10 Essential (primary) hypertension: Secondary | ICD-10-CM | POA: Diagnosis not present

## 2015-08-28 DIAGNOSIS — I70261 Atherosclerosis of native arteries of extremities with gangrene, right leg: Secondary | ICD-10-CM | POA: Diagnosis not present

## 2015-08-28 DIAGNOSIS — E1142 Type 2 diabetes mellitus with diabetic polyneuropathy: Secondary | ICD-10-CM | POA: Insufficient documentation

## 2015-08-28 DIAGNOSIS — I252 Old myocardial infarction: Secondary | ICD-10-CM | POA: Diagnosis not present

## 2015-08-28 DIAGNOSIS — F17210 Nicotine dependence, cigarettes, uncomplicated: Secondary | ICD-10-CM | POA: Diagnosis not present

## 2015-08-28 DIAGNOSIS — E1151 Type 2 diabetes mellitus with diabetic peripheral angiopathy without gangrene: Secondary | ICD-10-CM | POA: Insufficient documentation

## 2015-08-28 DIAGNOSIS — Z79899 Other long term (current) drug therapy: Secondary | ICD-10-CM | POA: Diagnosis not present

## 2015-08-28 DIAGNOSIS — Y835 Amputation of limb(s) as the cause of abnormal reaction of the patient, or of later complication, without mention of misadventure at the time of the procedure: Secondary | ICD-10-CM | POA: Diagnosis not present

## 2015-08-28 DIAGNOSIS — I2510 Atherosclerotic heart disease of native coronary artery without angina pectoris: Secondary | ICD-10-CM | POA: Insufficient documentation

## 2015-08-28 DIAGNOSIS — T8751 Necrosis of amputation stump, right upper extremity: Secondary | ICD-10-CM | POA: Diagnosis not present

## 2015-08-28 HISTORY — PX: STUMP REVISION: SHX6102

## 2015-08-28 LAB — GLUCOSE, CAPILLARY
GLUCOSE-CAPILLARY: 214 mg/dL — AB (ref 65–99)
Glucose-Capillary: 264 mg/dL — ABNORMAL HIGH (ref 65–99)
Glucose-Capillary: 223 mg/dL — ABNORMAL HIGH (ref 65–99)

## 2015-08-28 SURGERY — REVISION, AMPUTATION SITE
Anesthesia: General | Site: Leg Lower | Laterality: Right

## 2015-08-28 MED ORDER — FENTANYL CITRATE (PF) 100 MCG/2ML IJ SOLN
INTRAMUSCULAR | Status: AC
  Filled 2015-08-28: qty 2

## 2015-08-28 MED ORDER — HYDROMORPHONE HCL 1 MG/ML IJ SOLN
0.2500 mg | INTRAMUSCULAR | Status: DC | PRN
  Administered 2015-08-28 (×7): 0.5 mg via INTRAVENOUS

## 2015-08-28 MED ORDER — PROPOFOL 10 MG/ML IV BOLUS
INTRAVENOUS | Status: DC | PRN
  Administered 2015-08-28: 160 mg via INTRAVENOUS

## 2015-08-28 MED ORDER — FENTANYL CITRATE (PF) 100 MCG/2ML IJ SOLN
100.0000 ug | Freq: Once | INTRAMUSCULAR | Status: AC
  Administered 2015-08-28: 50 ug via INTRAVENOUS
  Filled 2015-08-28: qty 2

## 2015-08-28 MED ORDER — 0.9 % SODIUM CHLORIDE (POUR BTL) OPTIME
TOPICAL | Status: DC | PRN
  Administered 2015-08-28: 1000 mL

## 2015-08-28 MED ORDER — MIDAZOLAM HCL 5 MG/5ML IJ SOLN
INTRAMUSCULAR | Status: DC | PRN
  Administered 2015-08-28: 2 mg via INTRAVENOUS

## 2015-08-28 MED ORDER — CHLORHEXIDINE GLUCONATE 4 % EX LIQD: 60.0000 mL | Freq: Once | CUTANEOUS | Status: DC

## 2015-08-28 MED ORDER — ONDANSETRON HCL 4 MG/2ML IJ SOLN
INTRAMUSCULAR | Status: DC | PRN
  Administered 2015-08-28: 4 mg via INTRAVENOUS

## 2015-08-28 MED ORDER — CLINDAMYCIN PHOSPHATE 900 MG/50ML IV SOLN
INTRAVENOUS | Status: AC
  Administered 2015-08-28: 900 mg via INTRAVENOUS
  Filled 2015-08-28: qty 50

## 2015-08-28 MED ORDER — PROMETHAZINE HCL 25 MG/ML IJ SOLN: 6.2500 mg | INTRAMUSCULAR | Status: DC | PRN

## 2015-08-28 MED ORDER — LIDOCAINE HCL (CARDIAC) 20 MG/ML IV SOLN
INTRAVENOUS | Status: DC | PRN
  Administered 2015-08-28: 50 mg via INTRAVENOUS

## 2015-08-28 MED ORDER — PROPOFOL 10 MG/ML IV BOLUS
INTRAVENOUS | Status: AC
  Filled 2015-08-28: qty 20

## 2015-08-28 MED ORDER — FENTANYL CITRATE (PF) 250 MCG/5ML IJ SOLN
INTRAMUSCULAR | Status: AC
  Filled 2015-08-28: qty 5

## 2015-08-28 MED ORDER — LACTATED RINGERS IV SOLN
INTRAVENOUS | Status: DC
  Administered 2015-08-28 (×2): via INTRAVENOUS

## 2015-08-28 MED ORDER — HYDROMORPHONE HCL 1 MG/ML IJ SOLN: 0.2500 mg | INTRAMUSCULAR | Status: DC | PRN

## 2015-08-28 MED ORDER — MIDAZOLAM HCL 2 MG/2ML IJ SOLN
INTRAMUSCULAR | Status: AC
  Filled 2015-08-28: qty 4

## 2015-08-28 MED ORDER — FENTANYL CITRATE (PF) 100 MCG/2ML IJ SOLN
INTRAMUSCULAR | Status: DC | PRN
  Administered 2015-08-28: 50 ug via INTRAVENOUS
  Administered 2015-08-28: 100 ug via INTRAVENOUS
  Administered 2015-08-28 (×2): 50 ug via INTRAVENOUS

## 2015-08-28 MED ORDER — CLINDAMYCIN PHOSPHATE 900 MG/50ML IV SOLN: 900.0000 mg | INTRAVENOUS | Status: DC

## 2015-08-28 MED ORDER — MIDAZOLAM HCL 2 MG/2ML IJ SOLN
2.0000 mg | Freq: Once | INTRAMUSCULAR | Status: AC
  Administered 2015-08-28: 2 mg via INTRAVENOUS
  Filled 2015-08-28: qty 2

## 2015-08-28 MED ORDER — HYDROMORPHONE HCL 1 MG/ML IJ SOLN
INTRAMUSCULAR | Status: AC
  Filled 2015-08-28: qty 2

## 2015-08-28 MED ORDER — MIDAZOLAM HCL 2 MG/2ML IJ SOLN
INTRAMUSCULAR | Status: AC
  Filled 2015-08-28: qty 2

## 2015-08-28 SURGICAL SUPPLY — 51 items
BANDAGE ESMARK 6X9 LF (GAUZE/BANDAGES/DRESSINGS) ×1 IMPLANT
BLADE SAW RECIP 87.9 MT (BLADE) ×3 IMPLANT
BNDG COHESIVE 6X5 TAN STRL LF (GAUZE/BANDAGES/DRESSINGS) ×3 IMPLANT
BNDG ESMARK 6X9 LF (GAUZE/BANDAGES/DRESSINGS) ×3
BNDG GAUZE ELAST 4 BULKY (GAUZE/BANDAGES/DRESSINGS) ×3 IMPLANT
BNDG GAUZE STRTCH 6 (GAUZE/BANDAGES/DRESSINGS) IMPLANT
COVER SURGICAL LIGHT HANDLE (MISCELLANEOUS) ×3 IMPLANT
CUFF TOURNIQUET SINGLE 34IN LL (TOURNIQUET CUFF) IMPLANT
DRAIN PENROSE 1/2X12 LTX STRL (WOUND CARE) IMPLANT
DRAPE EXTREMITY T 121X128X90 (DRAPE) ×3 IMPLANT
DRAPE PROXIMA HALF (DRAPES) ×3 IMPLANT
DRAPE U-SHAPE 47X51 STRL (DRAPES) ×3 IMPLANT
DRSG ADAPTIC 3X8 NADH LF (GAUZE/BANDAGES/DRESSINGS) ×3 IMPLANT
DRSG PAD ABDOMINAL 8X10 ST (GAUZE/BANDAGES/DRESSINGS) ×6 IMPLANT
DURAPREP 26ML APPLICATOR (WOUND CARE) ×3 IMPLANT
ELECT CAUTERY BLADE 6.4 (BLADE) IMPLANT
ELECT REM PT RETURN 9FT ADLT (ELECTROSURGICAL) ×3
ELECTRODE REM PT RTRN 9FT ADLT (ELECTROSURGICAL) ×1 IMPLANT
EVACUATOR 1/8 PVC DRAIN (DRAIN) IMPLANT
GAUZE SPONGE 4X4 12PLY STRL (GAUZE/BANDAGES/DRESSINGS) ×3 IMPLANT
GLOVE BIOGEL PI IND STRL 7.0 (GLOVE) ×1 IMPLANT
GLOVE BIOGEL PI IND STRL 9 (GLOVE) ×1 IMPLANT
GLOVE BIOGEL PI INDICATOR 7.0 (GLOVE) ×2
GLOVE BIOGEL PI INDICATOR 9 (GLOVE) ×2
GLOVE SURG ORTHO 9.0 STRL STRW (GLOVE) ×3 IMPLANT
GLOVE SURG SS PI 7.0 STRL IVOR (GLOVE) ×3 IMPLANT
GOWN STRL REUS W/ TWL XL LVL3 (GOWN DISPOSABLE) ×1 IMPLANT
GOWN STRL REUS W/TWL XL LVL3 (GOWN DISPOSABLE) ×2
KIT BASIN OR (CUSTOM PROCEDURE TRAY) ×3 IMPLANT
KIT ROOM TURNOVER OR (KITS) ×3 IMPLANT
MANIFOLD NEPTUNE II (INSTRUMENTS) IMPLANT
NS IRRIG 1000ML POUR BTL (IV SOLUTION) ×3 IMPLANT
PACK GENERAL/GYN (CUSTOM PROCEDURE TRAY) ×3 IMPLANT
PAD ARMBOARD 7.5X6 YLW CONV (MISCELLANEOUS) ×3 IMPLANT
PAD CAST 4YDX4 CTTN HI CHSV (CAST SUPPLIES) ×1 IMPLANT
PADDING CAST COTTON 4X4 STRL (CAST SUPPLIES) ×2
PADDING CAST COTTON 6X4 STRL (CAST SUPPLIES) ×3 IMPLANT
SPONGE LAP 18X18 X RAY DECT (DISPOSABLE) IMPLANT
STAPLER VISISTAT 35W (STAPLE) ×3 IMPLANT
STOCKINETTE IMPERVIOUS LG (DRAPES) ×3 IMPLANT
SUT ETHILON 2 0 PSLX (SUTURE) ×9 IMPLANT
SUT PDS AB 1 CT  36 (SUTURE)
SUT PDS AB 1 CT 36 (SUTURE) IMPLANT
SUT SILK 2 0 (SUTURE) ×2
SUT SILK 2-0 18XBRD TIE 12 (SUTURE) ×1 IMPLANT
SUT VIC AB 1 CTX 36 (SUTURE)
SUT VIC AB 1 CTX36XBRD ANBCTR (SUTURE) IMPLANT
TOWEL OR 17X24 6PK STRL BLUE (TOWEL DISPOSABLE) ×3 IMPLANT
TOWEL OR 17X26 10 PK STRL BLUE (TOWEL DISPOSABLE) ×3 IMPLANT
TUBE ANAEROBIC SPECIMEN COL (MISCELLANEOUS) IMPLANT
WATER STERILE IRR 1000ML POUR (IV SOLUTION) IMPLANT

## 2015-08-28 NOTE — Anesthesia Postprocedure Evaluation (Signed)
Anesthesia Post Note  Patient: Hector Green  Procedure(s) Performed: Procedure(s) (LRB): Revision Right Below Knee Amputation (Right)  Anesthesia type: General  Patient location: PACU  Post pain: Pain level controlled  Post assessment: Post-op Vital signs reviewed  Last Vitals: BP 141/72 mmHg  Pulse 106  Temp(Src) 36.7 C (Oral)  Resp 15  Ht  (1.778 m)  Wt 173 lb (78.472 kg)  BMI 24.82 kg/m2  SpO2 93%  Post vital signs: Reviewed  Level of consciousness: awake and alert  PACU course: pt with severe pain postop. Discussed popliteal nerve block and pt requested the block. Right popliteal block done under U/S guidance in PACU. Bupivacaine 0.5% given, 30 ml. Excellent analgesia.   Complications: No apparent anesthesia complications

## 2015-08-28 NOTE — Op Note (Signed)
08/28/2015  12:41 PM  PATIENT:  Hector Green    PRE-OPERATIVE DIAGNOSIS:  Dehiscence Right Below Knee Amputation  POST-OPERATIVE DIAGNOSIS:  Same  PROCEDURE:  Revision Right Below Knee Amputation  SURGEON:  Nadara Mustard, MD  PHYSICIAN ASSISTANT:None ANESTHESIA:   General  PREOPERATIVE INDICATIONS:  [name] is a  54 y.o. male with a diagnosis of Dehiscence Right Below Knee Amputation who failed conservative measures and elected for surgical management.    The risks benefits and alternatives were discussed with the patient preoperatively including but not limited to the risks of infection, bleeding, nerve injury, cardiopulmonary complications, the need for revision surgery, among others, and the patient was willing to proceed.  OPERATIVE IMPLANTS: None  OPERATIVE FINDINGS: Minimal petechial bleeding  OPERATIVE PROCEDURE: Patient was brought to the operating room and underwent a general anesthesia aesthetic. After adequate levels anesthesia obtained patient's right lower extremity was prepped using DuraPrep draped into a sterile field. A timeout was called. An elliptical incision was made around the necrotic tissue. All necrotic muscle was excised the distal 2 cm of the tibia and fibula were resected. Electrocautery was used for hemostasis. The incision was closed using 2-0 nylon there was no tension on the skin. A sterile compressive dressing was applied. Patient was extubated taken to the PACU in stable condition plan for discharge to home.

## 2015-08-28 NOTE — H&P (Signed)
Hector Green is an 54 y.o. male.   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  Past Medical History  Diagnosis Date  . CAD (coronary artery disease) 2011    Multivessel s/p CABG in [place]  . Type 2 diabetes mellitus   . Hyperlipidemia   . Peripheral arterial disease   . Abnormal nuclear stress test 05/06/2015  . Myocardial infarction 2011  . Paranoid schizophrenia   . Essential hypertension     not on medications at this time    Past Surgical History  Procedure Laterality Date  . Coronary artery bypass graft  2011    Three vessel by report  . Abdominal aortagram N/A 03/30/2015    Procedure: ABDOMINAL [name];  Surgeon: Fransisco Hertz, MD;  Location: [facility] CATH LAB;  Service: Cardiovascular;  Laterality: N/A;  . Lower extremity angiogram N/A 03/30/2015    Procedure: LOWER EXTREMITY ANGIOGRAM;  Surgeon: Fransisco Hertz, MD;  Location: [facility] CATH LAB;  Service: Cardiovascular;  Laterality: N/A;  . Cardiac catheterization N/A 05/06/2015    Procedure: Left Heart Cath and Cors/Grafts Angiography;  Surgeon: Tonny Bollman, MD;  Location: [facility] INVASIVE CV LAB;  Service: Cardiovascular;  Laterality: N/A;  . Femoral-tibial bypass graft Right 05/13/2015    Procedure: BYPASS GRAFT RIGHT FEMORAL-POSTERIOR TIBIAL ARTERY USING LEFT NONREVERSED TRANSLOCATED GREATER SAPPHENOUS VEIN;  Surgeon: Pryor Ochoa, MD;  Location: [facility] OR;  Service: Vascular;  Laterality: Right;  . Vein harvest Left 05/13/2015    Procedure: LEFT GREATER SAPPHENOUS VEIN HARVEST;  Surgeon: Pryor Ochoa, MD;  Location: [facility] OR;  Service: Vascular;  Laterality: Left;  . Endarterectomy femoral Right 05/13/2015    Procedure: RIGHT COMMON FEMORAL, SUPERFICIAL FEMORAL AND PROFUNDA ENDARTERECTOMY ;  Surgeon: Pryor Ochoa, MD;  Location: [facility] OR;  Service: Vascular;  Laterality:  Right;  . Patch angioplasty Right 05/13/2015    Procedure: RIGHT FEMORAL ARTERY PATCH ANGIOPLASTY;  Surgeon: Pryor Ochoa, MD;  Location: [facility] OR;  Service: Vascular;  Laterality: Right;  . Intraoperative arteriogram Right 05/13/2015    Procedure: RIGHT LOWER LEG INTRA OPERATIVE ARTERIOGRAM;  Surgeon: Pryor Ochoa, MD;  Location: [facility] OR;  Service: Vascular;  Laterality: Right;  . Thrombectomy femoral artery Right 05/13/2015    Procedure: THROMBECTOMY RIGHT FEMORAL-PROXIMAL POSTERIOR TIBAIL ARTERY BYPASS GRAFT;  Surgeon: Larina Earthly, MD;  Location: [facility] OR;  Service: Vascular;  Laterality: Right;  . Colonoscopy    . Femoral-tibial bypass graft Right 05/27/2015    Procedure: THROMBECTOMY OF RIGHT FEMORAL-POSTERIOR TIBIAL ARTERY SAPHENOUS VEIN BYPASS GRAFT ;  Surgeon: Pryor Ochoa, MD;  Location: [facility] OR;  Service: Vascular;  Laterality: Right;  . Intraoperative arteriogram Right 05/27/2015    Procedure: INTRA OPERATIVE ARTERIOGRAM;  Surgeon: Pryor Ochoa, MD;  Location: [facility] OR;  Service: Vascular;  Laterality: Right;  . 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 Lajoyce Corners  . Amputation Right 07/08/2015    Procedure: AMPUTATION BELOW RIGHT KNEE;  Surgeon: Nadara Mustard, MD;  Location: [facility] OR;  Service: Orthopedics;  Laterality: Right;    Family History  Problem Relation Age of Onset  . Cancer Father    Social History:  reports that he has been smoking Cigarettes.  He started smoking about 40 years ago. He has a 7.5 pack-year smoking history. He has never used smokeless tobacco. He reports that he does not  drink alcohol or use illicit drugs.  Allergies:  Allergies  Allergen Reactions  . Fish-Derived Products Anaphylaxis  . Penicillins Anaphylaxis    No prescriptions prior to admission    No results found for this or any previous visit (from the past 48 hour(s)). No results found.  Review of Systems  All other systems reviewed and are negative.   There were no vitals taken for this  visit. Physical Exam   On examination there is dehiscence of the transtibial amputation. Assessment/Plan Assessment: Dehiscence right transtibial amputation.  Plan: We'll plan for revision of the amputation. Risks and benefits were discussed including risk of the wound not healing. Patient states he understands and wishes to proceed at this time.  DUDA,MARCUS V 08/28/2015, 7:13 AM

## 2015-08-28 NOTE — Anesthesia Procedure Notes (Signed)
Procedure Name: LMA Insertion Date/Time: 08/28/2015 12:13 PM Performed by: Fanny Dance L Pre-anesthesia Checklist: Patient identified, Emergency Drugs available, Suction available and Patient being monitored Patient Re-evaluated:Patient Re-evaluated prior to inductionOxygen Delivery Method: Circle system utilized Preoxygenation: Pre-oxygenation with 100% oxygen Intubation Type: IV induction Ventilation: Mask ventilation without difficulty LMA: LMA inserted LMA Size: 5.0 Number of attempts: 1 Placement Confirmation: positive ETCO2,  CO2 detector and breath sounds checked- equal and bilateral Tube secured with: Tape

## 2015-08-28 NOTE — Progress Notes (Signed)
Pt. Reports " I have so many pills, but I don't take them all the time". Looking at a prior med history he reported that he had Lipitor & Amaryl but was not taking them.

## 2015-08-28 NOTE — Transfer of Care (Signed)
Immediate Anesthesia Transfer of Care Note  Patient: Hector Green  Procedure(s) Performed: Procedure(s): Revision Right Below Knee Amputation (Right)  Patient Location: PACU  Anesthesia Type:General  Level of Consciousness: awake  Airway & Oxygen Therapy: Patient Spontanous Breathing and Patient connected to nasal cannula oxygen  Post-op Assessment: Report given to RN and Post -op Vital signs reviewed and stable  Post vital signs: stable  Last Vitals:  Filed Vitals:   08/28/15 1245  BP: 114/72  Pulse: 92  Temp:   Resp: 18    Complications: No apparent anesthesia complications

## 2015-08-28 NOTE — Anesthesia Preprocedure Evaluation (Signed)
Anesthesia Evaluation  Patient identified by MRN, date of birth, ID band Patient awake    Reviewed: Allergy & Precautions, H&P , NPO status , Patient's Chart, lab work & pertinent test results  Airway Mallampati: II  TM Distance: >3 FB Neck ROM: Full    Dental no notable dental hx. (+) Teeth Intact, Dental Advisory Given   Pulmonary Current Smoker,    Pulmonary exam normal breath sounds clear to auscultation       Cardiovascular hypertension, Pt. on medications + CAD, + Past MI, + CABG and + Peripheral Vascular Disease   Rhythm:Regular Rate:Normal     Neuro/Psych negative neurological ROS     GI/Hepatic negative GI ROS, Neg liver ROS,   Endo/Other  diabetes, Type 2, Oral Hypoglycemic Agents  Renal/GU      Musculoskeletal   Abdominal   Peds  Hematology negative hematology ROS (+)   Anesthesia Other Findings   Reproductive/Obstetrics negative OB ROS                             Anesthesia Physical Anesthesia Plan  ASA: III  Anesthesia Plan: General   Post-op Pain Management:    Induction: Intravenous  Airway Management Planned: LMA  Additional Equipment:   Intra-op Plan:   Post-operative Plan: Extubation in OR  Informed Consent: I have reviewed the patients History and Physical, chart, labs and discussed the procedure including the risks, benefits and alternatives for the proposed anesthesia with the patient or authorized representative who has indicated his/her understanding and acceptance.   Dental advisory given  Plan Discussed with: CRNA and Surgeon  Anesthesia Plan Comments:         Anesthesia Quick Evaluation

## 2015-08-31 ENCOUNTER — Encounter: Payer: Self-pay | Admitting: Vascular Surgery

## 2015-08-31 ENCOUNTER — Encounter (HOSPITAL_COMMUNITY): Payer: Self-pay | Admitting: Orthopedic Surgery

## 2015-08-31 ENCOUNTER — Encounter (HOSPITAL_COMMUNITY): Payer: Medicare Other

## 2015-08-31 ENCOUNTER — Other Ambulatory Visit (HOSPITAL_COMMUNITY): Payer: Medicare Other

## 2015-09-01 ENCOUNTER — Ambulatory Visit: Payer: Medicare Other | Admitting: Vascular Surgery

## 2015-09-03 DIAGNOSIS — Z4781 Encounter for orthopedic aftercare following surgical amputation: Secondary | ICD-10-CM | POA: Diagnosis not present

## 2015-09-03 DIAGNOSIS — I10 Essential (primary) hypertension: Secondary | ICD-10-CM | POA: Diagnosis not present

## 2015-09-03 DIAGNOSIS — I2510 Atherosclerotic heart disease of native coronary artery without angina pectoris: Secondary | ICD-10-CM | POA: Diagnosis not present

## 2015-09-03 DIAGNOSIS — I739 Peripheral vascular disease, unspecified: Secondary | ICD-10-CM | POA: Diagnosis not present

## 2015-09-03 DIAGNOSIS — Z89511 Acquired absence of right leg below knee: Secondary | ICD-10-CM | POA: Diagnosis not present

## 2015-09-03 DIAGNOSIS — E119 Type 2 diabetes mellitus without complications: Secondary | ICD-10-CM | POA: Diagnosis not present

## 2015-09-08 ENCOUNTER — Telehealth: Payer: Self-pay | Admitting: *Deleted

## 2015-09-08 DIAGNOSIS — I739 Peripheral vascular disease, unspecified: Secondary | ICD-10-CM | POA: Diagnosis not present

## 2015-09-08 DIAGNOSIS — I2510 Atherosclerotic heart disease of native coronary artery without angina pectoris: Secondary | ICD-10-CM | POA: Diagnosis not present

## 2015-09-08 DIAGNOSIS — Z89511 Acquired absence of right leg below knee: Secondary | ICD-10-CM | POA: Diagnosis not present

## 2015-09-08 DIAGNOSIS — E119 Type 2 diabetes mellitus without complications: Secondary | ICD-10-CM | POA: Diagnosis not present

## 2015-09-08 DIAGNOSIS — Z4781 Encounter for orthopedic aftercare following surgical amputation: Secondary | ICD-10-CM | POA: Diagnosis not present

## 2015-09-08 DIAGNOSIS — I10 Essential (primary) hypertension: Secondary | ICD-10-CM | POA: Diagnosis not present

## 2015-09-08 NOTE — Telephone Encounter (Signed)
Called [name] at [facility] woth orders, she will notify pt

## 2015-09-08 NOTE — Telephone Encounter (Signed)
INR 3.3 Takes  qd

## 2015-09-08 NOTE — Telephone Encounter (Signed)
Hold Coumadin for one day then continue current schedule but take only 7.5 mg on weekend days repeat PT/INR in 1 week

## 2015-09-09 DIAGNOSIS — I252 Old myocardial infarction: Secondary | ICD-10-CM | POA: Diagnosis not present

## 2015-09-09 DIAGNOSIS — I10 Essential (primary) hypertension: Secondary | ICD-10-CM | POA: Diagnosis not present

## 2015-09-09 DIAGNOSIS — I739 Peripheral vascular disease, unspecified: Secondary | ICD-10-CM | POA: Diagnosis not present

## 2015-09-09 DIAGNOSIS — I2510 Atherosclerotic heart disease of native coronary artery without angina pectoris: Secondary | ICD-10-CM | POA: Diagnosis not present

## 2015-09-09 DIAGNOSIS — E119 Type 2 diabetes mellitus without complications: Secondary | ICD-10-CM | POA: Diagnosis not present

## 2015-09-09 DIAGNOSIS — Z7901 Long term (current) use of anticoagulants: Secondary | ICD-10-CM | POA: Diagnosis not present

## 2015-09-09 DIAGNOSIS — F17210 Nicotine dependence, cigarettes, uncomplicated: Secondary | ICD-10-CM | POA: Diagnosis not present

## 2015-09-09 DIAGNOSIS — Z951 Presence of aortocoronary bypass graft: Secondary | ICD-10-CM | POA: Diagnosis not present

## 2015-09-09 DIAGNOSIS — Z89511 Acquired absence of right leg below knee: Secondary | ICD-10-CM | POA: Diagnosis not present

## 2015-09-09 DIAGNOSIS — F200 Paranoid schizophrenia: Secondary | ICD-10-CM | POA: Diagnosis not present

## 2015-09-09 DIAGNOSIS — Z4781 Encounter for orthopedic aftercare following surgical amputation: Secondary | ICD-10-CM | POA: Diagnosis not present

## 2015-09-14 ENCOUNTER — Other Ambulatory Visit (HOSPITAL_COMMUNITY): Payer: Medicare Other

## 2015-09-14 ENCOUNTER — Encounter (HOSPITAL_COMMUNITY): Payer: Medicare Other

## 2015-09-15 DIAGNOSIS — I2510 Atherosclerotic heart disease of native coronary artery without angina pectoris: Secondary | ICD-10-CM | POA: Diagnosis not present

## 2015-09-15 DIAGNOSIS — E119 Type 2 diabetes mellitus without complications: Secondary | ICD-10-CM | POA: Diagnosis not present

## 2015-09-15 DIAGNOSIS — I10 Essential (primary) hypertension: Secondary | ICD-10-CM | POA: Diagnosis not present

## 2015-09-15 DIAGNOSIS — Z4781 Encounter for orthopedic aftercare following surgical amputation: Secondary | ICD-10-CM | POA: Diagnosis not present

## 2015-09-15 DIAGNOSIS — I739 Peripheral vascular disease, unspecified: Secondary | ICD-10-CM | POA: Diagnosis not present

## 2015-09-15 DIAGNOSIS — Z89511 Acquired absence of right leg below knee: Secondary | ICD-10-CM | POA: Diagnosis not present

## 2015-09-16 ENCOUNTER — Telehealth: Payer: Self-pay | Admitting: *Deleted

## 2015-09-16 ENCOUNTER — Ambulatory Visit (INDEPENDENT_AMBULATORY_CARE_PROVIDER_SITE_OTHER): Payer: Medicare Other | Admitting: Pharmacist

## 2015-09-16 DIAGNOSIS — I739 Peripheral vascular disease, unspecified: Secondary | ICD-10-CM | POA: Diagnosis not present

## 2015-09-16 LAB — POCT INR: INR: 2.9

## 2015-09-16 NOTE — Telephone Encounter (Signed)
Continue current warfarin dose of  Monday through Friday and 7.5mg  on sundays and satrudays. Tried to call patient but not at home - left message for him to call us.  Left message for Okey Regal with [facility] about dosage and order to recheck INR in 1 week.

## 2015-09-16 NOTE — Progress Notes (Signed)
INR checked by home health agency - billing once monthly interpretation fee

## 2015-09-16 NOTE — Telephone Encounter (Signed)
Protime 35.3  INR 2.9

## 2015-09-17 DIAGNOSIS — E119 Type 2 diabetes mellitus without complications: Secondary | ICD-10-CM | POA: Diagnosis not present

## 2015-09-17 DIAGNOSIS — I2510 Atherosclerotic heart disease of native coronary artery without angina pectoris: Secondary | ICD-10-CM | POA: Diagnosis not present

## 2015-09-17 DIAGNOSIS — I739 Peripheral vascular disease, unspecified: Secondary | ICD-10-CM | POA: Diagnosis not present

## 2015-09-17 DIAGNOSIS — I10 Essential (primary) hypertension: Secondary | ICD-10-CM | POA: Diagnosis not present

## 2015-09-17 DIAGNOSIS — Z89511 Acquired absence of right leg below knee: Secondary | ICD-10-CM | POA: Diagnosis not present

## 2015-09-17 DIAGNOSIS — Z4781 Encounter for orthopedic aftercare following surgical amputation: Secondary | ICD-10-CM | POA: Diagnosis not present

## 2015-09-23 ENCOUNTER — Other Ambulatory Visit: Payer: Self-pay | Admitting: Pharmacist

## 2015-09-23 ENCOUNTER — Telehealth: Payer: Self-pay | Admitting: *Deleted

## 2015-09-23 DIAGNOSIS — Z4781 Encounter for orthopedic aftercare following surgical amputation: Secondary | ICD-10-CM | POA: Diagnosis not present

## 2015-09-23 DIAGNOSIS — Z89511 Acquired absence of right leg below knee: Secondary | ICD-10-CM | POA: Diagnosis not present

## 2015-09-23 DIAGNOSIS — I739 Peripheral vascular disease, unspecified: Secondary | ICD-10-CM | POA: Diagnosis not present

## 2015-09-23 DIAGNOSIS — E119 Type 2 diabetes mellitus without complications: Secondary | ICD-10-CM | POA: Diagnosis not present

## 2015-09-23 DIAGNOSIS — I2510 Atherosclerotic heart disease of native coronary artery without angina pectoris: Secondary | ICD-10-CM | POA: Diagnosis not present

## 2015-09-23 DIAGNOSIS — I10 Essential (primary) hypertension: Secondary | ICD-10-CM | POA: Diagnosis not present

## 2015-09-24 ENCOUNTER — Other Ambulatory Visit: Payer: Self-pay | Admitting: *Deleted

## 2015-09-24 ENCOUNTER — Encounter: Payer: Self-pay | Admitting: Vascular Surgery

## 2015-09-24 MED ORDER — WARFARIN SODIUM 5 MG PO TABS: ORAL_TABLET | ORAL | Status: DC

## 2015-09-24 NOTE — Telephone Encounter (Signed)
INR is up to date, but he has never seen provider here. Didn't know who to order under

## 2015-09-28 ENCOUNTER — Ambulatory Visit (INDEPENDENT_AMBULATORY_CARE_PROVIDER_SITE_OTHER): Payer: Medicare Other | Admitting: Pharmacist

## 2015-09-28 ENCOUNTER — Encounter: Payer: Self-pay | Admitting: Pharmacist

## 2015-09-28 VITALS — BP 114/70 | HR 78

## 2015-09-28 DIAGNOSIS — I739 Peripheral vascular disease, unspecified: Secondary | ICD-10-CM | POA: Diagnosis not present

## 2015-09-28 DIAGNOSIS — E1169 Type 2 diabetes mellitus with other specified complication: Secondary | ICD-10-CM

## 2015-09-28 DIAGNOSIS — E118 Type 2 diabetes mellitus with unspecified complications: Secondary | ICD-10-CM | POA: Diagnosis not present

## 2015-09-28 DIAGNOSIS — E785 Hyperlipidemia, unspecified: Secondary | ICD-10-CM

## 2015-09-28 DIAGNOSIS — IMO0002 Reserved for concepts with insufficient information to code with codable children: Secondary | ICD-10-CM

## 2015-09-28 DIAGNOSIS — E1165 Type 2 diabetes mellitus with hyperglycemia: Secondary | ICD-10-CM | POA: Diagnosis not present

## 2015-09-28 DIAGNOSIS — I70269 Atherosclerosis of native arteries of extremities with gangrene, unspecified extremity: Secondary | ICD-10-CM

## 2015-09-28 LAB — POCT GLYCOSYLATED HEMOGLOBIN (HGB A1C): Hemoglobin A1C: 8.6

## 2015-09-28 LAB — POCT INR: INR: 1

## 2015-09-28 MED ORDER — WARFARIN SODIUM 5 MG PO TABS: ORAL_TABLET | ORAL | Status: DC

## 2015-09-28 MED ORDER — METFORMIN HCL 500 MG PO TABS: 500.0000 mg | ORAL_TABLET | Freq: Two times a day (BID) | ORAL | Status: DC

## 2015-09-28 NOTE — Patient Instructions (Addendum)
Diabetes and Standards of Medical Care   Diabetes is complicated. You may find that your diabetes team includes a dietitian, nurse, diabetes educator, eye doctor, and more. To help everyone know what is going on and to help you get the care you deserve, the following schedule of care was developed to help keep you on track. Below are the tests, exams, vaccines, medicines, education, and plans you will need.  Blood Glucose Goals Prior to meals = 80 - 130 Within 2 hours of the start of a meal = less than 180  HbA1c test (goal is less than 7.0% - your last value was 8.6% today - previsouly was 11.1% in June 2016 so it is improving) This test shows how well you have controlled your glucose over the past 2 to 3 months. It is used to see if your diabetes management plan needs to be adjusted.   It is performed at least 2 times a year if you are meeting treatment goals.  It is performed 4 times a year if therapy has changed or if you are not meeting treatment goals.  Blood pressure test  This test is performed at every routine medical visit. The goal is less than 140/90 mmHg for most people, but 130/80 mmHg in some cases. Ask your health care provider about your goal.  Dental exam  Follow up with the dentist regularly.  Eye exam  If you are diagnosed with type 1 diabetes as a child, get an exam upon reaching the age of 40 years or older and have had diabetes for 3 to 5 years. Yearly eye exams are recommended after that initial eye exam.  If you are diagnosed with type 1 diabetes as an adult, get an exam within 5 years of diagnosis and then yearly.  If you are diagnosed with type 2 diabetes, get an exam as soon as possible after the diagnosis and then yearly.  Foot care exam  Visual foot exams are performed at every routine medical visit. The exams check for cuts, injuries, or other problems with the feet.  A comprehensive foot exam should be done yearly. This includes visual inspection as  well as assessing foot pulses and testing for loss of sensation.  Check your feet nightly for cuts, injuries, or other problems with your feet. Tell your health care provider if anything is not healing.  Kidney function test (urine microalbumin)  This test is performed once a year.  Type 1 diabetes: The first test is performed 5 years after diagnosis.  Type 2 diabetes: The first test is performed at the time of diagnosis.  A serum creatinine and estimated glomerular filtration rate (eGFR) test is done once a year to assess the level of chronic kidney disease (CKD), if present.  Lipid profile (cholesterol, HDL, LDL, triglycerides)  Performed every 5 years for most people.  The goal for LDL is less than 100 mg/dL. If you are at high risk, the goal is less than 70 mg/dL.  The goal for HDL is 40 mg/dL to 50 mg/dL for men and 50 mg/dL to 60 mg/dL for women. An HDL cholesterol of 60 mg/dL or higher gives some protection against heart disease.  The goal for triglycerides is less than 150 mg/dL.  Influenza vaccine, pneumococcal vaccine, and hepatitis B vaccine  The influenza vaccine is recommended yearly.  The pneumococcal vaccine is generally given once in a lifetime. However, there are some instances when another vaccination is recommended. Check with your health care provider.  The hepatitis B vaccine is also recommended for adults with diabetes.  Diabetes self-management education  Education is recommended at diagnosis and ongoing as needed.  Treatment plan  Your treatment plan is reviewed at every medical visit.  Document Released: 09/25/2009 Document Revised: 07/31/2013 Document Reviewed: 04/30/2013 [facility] Patient Information 2014 [place].

## 2015-09-28 NOTE — Progress Notes (Signed)
  Subjective:    Hector Green is a 53 y.o. male who presents for follow-up of Type 2 diabetes mellitus.  Hector Green moved from [place] to be with his son about 6 months ago.  05/2015 he has right femoral posterior tibial artery by pass graft.  Subsequent experienced complications and 00/92/3300 has BNA of right leg with need of surgery for revision of BNA 08/28/2015.  Currently healing well.  Patient is taking metformin 51m 1 tablet daily for DM.  Reports that he checked BG daily   Known diabetic complications: cardiovascular disease and peripheral vascular disease Cardiovascular risk factors: diabetes mellitus, dyslipidemia, family history of premature cardiovascular disease, male gender and smoking/ tobacco exposure   [place]within one [place] to track down information to request records [place] optometrist [place] Weight trend: decreasing steadily Prior visit with dietician: no Current diet: patient reports he has changed diet and if following CHO counting diet.  Current exercise: physical therapy weekly for BKA  Current monitoring regimen: home blood tests - 1 - 2  times daily Home blood sugar records: ranges from 140's to 180's  Any episodes of hypoglycemia? no  Is He on ACE inhibitor or angiotensin II receptor blocker?  No    The following portions of the patient's history were reviewed and updated as appropriate: allergies, current medications, past family history, past medical history, past social history, past surgical history and problem list.   Objective:    BP 114/70 mmHg  Pulse 78   A1c = 8.6% today A1c = 11.1% (05/27/2015)  Lab Review GLUCOSE, BLD (mg/dL)  Date Value  08/10/2015 248*  07/08/2015 293*  05/28/2015 206*   CO2 (mmol/L)  Date Value  08/10/2015 26  07/08/2015 23  05/28/2015 26   BUN (mg/dL)  Date Value  08/10/2015 30*  07/08/2015 22*  05/28/2015 9   CREAT (mg/dL)  Date Value   04/29/2015 0.96   CREATININE, SER (mg/dL)  Date Value  08/10/2015 0.75  07/08/2015 0.94  05/28/2015 0.89    Assessment:    Diabetes Mellitus type II, under inadequate but improving control.    Plan:    1.  Rx changes: increase metformin to 5090m1 tablet bid 2.  Education: Reviewed 'ABCs' of diabetes management (respective goals in parentheses):  A1C (<7), blood pressure (<130/80), and cholesterol (LDL <100). 3.  Continue CHO counting diet 4.  Continue to check BG once daily. 5.   Anticoagulation Dose Instructions as of 09/28/2015      SuDorene Grebeue Wed Thu Fri Sat   New Dose 10 _0  mg 10 mg    Description        Take 3 tablets today - Monday, October 17th, then restart warfarin 60m14m take 2 tablets (= 10 mg) daily       Recheck INR in 3 days - will order for home health to check at their next visit.  6.  RTC to see PCP in 3 months.   Orders Placed This Encounter  Procedures  . CMP14+EGFR  . Lipid panel  . LDL cholesterol, direct  . POCT glycosylated hemoglobin (Hb A1C)   TamCherre RobinsharmD, CPP, CDE

## 2015-09-29 ENCOUNTER — Ambulatory Visit: Payer: Medicare Other | Admitting: Vascular Surgery

## 2015-09-29 DIAGNOSIS — Z89511 Acquired absence of right leg below knee: Secondary | ICD-10-CM | POA: Diagnosis not present

## 2015-09-29 DIAGNOSIS — Z4781 Encounter for orthopedic aftercare following surgical amputation: Secondary | ICD-10-CM | POA: Diagnosis not present

## 2015-09-29 DIAGNOSIS — I739 Peripheral vascular disease, unspecified: Secondary | ICD-10-CM | POA: Diagnosis not present

## 2015-09-29 DIAGNOSIS — I10 Essential (primary) hypertension: Secondary | ICD-10-CM | POA: Diagnosis not present

## 2015-09-29 DIAGNOSIS — I2510 Atherosclerotic heart disease of native coronary artery without angina pectoris: Secondary | ICD-10-CM | POA: Diagnosis not present

## 2015-09-29 DIAGNOSIS — E119 Type 2 diabetes mellitus without complications: Secondary | ICD-10-CM | POA: Diagnosis not present

## 2015-09-29 LAB — LIPID PANEL
CHOL/HDL RATIO: 4.8 ratio (ref 0.0–5.0)
CHOLESTEROL TOTAL: 219 mg/dL — AB (ref 100–199)
HDL: 46 mg/dL (ref >39)
LDL Calculated: 144 mg/dL — ABNORMAL HIGH (ref 0–99)
Triglycerides: 146 mg/dL (ref 0–149)
VLDL Cholesterol Cal: 29 mg/dL (ref 5–40)

## 2015-09-29 LAB — CMP14+EGFR
ALBUMIN: 4.1 g/dL (ref 3.5–5.5)
ALK PHOS: 74 IU/L (ref 39–117)
ALT: 8 IU/L (ref 0–44)
AST: 8 IU/L (ref 0–40)
Albumin/Globulin Ratio: 1.9 (ref 1.1–2.5)
BILIRUBIN TOTAL: 0.2 mg/dL (ref 0.0–1.2)
BUN / CREAT RATIO: 21 — AB (ref 9–20)
BUN: 15 mg/dL (ref 6–24)
CHLORIDE: 99 mmol/L (ref 97–106)
CO2: 23 mmol/L (ref 18–29)
Calcium: 9.8 mg/dL (ref 8.7–10.2)
Creatinine, Ser: 0.72 mg/dL — ABNORMAL LOW (ref 0.76–1.27)
GFR calc Af Amer: 123 mL/min/{1.73_m2} (ref >59)
GFR calc non Af Amer: 107 mL/min/{1.73_m2} (ref >59)
GLOBULIN, TOTAL: 2.2 g/dL (ref 1.5–4.5)
Glucose: 258 mg/dL — ABNORMAL HIGH (ref 65–99)
POTASSIUM: 5.8 mmol/L — AB (ref 3.5–5.2)
SODIUM: 138 mmol/L (ref 136–144)
Total Protein: 6.3 g/dL (ref 6.0–8.5)

## 2015-09-29 LAB — LDL CHOLESTEROL, DIRECT: LDL DIRECT: 158 mg/dL — AB (ref 0–99)

## 2015-10-02 ENCOUNTER — Telehealth: Payer: Self-pay | Admitting: *Deleted

## 2015-10-02 ENCOUNTER — Other Ambulatory Visit: Payer: Self-pay | Admitting: Pharmacist

## 2015-10-02 DIAGNOSIS — I10 Essential (primary) hypertension: Secondary | ICD-10-CM | POA: Diagnosis not present

## 2015-10-02 DIAGNOSIS — I739 Peripheral vascular disease, unspecified: Secondary | ICD-10-CM | POA: Diagnosis not present

## 2015-10-02 DIAGNOSIS — Z4781 Encounter for orthopedic aftercare following surgical amputation: Secondary | ICD-10-CM | POA: Diagnosis not present

## 2015-10-02 DIAGNOSIS — I2510 Atherosclerotic heart disease of native coronary artery without angina pectoris: Secondary | ICD-10-CM | POA: Diagnosis not present

## 2015-10-02 DIAGNOSIS — Z89511 Acquired absence of right leg below knee: Secondary | ICD-10-CM | POA: Diagnosis not present

## 2015-10-02 DIAGNOSIS — E119 Type 2 diabetes mellitus without complications: Secondary | ICD-10-CM | POA: Diagnosis not present

## 2015-10-02 MED ORDER — ATORVASTATIN CALCIUM 40 MG PO TABS: 40.0000 mg | ORAL_TABLET | Freq: Every day | ORAL | Status: DC

## 2015-10-02 NOTE — Telephone Encounter (Signed)
Tried to call patient but he was not available.  I was able to speak to his niece / nephew's wife.  She states that patient picked up warfarin, metformin and atorvastatin today.  He is committed to taking them regularly.

## 2015-10-02 NOTE — Telephone Encounter (Signed)
Compliance issues discussed with [name] with [facility].  Patient has been non compliant with meds and follow up appts with Dr Lajoyce Cornersuda.   Reasured Lynden AngCathy that I will discuss consequences of not taking meds as directed.  I also ordered BMET to be checked next week along with PT/INR since potassium was elevated at last visit .

## 2015-10-02 NOTE — Telephone Encounter (Signed)
INR 0.9 He says he is getting coumadin today, still hasn't had any in about two weeks, says he financially has not been able to buy

## 2015-10-09 ENCOUNTER — Telehealth: Payer: Self-pay | Admitting: *Deleted

## 2015-10-09 ENCOUNTER — Ambulatory Visit (INDEPENDENT_AMBULATORY_CARE_PROVIDER_SITE_OTHER): Payer: Medicare Other | Admitting: Pharmacist

## 2015-10-09 DIAGNOSIS — I739 Peripheral vascular disease, unspecified: Secondary | ICD-10-CM | POA: Diagnosis not present

## 2015-10-09 DIAGNOSIS — I2510 Atherosclerotic heart disease of native coronary artery without angina pectoris: Secondary | ICD-10-CM | POA: Diagnosis not present

## 2015-10-09 DIAGNOSIS — E119 Type 2 diabetes mellitus without complications: Secondary | ICD-10-CM | POA: Diagnosis not present

## 2015-10-09 DIAGNOSIS — Z89511 Acquired absence of right leg below knee: Secondary | ICD-10-CM | POA: Diagnosis not present

## 2015-10-09 DIAGNOSIS — Z4781 Encounter for orthopedic aftercare following surgical amputation: Secondary | ICD-10-CM | POA: Diagnosis not present

## 2015-10-09 DIAGNOSIS — I10 Essential (primary) hypertension: Secondary | ICD-10-CM | POA: Diagnosis not present

## 2015-10-09 LAB — POCT INR: INR: 2.2

## 2015-10-09 NOTE — Telephone Encounter (Signed)
INR 2.2  Patient refused to have Vit. K drawn

## 2015-10-09 NOTE — Telephone Encounter (Signed)
Patient was suppose to have potassium / K+ drawn today.  INR is therapeutic - continue warfarin 5mg  - take 2 tablets daily.   Spoke with Okey Regalarol and she will draw BMET next week and recheck INR next week.   Patient notified and understand warfarin directions and agrees to BMET next week

## 2015-10-09 NOTE — Progress Notes (Signed)
INR checked by home health nurse - no charge for labs or visit today.

## 2015-10-14 ENCOUNTER — Ambulatory Visit: Payer: Self-pay | Admitting: Pharmacist

## 2015-10-14 ENCOUNTER — Telehealth: Payer: Self-pay | Admitting: *Deleted

## 2015-10-14 DIAGNOSIS — I2510 Atherosclerotic heart disease of native coronary artery without angina pectoris: Secondary | ICD-10-CM | POA: Diagnosis not present

## 2015-10-14 DIAGNOSIS — I739 Peripheral vascular disease, unspecified: Secondary | ICD-10-CM | POA: Diagnosis not present

## 2015-10-14 DIAGNOSIS — I10 Essential (primary) hypertension: Secondary | ICD-10-CM | POA: Diagnosis not present

## 2015-10-14 DIAGNOSIS — E119 Type 2 diabetes mellitus without complications: Secondary | ICD-10-CM | POA: Diagnosis not present

## 2015-10-14 DIAGNOSIS — Z4781 Encounter for orthopedic aftercare following surgical amputation: Secondary | ICD-10-CM | POA: Diagnosis not present

## 2015-10-14 DIAGNOSIS — Z89511 Acquired absence of right leg below knee: Secondary | ICD-10-CM | POA: Diagnosis not present

## 2015-10-14 LAB — POCT INR: INR: 2.1

## 2015-10-14 NOTE — Telephone Encounter (Signed)
Therapeutic anticoagulation Continue current warfarin dose of 10mg  daily. Per Lynden Angathy patient continues to refuse to have blood drawn to recheck potassium [name] Left message for patient about results and encouraged to have potassium rechcked / allow for blood draw

## 2015-10-14 NOTE — Telephone Encounter (Signed)
INR 2.1

## 2015-10-15 NOTE — Telephone Encounter (Signed)
finished

## 2015-10-21 ENCOUNTER — Ambulatory Visit (INDEPENDENT_AMBULATORY_CARE_PROVIDER_SITE_OTHER): Payer: Medicare Other | Admitting: Pharmacist

## 2015-10-21 ENCOUNTER — Telehealth: Payer: Self-pay | Admitting: *Deleted

## 2015-10-21 DIAGNOSIS — I739 Peripheral vascular disease, unspecified: Secondary | ICD-10-CM

## 2015-10-21 DIAGNOSIS — I10 Essential (primary) hypertension: Secondary | ICD-10-CM | POA: Diagnosis not present

## 2015-10-21 DIAGNOSIS — I2510 Atherosclerotic heart disease of native coronary artery without angina pectoris: Secondary | ICD-10-CM | POA: Diagnosis not present

## 2015-10-21 DIAGNOSIS — E119 Type 2 diabetes mellitus without complications: Secondary | ICD-10-CM | POA: Diagnosis not present

## 2015-10-21 DIAGNOSIS — Z4781 Encounter for orthopedic aftercare following surgical amputation: Secondary | ICD-10-CM | POA: Diagnosis not present

## 2015-10-21 DIAGNOSIS — Z89511 Acquired absence of right leg below knee: Secondary | ICD-10-CM | POA: Diagnosis not present

## 2015-10-21 LAB — POCT INR: INR: 3.1

## 2015-10-21 NOTE — Progress Notes (Signed)
INR checked in patient's home by home health agency

## 2015-10-21 NOTE — Telephone Encounter (Signed)
Slightly supratherapeutic anticoagulation.  Decrease warfarin dose to 1 tablet (5mg ) on Wednesdays and 2 tablets (10mg ) all other days.

## 2015-10-21 NOTE — Telephone Encounter (Signed)
Protime 36.6  INR 3.1

## 2015-10-23 ENCOUNTER — Other Ambulatory Visit (HOSPITAL_COMMUNITY): Payer: Self-pay | Admitting: Orthopedic Surgery

## 2015-10-26 ENCOUNTER — Ambulatory Visit (INDEPENDENT_AMBULATORY_CARE_PROVIDER_SITE_OTHER): Payer: Medicare Other | Admitting: Pharmacist

## 2015-10-26 ENCOUNTER — Telehealth: Payer: Self-pay | Admitting: *Deleted

## 2015-10-26 DIAGNOSIS — E119 Type 2 diabetes mellitus without complications: Secondary | ICD-10-CM | POA: Diagnosis not present

## 2015-10-26 DIAGNOSIS — Z4781 Encounter for orthopedic aftercare following surgical amputation: Secondary | ICD-10-CM | POA: Diagnosis not present

## 2015-10-26 DIAGNOSIS — Z89511 Acquired absence of right leg below knee: Secondary | ICD-10-CM | POA: Diagnosis not present

## 2015-10-26 DIAGNOSIS — I2510 Atherosclerotic heart disease of native coronary artery without angina pectoris: Secondary | ICD-10-CM | POA: Diagnosis not present

## 2015-10-26 DIAGNOSIS — I739 Peripheral vascular disease, unspecified: Secondary | ICD-10-CM | POA: Diagnosis not present

## 2015-10-26 DIAGNOSIS — I10 Essential (primary) hypertension: Secondary | ICD-10-CM | POA: Diagnosis not present

## 2015-10-26 LAB — POCT INR: INR: 3.1

## 2015-10-26 NOTE — Telephone Encounter (Signed)
Spoke with Autumn at Dr Audrie Liauda's office and she states that patient does not need to hold warfarin prior to procedure on Friday.

## 2015-10-26 NOTE — Telephone Encounter (Signed)
Protime 36.8  INR 3.1 

## 2015-10-26 NOTE — Telephone Encounter (Signed)
[facility] current [facility]farin dose is 10mg  daily except 5mg  on Wednesdays.  Per Lynden Angathy patient is to go back in for surgery this Friday 10/30/15 with Dr Lajoyce Cornersuda.   I have contacted Dr Audrie Liauda's office to find out his preference and about holding warfarin.  Left message for his assistant for further instruction

## 2015-10-26 NOTE — Telephone Encounter (Signed)
Recommend no warfarin tomorrow (he had already taken today's dose), then decrease dose 1 tablet on Mondays and Wednesdays and 2 tablets all other days. Patient notified and [facility]

## 2015-10-27 NOTE — Addendum Note (Signed)
Encounter addended by: Tawni MillersPhyllis T Kofi Murrell, RN on: 10/27/2015 10:21 AM<BR>     Documentation filed: Result Entry, Scan

## 2015-10-29 ENCOUNTER — Encounter (HOSPITAL_COMMUNITY): Payer: Self-pay | Admitting: *Deleted

## 2015-10-29 MED ORDER — CLINDAMYCIN PHOSPHATE 900 MG/50ML IV SOLN
900.0000 mg | INTRAVENOUS | Status: AC
  Administered 2015-10-30: 900 mg via INTRAVENOUS
  Filled 2015-10-29: qty 50

## 2015-10-29 MED ORDER — CHLORHEXIDINE GLUCONATE 4 % EX LIQD: 60.0000 mL | Freq: Once | CUTANEOUS | Status: DC

## 2015-10-30 ENCOUNTER — Ambulatory Visit (HOSPITAL_COMMUNITY): Payer: Medicare Other | Admitting: Anesthesiology

## 2015-10-30 ENCOUNTER — Ambulatory Visit (HOSPITAL_COMMUNITY)
Admission: RE | Admit: 2015-10-30 | Discharge: 2015-10-30 | Disposition: A | Discharge status: Home / Self Care | Payer: Medicare Other | Source: Ambulatory Visit | Attending: Orthopedic Surgery | Admitting: Orthopedic Surgery

## 2015-10-30 ENCOUNTER — Encounter (HOSPITAL_COMMUNITY): Payer: Self-pay | Admitting: *Deleted

## 2015-10-30 ENCOUNTER — Encounter (HOSPITAL_COMMUNITY)
Admission: RE | Disposition: A | Discharge status: Home / Self Care | Payer: Self-pay | Source: Ambulatory Visit | Attending: Orthopedic Surgery

## 2015-10-30 DIAGNOSIS — Z89511 Acquired absence of right leg below knee: Secondary | ICD-10-CM | POA: Diagnosis not present

## 2015-10-30 DIAGNOSIS — T8781 Dehiscence of amputation stump: Secondary | ICD-10-CM | POA: Insufficient documentation

## 2015-10-30 DIAGNOSIS — I70261 Atherosclerosis of native arteries of extremities with gangrene, right leg: Secondary | ICD-10-CM | POA: Diagnosis not present

## 2015-10-30 DIAGNOSIS — I252 Old myocardial infarction: Secondary | ICD-10-CM | POA: Diagnosis not present

## 2015-10-30 DIAGNOSIS — F17200 Nicotine dependence, unspecified, uncomplicated: Secondary | ICD-10-CM | POA: Diagnosis not present

## 2015-10-30 DIAGNOSIS — Z7984 Long term (current) use of oral hypoglycemic drugs: Secondary | ICD-10-CM | POA: Diagnosis not present

## 2015-10-30 DIAGNOSIS — F17210 Nicotine dependence, cigarettes, uncomplicated: Secondary | ICD-10-CM | POA: Insufficient documentation

## 2015-10-30 DIAGNOSIS — Y838 Other surgical procedures as the cause of abnormal reaction of the patient, or of later complication, without mention of misadventure at the time of the procedure: Secondary | ICD-10-CM | POA: Insufficient documentation

## 2015-10-30 DIAGNOSIS — Z4801 Encounter for change or removal of surgical wound dressing: Secondary | ICD-10-CM | POA: Diagnosis not present

## 2015-10-30 DIAGNOSIS — Z951 Presence of aortocoronary bypass graft: Secondary | ICD-10-CM | POA: Insufficient documentation

## 2015-10-30 DIAGNOSIS — E785 Hyperlipidemia, unspecified: Secondary | ICD-10-CM | POA: Insufficient documentation

## 2015-10-30 DIAGNOSIS — Z880 Allergy status to penicillin: Secondary | ICD-10-CM | POA: Insufficient documentation

## 2015-10-30 DIAGNOSIS — T8751 Necrosis of amputation stump, right upper extremity: Secondary | ICD-10-CM | POA: Diagnosis not present

## 2015-10-30 DIAGNOSIS — E119 Type 2 diabetes mellitus without complications: Secondary | ICD-10-CM | POA: Insufficient documentation

## 2015-10-30 DIAGNOSIS — I2510 Atherosclerotic heart disease of native coronary artery without angina pectoris: Secondary | ICD-10-CM | POA: Insufficient documentation

## 2015-10-30 DIAGNOSIS — I10 Essential (primary) hypertension: Secondary | ICD-10-CM | POA: Insufficient documentation

## 2015-10-30 DIAGNOSIS — T8189XA Other complications of procedures, not elsewhere classified, initial encounter: Secondary | ICD-10-CM | POA: Diagnosis not present

## 2015-10-30 DIAGNOSIS — Z7901 Long term (current) use of anticoagulants: Secondary | ICD-10-CM | POA: Diagnosis not present

## 2015-10-30 HISTORY — PX: STUMP REVISION: SHX6102

## 2015-10-30 LAB — COMPREHENSIVE METABOLIC PANEL
ALK PHOS: 70 U/L (ref 38–126)
ALT: 37 U/L (ref 17–63)
AST: 25 U/L (ref 15–41)
Albumin: 3.2 g/dL — ABNORMAL LOW (ref 3.5–5.0)
Anion gap: 8 (ref 5–15)
BILIRUBIN TOTAL: 0.2 mg/dL — AB (ref 0.3–1.2)
BUN: 10 mg/dL (ref 6–20)
CALCIUM: 9 mg/dL (ref 8.9–10.3)
CO2: 23 mmol/L (ref 22–32)
CREATININE: 0.65 mg/dL (ref 0.61–1.24)
Chloride: 107 mmol/L (ref 101–111)
GFR calc Af Amer: >60 mL/min (ref >60)
GFR calc non Af Amer: >60 mL/min (ref >60)
GLUCOSE: 170 mg/dL — AB (ref 65–99)
POTASSIUM: 3.9 mmol/L (ref 3.5–5.1)
Sodium: 138 mmol/L (ref 135–145)
TOTAL PROTEIN: 5.6 g/dL — AB (ref 6.5–8.1)

## 2015-10-30 LAB — APTT: aPTT: 34 seconds (ref 24–37)

## 2015-10-30 LAB — GLUCOSE, CAPILLARY
GLUCOSE-CAPILLARY: 123 mg/dL — AB (ref 65–99)
Glucose-Capillary: 138 mg/dL — ABNORMAL HIGH (ref 65–99)

## 2015-10-30 LAB — CBC
HEMATOCRIT: 39.6 % (ref 39.0–52.0)
Hemoglobin: 13.3 g/dL (ref 13.0–17.0)
MCH: 28.9 pg (ref 26.0–34.0)
MCHC: 33.6 g/dL (ref 30.0–36.0)
MCV: 85.9 fL (ref 78.0–100.0)
Platelets: 145 10*3/uL — ABNORMAL LOW (ref 150–400)
RBC: 4.61 MIL/uL (ref 4.22–5.81)
RDW: 13.6 % (ref 11.5–15.5)
WBC: 9.7 10*3/uL (ref 4.0–10.5)

## 2015-10-30 LAB — PROTIME-INR
INR: 1.96 — ABNORMAL HIGH (ref 0.00–1.49)
Prothrombin Time: 22.2 seconds — ABNORMAL HIGH (ref 11.6–15.2)

## 2015-10-30 SURGERY — REVISION, AMPUTATION SITE
Anesthesia: General | Site: Leg Lower | Laterality: Right

## 2015-10-30 MED ORDER — ONDANSETRON HCL 4 MG/2ML IJ SOLN
INTRAMUSCULAR | Status: AC
  Filled 2015-10-30: qty 4

## 2015-10-30 MED ORDER — FENTANYL CITRATE (PF) 250 MCG/5ML IJ SOLN
INTRAMUSCULAR | Status: AC
  Filled 2015-10-30: qty 5

## 2015-10-30 MED ORDER — ONDANSETRON HCL 4 MG/2ML IJ SOLN
INTRAMUSCULAR | Status: DC | PRN
  Administered 2015-10-30: 4 mg via INTRAVENOUS

## 2015-10-30 MED ORDER — FENTANYL CITRATE (PF) 100 MCG/2ML IJ SOLN
25.0000 ug | INTRAMUSCULAR | Status: DC | PRN
  Administered 2015-10-30 (×3): 50 ug via INTRAVENOUS

## 2015-10-30 MED ORDER — LIDOCAINE HCL (CARDIAC) 20 MG/ML IV SOLN
INTRAVENOUS | Status: AC
  Filled 2015-10-30: qty 5

## 2015-10-30 MED ORDER — FENTANYL CITRATE (PF) 100 MCG/2ML IJ SOLN
INTRAMUSCULAR | Status: DC | PRN
  Administered 2015-10-30 (×2): 50 ug via INTRAVENOUS

## 2015-10-30 MED ORDER — ONDANSETRON HCL 4 MG/2ML IJ SOLN: 4.0000 mg | Freq: Once | INTRAMUSCULAR | Status: DC | PRN

## 2015-10-30 MED ORDER — MIDAZOLAM HCL 2 MG/2ML IJ SOLN
INTRAMUSCULAR | Status: AC
  Filled 2015-10-30: qty 2

## 2015-10-30 MED ORDER — FENTANYL CITRATE (PF) 100 MCG/2ML IJ SOLN
INTRAMUSCULAR | Status: AC
  Administered 2015-10-30: 50 ug via INTRAVENOUS
  Filled 2015-10-30: qty 2

## 2015-10-30 MED ORDER — ONDANSETRON HCL 4 MG/2ML IJ SOLN
INTRAMUSCULAR | Status: AC
  Filled 2015-10-30: qty 2

## 2015-10-30 MED ORDER — FENTANYL CITRATE (PF) 100 MCG/2ML IJ SOLN
INTRAMUSCULAR | Status: AC
  Filled 2015-10-30: qty 2

## 2015-10-30 MED ORDER — PROPOFOL 10 MG/ML IV BOLUS
INTRAVENOUS | Status: AC
  Filled 2015-10-30: qty 20

## 2015-10-30 MED ORDER — PROPOFOL 10 MG/ML IV BOLUS
INTRAVENOUS | Status: DC | PRN
  Administered 2015-10-30: 100 mg via INTRAVENOUS
  Administered 2015-10-30: 50 mg via INTRAVENOUS

## 2015-10-30 MED ORDER — LIDOCAINE HCL (CARDIAC) 20 MG/ML IV SOLN
INTRAVENOUS | Status: DC | PRN
  Administered 2015-10-30: 100 mg via INTRAVENOUS

## 2015-10-30 MED ORDER — 0.9 % SODIUM CHLORIDE (POUR BTL) OPTIME
TOPICAL | Status: DC | PRN
  Administered 2015-10-30: 1000 mL

## 2015-10-30 MED ORDER — LACTATED RINGERS IV SOLN
INTRAVENOUS | Status: DC
  Administered 2015-10-30: 50 mL/h via INTRAVENOUS

## 2015-10-30 MED ORDER — MIDAZOLAM HCL 5 MG/5ML IJ SOLN
INTRAMUSCULAR | Status: DC | PRN
  Administered 2015-10-30: 2 mg via INTRAVENOUS

## 2015-10-30 SURGICAL SUPPLY — 52 items
BANDAGE ELASTIC 6 VELCRO ST LF (GAUZE/BANDAGES/DRESSINGS) ×3 IMPLANT
BANDAGE ESMARK 6X9 LF (GAUZE/BANDAGES/DRESSINGS) ×1 IMPLANT
BLADE SAW RECIP 87.9 MT (BLADE) IMPLANT
BNDG COHESIVE 6X5 TAN STRL LF (GAUZE/BANDAGES/DRESSINGS) ×6 IMPLANT
BNDG ESMARK 6X9 LF (GAUZE/BANDAGES/DRESSINGS) ×3
BNDG GAUZE ELAST 4 BULKY (GAUZE/BANDAGES/DRESSINGS) ×3 IMPLANT
BNDG GAUZE STRTCH 6 (GAUZE/BANDAGES/DRESSINGS) IMPLANT
COVER SURGICAL LIGHT HANDLE (MISCELLANEOUS) ×6 IMPLANT
CUFF TOURNIQUET SINGLE 34IN LL (TOURNIQUET CUFF) IMPLANT
DRAIN PENROSE 1/2X12 LTX STRL (WOUND CARE) IMPLANT
DRAPE EXTREMITY T 121X128X90 (DRAPE) ×3 IMPLANT
DRAPE PROXIMA HALF (DRAPES) ×6 IMPLANT
DRAPE U-SHAPE 47X51 STRL (DRAPES) ×6 IMPLANT
DRSG ADAPTIC 3X8 NADH LF (GAUZE/BANDAGES/DRESSINGS) ×3 IMPLANT
DRSG PAD ABDOMINAL 8X10 ST (GAUZE/BANDAGES/DRESSINGS) ×3 IMPLANT
DURAPREP 26ML APPLICATOR (WOUND CARE) ×3 IMPLANT
ELECT CAUTERY BLADE 6.4 (BLADE) IMPLANT
ELECT REM PT RETURN 9FT ADLT (ELECTROSURGICAL) ×3
ELECTRODE REM PT RTRN 9FT ADLT (ELECTROSURGICAL) ×1 IMPLANT
EVACUATOR 1/8 PVC DRAIN (DRAIN) IMPLANT
GAUZE SPONGE 4X4 12PLY STRL (GAUZE/BANDAGES/DRESSINGS) ×3 IMPLANT
GLOVE BIOGEL PI IND STRL 6.5 (GLOVE) ×1 IMPLANT
GLOVE BIOGEL PI IND STRL 9 (GLOVE) ×1 IMPLANT
GLOVE BIOGEL PI INDICATOR 6.5 (GLOVE) ×2
GLOVE BIOGEL PI INDICATOR 9 (GLOVE) ×2
GLOVE ECLIPSE 6.5 STRL STRAW (GLOVE) ×3 IMPLANT
GLOVE SURG ORTHO 9.0 STRL STRW (GLOVE) ×3 IMPLANT
GOWN STRL REUS W/ TWL XL LVL3 (GOWN DISPOSABLE) ×2 IMPLANT
GOWN STRL REUS W/TWL XL LVL3 (GOWN DISPOSABLE) ×4
KIT BASIN OR (CUSTOM PROCEDURE TRAY) ×3 IMPLANT
KIT ROOM TURNOVER OR (KITS) ×3 IMPLANT
MANIFOLD NEPTUNE II (INSTRUMENTS) ×3 IMPLANT
NS IRRIG 1000ML POUR BTL (IV SOLUTION) ×3 IMPLANT
PACK GENERAL/GYN (CUSTOM PROCEDURE TRAY) ×3 IMPLANT
PAD ARMBOARD 7.5X6 YLW CONV (MISCELLANEOUS) ×6 IMPLANT
PAD CAST 4YDX4 CTTN HI CHSV (CAST SUPPLIES) ×1 IMPLANT
PADDING CAST COTTON 4X4 STRL (CAST SUPPLIES) ×2
PADDING CAST COTTON 6X4 STRL (CAST SUPPLIES) ×3 IMPLANT
SPONGE GAUZE 4X4 12PLY STER LF (GAUZE/BANDAGES/DRESSINGS) ×3 IMPLANT
SPONGE LAP 18X18 X RAY DECT (DISPOSABLE) IMPLANT
STAPLER VISISTAT 35W (STAPLE) IMPLANT
STOCKINETTE IMPERVIOUS LG (DRAPES) IMPLANT
SUT PDS AB 1 CT  36 (SUTURE)
SUT PDS AB 1 CT 36 (SUTURE) IMPLANT
SUT SILK 2 0 (SUTURE) ×6
SUT SILK 2-0 18XBRD TIE 12 (SUTURE) ×3 IMPLANT
SUT VIC AB 1 CTX 36 (SUTURE)
SUT VIC AB 1 CTX36XBRD ANBCTR (SUTURE) IMPLANT
TOWEL OR 17X24 6PK STRL BLUE (TOWEL DISPOSABLE) ×3 IMPLANT
TOWEL OR 17X26 10 PK STRL BLUE (TOWEL DISPOSABLE) ×3 IMPLANT
TUBE ANAEROBIC SPECIMEN COL (MISCELLANEOUS) IMPLANT
WATER STERILE IRR 1000ML POUR (IV SOLUTION) ×3 IMPLANT

## 2015-10-30 NOTE — Anesthesia Procedure Notes (Signed)
Procedure Name: LMA Insertion Date/Time: 10/30/2015 3:29 PM Performed by: Rogelia BogaMUELLER, Renn Dirocco P Pre-anesthesia Checklist: Patient identified, Emergency Drugs available, Suction available, Patient being monitored and Timeout performed Patient Re-evaluated:Patient Re-evaluated prior to inductionOxygen Delivery Method: Circle system utilized Preoxygenation: Pre-oxygenation with 100% oxygen Intubation Type: IV induction LMA: LMA with gastric port inserted LMA Size: 5.0 Number of attempts: 1 Placement Confirmation: positive ETCO2 and breath sounds checked- equal and bilateral Tube secured with: Tape Dental Injury: Teeth and Oropharynx as per pre-operative assessment

## 2015-10-30 NOTE — Transfer of Care (Signed)
Immediate Anesthesia Transfer of Care Note  Patient: Hector SalmonJohn W Green  Procedure(s) Performed: Procedure(s): Revision Right Below Knee Amputation (Right)  Patient Location: PACU  Anesthesia Type:General  Level of Consciousness: awake, alert , oriented and patient cooperative  Airway & Oxygen Therapy: Patient Spontanous Breathing and Patient connected to nasal cannula oxygen  Post-op Assessment: Report given to RN, Post -op Vital signs reviewed and stable and Patient moving all extremities X 4  Post vital signs: Reviewed and stable  Last Vitals:  Filed Vitals:   10/30/15 1257  BP: 155/73  Pulse:   Temp:   Resp:     Complications: No apparent anesthesia complications

## 2015-10-30 NOTE — H&P (Signed)
Hector SalmonJohn W Green is an 54 y.o. male.   Chief Complaint: dehiscence right transtibial amputation HPI: patient has progressive dehiscence of right BKA  Past Medical History  Diagnosis Date  . CAD (coronary artery disease) 2011    [place]  . Type 2 diabetes mellitus (HCC)   . Hyperlipidemia   . Peripheral arterial disease (HCC)   . Abnormal nuclear stress test 05/06/2015  . Myocardial infarction (HCC) 2011  . Paranoid schizophrenia (HCC)   . Essential hypertension     not on medications at this time    Past Surgical History  Procedure Laterality Date  . Coronary artery bypass graft  2011    Three vessel by report  . Abdominal aortagram N/A 03/30/2015    Procedure: ABDOMINAL Ronny FlurryAORTAGRAM;  Surgeon: Fransisco HertzBrian L Chen, MD;  [facility];  Service: Cardiovascular;  Laterality: N/A;  . Lower extremity angiogram N/A 03/30/2015    Procedure: LOWER EXTREMITY ANGIOGRAM;  Surgeon: Fransisco HertzBrian L Chen, MD;  [facility];  Service: Cardiovascular;  Laterality: N/A;  . Cardiac catheterization N/A 05/06/2015    Procedure: Left Heart Cath and Cors/Grafts Angiography;  Surgeon: Tonny BollmanMichael Cooper, MD;  [facility];  Service: Cardiovascular;  Laterality: N/A;  . Femoral-tibial bypass graft Right 05/13/2015    Procedure: BYPASS GRAFT RIGHT FEMORAL-POSTERIOR TIBIAL ARTERY USING LEFT NONREVERSED TRANSLOCATED GREATER SAPPHENOUS VEIN;  Surgeon: Pryor OchoaJames D Lawson, MD;  [facility];  Service: Vascular;  Laterality: Right;  . Vein harvest Left 05/13/2015    Procedure: LEFT GREATER SAPPHENOUS VEIN HARVEST;  Surgeon: Pryor OchoaJames D Lawson, MD;  [facility];  Service: Vascular;  Laterality: Left;  . Endarterectomy femoral Right 05/13/2015    Procedure: RIGHT COMMON FEMORAL, SUPERFICIAL FEMORAL AND PROFUNDA ENDARTERECTOMY ;  Surgeon: Pryor OchoaJames D Lawson, MD;  [facility];  Service: Vascular;  Laterality: Right;  . Patch angioplasty Right 05/13/2015    Procedure: RIGHT FEMORAL ARTERY  PATCH ANGIOPLASTY;  Surgeon: Pryor OchoaJames D Lawson, MD;  [facility];  Service: Vascular;  Laterality: Right;  . Intraoperative arteriogram Right 05/13/2015    Procedure: RIGHT LOWER LEG INTRA OPERATIVE ARTERIOGRAM;  Surgeon: Pryor OchoaJames D Lawson, MD;  [facility];  Service: Vascular;  Laterality: Right;  . Thrombectomy femoral artery Right 05/13/2015    Procedure: THROMBECTOMY RIGHT FEMORAL-PROXIMAL POSTERIOR TIBAIL ARTERY BYPASS GRAFT;  Surgeon: Larina Earthlyodd F Early, MD;  [facility];  Service: Vascular;  Laterality: Right;  . Colonoscopy    . Femoral-tibial bypass graft Right 05/27/2015    Procedure: THROMBECTOMY OF RIGHT FEMORAL-POSTERIOR TIBIAL ARTERY SAPHENOUS VEIN BYPASS GRAFT ;  Surgeon: Pryor OchoaJames D Lawson, MD;  [facility];  Service: Vascular;  Laterality: Right;  . Intraoperative arteriogram Right 05/27/2015    Procedure: INTRA OPERATIVE ARTERIOGRAM;  Surgeon: Pryor OchoaJames D Lawson, MD;  [facility];  Service: Vascular;  Laterality: Right;  . 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 Lajoyce Cornersuda  . Amputation Right 07/08/2015    Procedure: AMPUTATION BELOW RIGHT KNEE;  Surgeon: Nadara MustardMarcus Duda V, MD;  Location: [facility] OR;  Service: Orthopedics;  Laterality: Right;  . Stump revision Right 08/28/2015    Procedure: Revision Right Below Knee Amputation;  Surgeon: Nadara MustardMarcus Duda V, MD;  [facility];  Service: Orthopedics;  Laterality: Right;    Family History  Problem Relation Age of Onset  . Cancer Father    Social History:  reports that he has been smoking Cigarettes.  He started smoking about  40 years ago. He has a 7.5 pack-year smoking history. He has never used smokeless tobacco. He reports that he does not drink alcohol or use illicit drugs.  Allergies:  Allergies  Allergen Reactions  . Fish-Derived Products Anaphylaxis  . Penicillins Anaphylaxis    No prescriptions prior to admission    No results found for this or any previous visit (from the past 48 hour(s)). No results found.  Review of  Systems  All other systems reviewed and are negative.   Height  (1.778 m), weight 78.472 kg (173 lb). Physical Exam  Dehiscence of right BKA Assessment/Plan Right BKA dehiscence. Plan for revision right BKA  DUDA,MARCUS V 10/30/2015, 6:46 AM

## 2015-10-30 NOTE — Op Note (Signed)
10/30/2015  3:51 PM  PATIENT:  Hector Green    PRE-OPERATIVE DIAGNOSIS:  Dehiscence Right Below Knee Amputation  POST-OPERATIVE DIAGNOSIS:  Same  PROCEDURE:  Revision Right Below Knee Amputation  SURGEON:  Nadara MustardUDA,Elizbeth Posa V, MD  PHYSICIAN ASSISTANT:None ANESTHESIA:   General  PREOPERATIVE INDICATIONS:  Hector Green is a  54 y.o. male with a diagnosis of Dehiscence Right Below Knee Amputation who failed conservative measures and elected for surgical management.    The risks benefits and alternatives were discussed with the patient preoperatively including but not limited to the risks of infection, bleeding, nerve injury, cardiopulmonary complications, the need for revision surgery, among others, and the patient was willing to proceed.  OPERATIVE IMPLANTS: None  OPERATIVE FINDINGS: Good petechial bleeding in the skin ischemic changes deep  OPERATIVE PROCEDURE: Patient was brought to the operating room and underwent a general anesthetic. After adequate levels anesthesia were obtained agents right lower extremity was prepped using DuraPrep draped into a sterile field a timeout was called. A fishmouth incision was made around the ulcer this carried down to bone the distal 2 cm of the tibia and fibula were resected with the nonviable tissue. Hemostasis was obtained. The skin deep and superficial fascial layers were closed using #2-0 Vicryl. A sterile compressive dressing was applied. Patient was extubated taken to the PACU in stable condition. Plan for discharge to home.

## 2015-10-30 NOTE — Anesthesia Postprocedure Evaluation (Signed)
  Anesthesia Post-op Note  Patient: Hector SalmonJohn W Green  Procedure(s) Performed: Procedure(s) (LRB): Revision Right Below Knee Amputation (Right)  Patient Location: PACU  Anesthesia Type: General  Level of Consciousness: awake and alert   Airway and Oxygen Therapy: Patient Spontanous Breathing  Post-op Pain: mild  Post-op Assessment: Post-op Vital signs reviewed, Patient's Cardiovascular Status Stable, Respiratory Function Stable, Patent Airway and No signs of Nausea or vomiting  Last Vitals:  Filed Vitals:   10/30/15 1739  BP: 169/87  Pulse: 89  Temp:   Resp: 17    Post-op Vital Signs: stable   Complications: No apparent anesthesia complications

## 2015-10-30 NOTE — Anesthesia Preprocedure Evaluation (Addendum)
Anesthesia Evaluation  Patient identified by MRN, date of birth, ID band Patient awake    Reviewed: Allergy & Precautions, H&P , NPO status , Patient's Chart, lab work & pertinent test results  History of Anesthesia Complications Negative for: history of anesthetic complications  Airway Mallampati: II  TM Distance: >3 FB Neck ROM: Full    Dental no notable dental hx. (+) Dental Advisory Given, Poor Dentition, Missing   Pulmonary Current Smoker,    Pulmonary exam normal breath sounds clear to auscultation       Cardiovascular hypertension, Pt. on medications + CAD, + Past MI, + CABG and + Peripheral Vascular Disease   Rhythm:Regular Rate:Normal     Neuro/Psych PSYCHIATRIC DISORDERS Schizophrenia negative neurological ROS     GI/Hepatic negative GI ROS, Neg liver ROS,   Endo/Other  diabetes, Type 2, Oral Hypoglycemic Agents  Renal/GU negative Renal ROS     Musculoskeletal negative musculoskeletal ROS (+)   Abdominal   Peds  Hematology negative hematology ROS (+)   Anesthesia Other Findings Day of surgery medications reviewed with the patient.  Reproductive/Obstetrics negative OB ROS                         Anesthesia Physical Anesthesia Plan  ASA: III  Anesthesia Plan: General   Post-op Pain Management:    Induction: Intravenous  Airway Management Planned: LMA  Additional Equipment:   Intra-op Plan:   Post-operative Plan: Extubation in OR  Informed Consent: I have reviewed the patients History and Physical, chart, labs and discussed the procedure including the risks, benefits and alternatives for the proposed anesthesia with the patient or authorized representative who has indicated his/her understanding and acceptance.   Dental advisory given  Plan Discussed with: CRNA, Anesthesiologist and Surgeon  Anesthesia Plan Comments: (Risks/benefits of general anesthesia discussed  with patient including risk of damage to teeth, lips, gum, and tongue, nausea/vomiting, allergic reactions to medications, and the possibility of heart attack, stroke and death.  All patient questions answered.  Patient wishes to proceed.)      Anesthesia Quick Evaluation

## 2015-11-02 ENCOUNTER — Encounter (HOSPITAL_COMMUNITY): Payer: Self-pay | Admitting: Orthopedic Surgery

## 2015-11-02 ENCOUNTER — Telehealth: Payer: Self-pay | Admitting: *Deleted

## 2015-11-02 ENCOUNTER — Ambulatory Visit (INDEPENDENT_AMBULATORY_CARE_PROVIDER_SITE_OTHER): Payer: Medicare Other | Admitting: Pharmacist

## 2015-11-02 DIAGNOSIS — I739 Peripheral vascular disease, unspecified: Secondary | ICD-10-CM

## 2015-11-02 DIAGNOSIS — E119 Type 2 diabetes mellitus without complications: Secondary | ICD-10-CM | POA: Diagnosis not present

## 2015-11-02 DIAGNOSIS — I2510 Atherosclerotic heart disease of native coronary artery without angina pectoris: Secondary | ICD-10-CM | POA: Diagnosis not present

## 2015-11-02 DIAGNOSIS — Z89511 Acquired absence of right leg below knee: Secondary | ICD-10-CM | POA: Diagnosis not present

## 2015-11-02 DIAGNOSIS — Z4781 Encounter for orthopedic aftercare following surgical amputation: Secondary | ICD-10-CM | POA: Diagnosis not present

## 2015-11-02 DIAGNOSIS — I10 Essential (primary) hypertension: Secondary | ICD-10-CM | POA: Diagnosis not present

## 2015-11-02 LAB — POCT INR: INR: 2

## 2015-11-02 LAB — GLUCOSE, CAPILLARY: GLUCOSE-CAPILLARY: 121 mg/dL — AB (ref 65–99)

## 2015-11-02 NOTE — Telephone Encounter (Signed)
Protime 24.5 INR 2.0

## 2015-11-02 NOTE — Progress Notes (Signed)
No charge for labs or visit - INR check through home monitoring system  

## 2015-11-02 NOTE — Telephone Encounter (Signed)
Therapeutic anticoagulation. Continue current warfarin 5mg  dose of 1 tablet on Mondays and Wednesdays and 2 tablets all other days.  Recheck INR in 1 week.  Patient, home health nurse both notified.

## 2015-11-04 DIAGNOSIS — E119 Type 2 diabetes mellitus without complications: Secondary | ICD-10-CM | POA: Diagnosis not present

## 2015-11-04 DIAGNOSIS — I739 Peripheral vascular disease, unspecified: Secondary | ICD-10-CM | POA: Diagnosis not present

## 2015-11-04 DIAGNOSIS — Z89511 Acquired absence of right leg below knee: Secondary | ICD-10-CM | POA: Diagnosis not present

## 2015-11-04 DIAGNOSIS — I2510 Atherosclerotic heart disease of native coronary artery without angina pectoris: Secondary | ICD-10-CM | POA: Diagnosis not present

## 2015-11-04 DIAGNOSIS — I10 Essential (primary) hypertension: Secondary | ICD-10-CM | POA: Diagnosis not present

## 2015-11-04 DIAGNOSIS — Z4781 Encounter for orthopedic aftercare following surgical amputation: Secondary | ICD-10-CM | POA: Diagnosis not present

## 2015-11-06 DIAGNOSIS — Z4781 Encounter for orthopedic aftercare following surgical amputation: Secondary | ICD-10-CM | POA: Diagnosis not present

## 2015-11-06 DIAGNOSIS — Z89511 Acquired absence of right leg below knee: Secondary | ICD-10-CM | POA: Diagnosis not present

## 2015-11-06 DIAGNOSIS — I739 Peripheral vascular disease, unspecified: Secondary | ICD-10-CM | POA: Diagnosis not present

## 2015-11-06 DIAGNOSIS — I10 Essential (primary) hypertension: Secondary | ICD-10-CM | POA: Diagnosis not present

## 2015-11-06 DIAGNOSIS — E119 Type 2 diabetes mellitus without complications: Secondary | ICD-10-CM | POA: Diagnosis not present

## 2015-11-06 DIAGNOSIS — I2510 Atherosclerotic heart disease of native coronary artery without angina pectoris: Secondary | ICD-10-CM | POA: Diagnosis not present

## 2015-11-08 DIAGNOSIS — Z7901 Long term (current) use of anticoagulants: Secondary | ICD-10-CM | POA: Diagnosis not present

## 2015-11-08 DIAGNOSIS — I2510 Atherosclerotic heart disease of native coronary artery without angina pectoris: Secondary | ICD-10-CM | POA: Diagnosis not present

## 2015-11-08 DIAGNOSIS — E1151 Type 2 diabetes mellitus with diabetic peripheral angiopathy without gangrene: Secondary | ICD-10-CM | POA: Diagnosis not present

## 2015-11-08 DIAGNOSIS — I252 Old myocardial infarction: Secondary | ICD-10-CM | POA: Diagnosis not present

## 2015-11-08 DIAGNOSIS — I10 Essential (primary) hypertension: Secondary | ICD-10-CM | POA: Diagnosis not present

## 2015-11-08 DIAGNOSIS — F17210 Nicotine dependence, cigarettes, uncomplicated: Secondary | ICD-10-CM | POA: Diagnosis not present

## 2015-11-08 DIAGNOSIS — Z5181 Encounter for therapeutic drug level monitoring: Secondary | ICD-10-CM | POA: Diagnosis not present

## 2015-11-08 DIAGNOSIS — F200 Paranoid schizophrenia: Secondary | ICD-10-CM | POA: Diagnosis not present

## 2015-11-08 DIAGNOSIS — Z4781 Encounter for orthopedic aftercare following surgical amputation: Secondary | ICD-10-CM | POA: Diagnosis not present

## 2015-11-09 ENCOUNTER — Telehealth: Payer: Self-pay | Admitting: *Deleted

## 2015-11-09 ENCOUNTER — Ambulatory Visit (INDEPENDENT_AMBULATORY_CARE_PROVIDER_SITE_OTHER): Payer: Medicare Other | Admitting: Pharmacist

## 2015-11-09 DIAGNOSIS — I2510 Atherosclerotic heart disease of native coronary artery without angina pectoris: Secondary | ICD-10-CM | POA: Diagnosis not present

## 2015-11-09 DIAGNOSIS — I10 Essential (primary) hypertension: Secondary | ICD-10-CM | POA: Diagnosis not present

## 2015-11-09 DIAGNOSIS — E1151 Type 2 diabetes mellitus with diabetic peripheral angiopathy without gangrene: Secondary | ICD-10-CM | POA: Diagnosis not present

## 2015-11-09 DIAGNOSIS — E1165 Type 2 diabetes mellitus with hyperglycemia: Secondary | ICD-10-CM

## 2015-11-09 DIAGNOSIS — F17210 Nicotine dependence, cigarettes, uncomplicated: Secondary | ICD-10-CM | POA: Diagnosis not present

## 2015-11-09 DIAGNOSIS — I739 Peripheral vascular disease, unspecified: Secondary | ICD-10-CM

## 2015-11-09 DIAGNOSIS — E118 Type 2 diabetes mellitus with unspecified complications: Principal | ICD-10-CM

## 2015-11-09 DIAGNOSIS — Z4781 Encounter for orthopedic aftercare following surgical amputation: Secondary | ICD-10-CM | POA: Diagnosis not present

## 2015-11-09 DIAGNOSIS — IMO0002 Reserved for concepts with insufficient information to code with codable children: Secondary | ICD-10-CM

## 2015-11-09 DIAGNOSIS — F200 Paranoid schizophrenia: Secondary | ICD-10-CM | POA: Diagnosis not present

## 2015-11-09 LAB — POCT INR: INR: 1

## 2015-11-09 MED ORDER — WARFARIN SODIUM 5 MG PO TABS: 10.0000 mg | ORAL_TABLET | Freq: Every day | ORAL | Status: DC

## 2015-11-09 NOTE — Telephone Encounter (Signed)
Protime 12.5  INR 1.0  Been out x 2 days

## 2015-11-09 NOTE — Telephone Encounter (Signed)
Rx sent to patient's pharmacy.  Recommended that patient take 15mg  for next 2 days and then resume usual warfarin dose of 10mg  on Mondays and Wednesdays and 5mg  all other days. Recheck INR in 3 to 4 days.   Left message for patient and [facility].  Lynden AngCathy is concerned that patient is not being compliant with therapy and medications.  [facility] sent

## 2015-11-10 DIAGNOSIS — I2510 Atherosclerotic heart disease of native coronary artery without angina pectoris: Secondary | ICD-10-CM | POA: Diagnosis not present

## 2015-11-10 DIAGNOSIS — Z4781 Encounter for orthopedic aftercare following surgical amputation: Secondary | ICD-10-CM | POA: Diagnosis not present

## 2015-11-10 DIAGNOSIS — E1151 Type 2 diabetes mellitus with diabetic peripheral angiopathy without gangrene: Secondary | ICD-10-CM | POA: Diagnosis not present

## 2015-11-10 DIAGNOSIS — F200 Paranoid schizophrenia: Secondary | ICD-10-CM | POA: Diagnosis not present

## 2015-11-10 DIAGNOSIS — F17210 Nicotine dependence, cigarettes, uncomplicated: Secondary | ICD-10-CM | POA: Diagnosis not present

## 2015-11-10 DIAGNOSIS — I10 Essential (primary) hypertension: Secondary | ICD-10-CM | POA: Diagnosis not present

## 2015-11-13 DIAGNOSIS — E1151 Type 2 diabetes mellitus with diabetic peripheral angiopathy without gangrene: Secondary | ICD-10-CM | POA: Diagnosis not present

## 2015-11-13 DIAGNOSIS — Z4781 Encounter for orthopedic aftercare following surgical amputation: Secondary | ICD-10-CM | POA: Diagnosis not present

## 2015-11-13 DIAGNOSIS — F17210 Nicotine dependence, cigarettes, uncomplicated: Secondary | ICD-10-CM | POA: Diagnosis not present

## 2015-11-13 DIAGNOSIS — F200 Paranoid schizophrenia: Secondary | ICD-10-CM | POA: Diagnosis not present

## 2015-11-13 DIAGNOSIS — I10 Essential (primary) hypertension: Secondary | ICD-10-CM | POA: Diagnosis not present

## 2015-11-13 DIAGNOSIS — I2510 Atherosclerotic heart disease of native coronary artery without angina pectoris: Secondary | ICD-10-CM | POA: Diagnosis not present

## 2015-11-16 ENCOUNTER — Ambulatory Visit (INDEPENDENT_AMBULATORY_CARE_PROVIDER_SITE_OTHER): Payer: Medicare Other | Admitting: Pharmacist

## 2015-11-16 ENCOUNTER — Telehealth: Payer: Self-pay | Admitting: Family Medicine

## 2015-11-16 DIAGNOSIS — Z4781 Encounter for orthopedic aftercare following surgical amputation: Secondary | ICD-10-CM | POA: Diagnosis not present

## 2015-11-16 DIAGNOSIS — E1151 Type 2 diabetes mellitus with diabetic peripheral angiopathy without gangrene: Secondary | ICD-10-CM | POA: Diagnosis not present

## 2015-11-16 DIAGNOSIS — I10 Essential (primary) hypertension: Secondary | ICD-10-CM | POA: Diagnosis not present

## 2015-11-16 DIAGNOSIS — F17210 Nicotine dependence, cigarettes, uncomplicated: Secondary | ICD-10-CM | POA: Diagnosis not present

## 2015-11-16 DIAGNOSIS — F200 Paranoid schizophrenia: Secondary | ICD-10-CM | POA: Diagnosis not present

## 2015-11-16 DIAGNOSIS — I739 Peripheral vascular disease, unspecified: Secondary | ICD-10-CM

## 2015-11-16 DIAGNOSIS — I2510 Atherosclerotic heart disease of native coronary artery without angina pectoris: Secondary | ICD-10-CM | POA: Diagnosis not present

## 2015-11-16 LAB — POCT INR: INR: 1

## 2015-11-16 NOTE — Telephone Encounter (Signed)
I called patient - he was out of warfarin for 3 days / though he said the same last week.  He said that he that he took 10mg  of warfarin yesterday at 5pm.  I advised that he take 15mg  for the next 2 days and then restart regular warfarin dose of 10mg  daily except 5mg  on mondays and wednesdays.   Long discussion with patient about compliance with warfarin.  Discussed the risk of not taking this medication.  I had hope that [facility] would be able to have a Child psychotherapistsocial worker or nurse go out to visit patient but when I ordered they state that patient is not eligible for their services.  Lynden AngCathy verifies that patient has medication at home.  I recommended that she recheck INR later this week.  I also suggested pill count to insure patient is taking medication.

## 2015-11-19 ENCOUNTER — Ambulatory Visit (INDEPENDENT_AMBULATORY_CARE_PROVIDER_SITE_OTHER): Payer: Medicare Other | Admitting: Pharmacist

## 2015-11-19 DIAGNOSIS — I10 Essential (primary) hypertension: Secondary | ICD-10-CM | POA: Diagnosis not present

## 2015-11-19 DIAGNOSIS — I2510 Atherosclerotic heart disease of native coronary artery without angina pectoris: Secondary | ICD-10-CM | POA: Diagnosis not present

## 2015-11-19 DIAGNOSIS — Z4781 Encounter for orthopedic aftercare following surgical amputation: Secondary | ICD-10-CM | POA: Diagnosis not present

## 2015-11-19 DIAGNOSIS — I739 Peripheral vascular disease, unspecified: Secondary | ICD-10-CM

## 2015-11-19 DIAGNOSIS — F200 Paranoid schizophrenia: Secondary | ICD-10-CM | POA: Diagnosis not present

## 2015-11-19 DIAGNOSIS — F17210 Nicotine dependence, cigarettes, uncomplicated: Secondary | ICD-10-CM | POA: Diagnosis not present

## 2015-11-19 DIAGNOSIS — E1151 Type 2 diabetes mellitus with diabetic peripheral angiopathy without gangrene: Secondary | ICD-10-CM | POA: Diagnosis not present

## 2015-11-19 LAB — POCT INR: INR: 2.1

## 2015-11-19 NOTE — Progress Notes (Signed)
[facility]   INR was 2.1 which is therapeutic.   Patient to restart warfarin dose of 5mg  mondays and wednesdays and 10mg  all other days.  Lynden AngCathy reported that a Child psychotherapistsocial worker will be coming out to help patient with living situation.  Hope is to find him an apartment for just him.

## 2015-11-20 ENCOUNTER — Telehealth: Payer: Self-pay | Admitting: Family Medicine

## 2015-11-24 ENCOUNTER — Telehealth: Payer: Self-pay | Admitting: *Deleted

## 2015-11-24 ENCOUNTER — Ambulatory Visit: Payer: Self-pay | Admitting: Nurse Practitioner

## 2015-11-24 DIAGNOSIS — F17210 Nicotine dependence, cigarettes, uncomplicated: Secondary | ICD-10-CM | POA: Diagnosis not present

## 2015-11-24 DIAGNOSIS — I2510 Atherosclerotic heart disease of native coronary artery without angina pectoris: Secondary | ICD-10-CM | POA: Diagnosis not present

## 2015-11-24 DIAGNOSIS — I10 Essential (primary) hypertension: Secondary | ICD-10-CM | POA: Diagnosis not present

## 2015-11-24 DIAGNOSIS — E1151 Type 2 diabetes mellitus with diabetic peripheral angiopathy without gangrene: Secondary | ICD-10-CM | POA: Diagnosis not present

## 2015-11-24 DIAGNOSIS — Z4781 Encounter for orthopedic aftercare following surgical amputation: Secondary | ICD-10-CM | POA: Diagnosis not present

## 2015-11-24 DIAGNOSIS — F200 Paranoid schizophrenia: Secondary | ICD-10-CM | POA: Diagnosis not present

## 2015-11-24 DIAGNOSIS — I739 Peripheral vascular disease, unspecified: Secondary | ICD-10-CM

## 2015-11-24 LAB — PROTIME-INR: INR: 1.9 — AB (ref <=1.1)

## 2015-11-24 NOTE — Telephone Encounter (Signed)
Patient very confused about coumadin dose= he is suppose to be on 5mg  tablet 2 po qd except 1 on mondays and Wednesday- INR 1.9  - told to take 2 tablets tomorrow then back pn regular dose then recheck in 1 week. Patient said that he understood.

## 2015-11-24 NOTE — Patient Instructions (Signed)
Instructions given over phone.  

## 2015-11-24 NOTE — Progress Notes (Signed)
anticoag note see

## 2015-11-24 NOTE — Telephone Encounter (Signed)
INR 1.8  Protime 21.1

## 2015-11-24 NOTE — Telephone Encounter (Signed)
Need to take 2 tablets today then continue current dose. Recheck in 1 week

## 2015-11-25 NOTE — Telephone Encounter (Signed)
Detailed message left that last A1c was on 09/28/2015.

## 2015-11-25 NOTE — Telephone Encounter (Signed)
Per MMM patient needs an appt with a regular provider. Appt made and patient notified

## 2015-11-26 DIAGNOSIS — E1151 Type 2 diabetes mellitus with diabetic peripheral angiopathy without gangrene: Secondary | ICD-10-CM | POA: Diagnosis not present

## 2015-11-26 DIAGNOSIS — F17210 Nicotine dependence, cigarettes, uncomplicated: Secondary | ICD-10-CM | POA: Diagnosis not present

## 2015-11-26 DIAGNOSIS — F200 Paranoid schizophrenia: Secondary | ICD-10-CM | POA: Diagnosis not present

## 2015-11-26 DIAGNOSIS — Z4781 Encounter for orthopedic aftercare following surgical amputation: Secondary | ICD-10-CM | POA: Diagnosis not present

## 2015-11-26 DIAGNOSIS — I10 Essential (primary) hypertension: Secondary | ICD-10-CM | POA: Diagnosis not present

## 2015-11-26 DIAGNOSIS — I2510 Atherosclerotic heart disease of native coronary artery without angina pectoris: Secondary | ICD-10-CM | POA: Diagnosis not present

## 2015-11-28 DIAGNOSIS — I10 Essential (primary) hypertension: Secondary | ICD-10-CM | POA: Diagnosis not present

## 2015-11-28 DIAGNOSIS — I2510 Atherosclerotic heart disease of native coronary artery without angina pectoris: Secondary | ICD-10-CM | POA: Diagnosis not present

## 2015-11-28 DIAGNOSIS — F200 Paranoid schizophrenia: Secondary | ICD-10-CM | POA: Diagnosis not present

## 2015-11-28 DIAGNOSIS — E1151 Type 2 diabetes mellitus with diabetic peripheral angiopathy without gangrene: Secondary | ICD-10-CM | POA: Diagnosis not present

## 2015-11-28 DIAGNOSIS — F17210 Nicotine dependence, cigarettes, uncomplicated: Secondary | ICD-10-CM | POA: Diagnosis not present

## 2015-11-28 DIAGNOSIS — Z4781 Encounter for orthopedic aftercare following surgical amputation: Secondary | ICD-10-CM | POA: Diagnosis not present

## 2015-11-30 ENCOUNTER — Ambulatory Visit: Payer: Self-pay | Admitting: Family Medicine

## 2015-11-30 ENCOUNTER — Telehealth: Payer: Self-pay | Admitting: *Deleted

## 2015-11-30 ENCOUNTER — Ambulatory Visit (INDEPENDENT_AMBULATORY_CARE_PROVIDER_SITE_OTHER): Payer: Medicare Other | Admitting: Pharmacist

## 2015-11-30 DIAGNOSIS — F17210 Nicotine dependence, cigarettes, uncomplicated: Secondary | ICD-10-CM | POA: Diagnosis not present

## 2015-11-30 DIAGNOSIS — F200 Paranoid schizophrenia: Secondary | ICD-10-CM | POA: Diagnosis not present

## 2015-11-30 DIAGNOSIS — E1151 Type 2 diabetes mellitus with diabetic peripheral angiopathy without gangrene: Secondary | ICD-10-CM | POA: Diagnosis not present

## 2015-11-30 DIAGNOSIS — Z4781 Encounter for orthopedic aftercare following surgical amputation: Secondary | ICD-10-CM | POA: Diagnosis not present

## 2015-11-30 DIAGNOSIS — I10 Essential (primary) hypertension: Secondary | ICD-10-CM | POA: Diagnosis not present

## 2015-11-30 DIAGNOSIS — I739 Peripheral vascular disease, unspecified: Secondary | ICD-10-CM

## 2015-11-30 DIAGNOSIS — I2510 Atherosclerotic heart disease of native coronary artery without angina pectoris: Secondary | ICD-10-CM | POA: Diagnosis not present

## 2015-11-30 LAB — POCT INR: INR: 1.3

## 2015-11-30 NOTE — Telephone Encounter (Signed)
Subtherapeutic anticoagulation suspect non compliance.  Recommend take warfarin 5mg  - 3 tablets today and tomorrow, then take 5mg  Mondays and Fridays and 10mg  all other days.  Patient and Hector Green notified.  Also Hector Green wanted us to note that Dr Lajoyce Cornersuda has stopped patient's pain medication

## 2015-11-30 NOTE — Telephone Encounter (Signed)
Protime 15.0  INR 1.3  Takes 10mg  qd except 5mg  on Monday and wednesday

## 2015-12-01 ENCOUNTER — Ambulatory Visit (INDEPENDENT_AMBULATORY_CARE_PROVIDER_SITE_OTHER): Payer: Medicare Other | Admitting: Family Medicine

## 2015-12-01 ENCOUNTER — Encounter: Payer: Self-pay | Admitting: Family Medicine

## 2015-12-01 VITALS — BP 141/77 | HR 91 | Temp 97.0°F | Ht 70.0 in | Wt 145.8 lb

## 2015-12-01 DIAGNOSIS — IMO0002 Reserved for concepts with insufficient information to code with codable children: Secondary | ICD-10-CM

## 2015-12-01 DIAGNOSIS — I70269 Atherosclerosis of native arteries of extremities with gangrene, unspecified extremity: Secondary | ICD-10-CM

## 2015-12-01 DIAGNOSIS — I25810 Atherosclerosis of coronary artery bypass graft(s) without angina pectoris: Secondary | ICD-10-CM | POA: Diagnosis not present

## 2015-12-01 DIAGNOSIS — E118 Type 2 diabetes mellitus with unspecified complications: Secondary | ICD-10-CM | POA: Diagnosis not present

## 2015-12-01 DIAGNOSIS — Z0000 Encounter for general adult medical examination without abnormal findings: Secondary | ICD-10-CM

## 2015-12-01 DIAGNOSIS — E1165 Type 2 diabetes mellitus with hyperglycemia: Secondary | ICD-10-CM

## 2015-12-01 DIAGNOSIS — L608 Other nail disorders: Secondary | ICD-10-CM | POA: Insufficient documentation

## 2015-12-01 DIAGNOSIS — L609 Nail disorder, unspecified: Secondary | ICD-10-CM

## 2015-12-01 NOTE — Progress Notes (Signed)
   HPI  Patient presents today For routine follow-up.  He has no complaints, he is recovering well from a R sided BKA for acute limb ischemia from PAD, he is on coumadin  DM2- Good med compliance, tolerating well CAD- S/p Triple bypass, not started lipitor b/c "didnt know what it was for" but has it at home.   Needs an appt for strips  Denies cp, cough, dyspnea, and leg edema.  Has difficult tiume trimming nails [facility] [place]     PMH: Smoking status noted ROS: Per HPI  Objective: BP 141/77 mmHg  Pulse 91  Temp(Src) 97 F (36.1 C) (Oral)  Ht 5\' 10"  (1.778 m)  Wt 145 lb 12.8 oz (66.134 kg)  BMI 20.92 kg/m2 Gen: NAD, alert, cooperative with exam HEENT: NCAT CV: RRR, good S1/S2, no murmur Resp: CTABL, no wheezes, non-labored Ext: No edema on L , R BKA Neuro: Alert and oriented, No gross deficits   Diabetic foot: 1+ DP pulse on L, R sided BKA Sensation intact to monfilament Scaling of skin Thickend long toenails  Assessment and plan:  # DM2 Uncontrolled but likely improving A1C in 1 month Diabetic foot exam, needs podiatry so I have referred Lipitor, he will start  # CAD, S/p CABG No chest pain Continue asa Start lipitor  HCM Foot exam Request c scope records   Orders Placed This Encounter  Procedures  . Microalbumin / creatinine urine ratio    Meds ordered this encounter  Medications  . glucose blood test strip    Sig: 1 each by Other route as needed for other. Use as instructed    [name] Ellia Knowlton, MD [facility] 12/01/2015, 10:15 AM

## 2015-12-01 NOTE — Patient Instructions (Addendum)
Great to meet you!  We will arrange a podiatry appointment  Start lipitor for cholesterol to reduce the risk of heart attack and vascular disease, we will re-check cholesterol 3 months from now (march)  Please come back in March to see me

## 2015-12-02 LAB — MICROALBUMIN / CREATININE URINE RATIO
CREATININE, UR: 80.6 mg/dL
MICROALB/CREAT RATIO: 827.2 mg/g creat — ABNORMAL HIGH (ref 0.0–30.0)
MICROALBUM., U, RANDOM: 666.7 ug/mL

## 2015-12-04 ENCOUNTER — Telehealth: Payer: Self-pay | Admitting: *Deleted

## 2015-12-04 ENCOUNTER — Telehealth: Payer: Self-pay | Admitting: Family Medicine

## 2015-12-04 ENCOUNTER — Ambulatory Visit (INDEPENDENT_AMBULATORY_CARE_PROVIDER_SITE_OTHER): Payer: Medicare Other | Admitting: Pharmacist

## 2015-12-04 DIAGNOSIS — I10 Essential (primary) hypertension: Secondary | ICD-10-CM | POA: Diagnosis not present

## 2015-12-04 DIAGNOSIS — F17210 Nicotine dependence, cigarettes, uncomplicated: Secondary | ICD-10-CM | POA: Diagnosis not present

## 2015-12-04 DIAGNOSIS — I2510 Atherosclerotic heart disease of native coronary artery without angina pectoris: Secondary | ICD-10-CM | POA: Diagnosis not present

## 2015-12-04 DIAGNOSIS — I739 Peripheral vascular disease, unspecified: Secondary | ICD-10-CM

## 2015-12-04 DIAGNOSIS — E1151 Type 2 diabetes mellitus with diabetic peripheral angiopathy without gangrene: Secondary | ICD-10-CM | POA: Diagnosis not present

## 2015-12-04 DIAGNOSIS — F200 Paranoid schizophrenia: Secondary | ICD-10-CM | POA: Diagnosis not present

## 2015-12-04 DIAGNOSIS — Z4781 Encounter for orthopedic aftercare following surgical amputation: Secondary | ICD-10-CM | POA: Diagnosis not present

## 2015-12-04 LAB — POCT INR: INR: 2.8

## 2015-12-04 NOTE — Progress Notes (Signed)
No charge for labs or visit - INR checked by home health nurse

## 2015-12-04 NOTE — Telephone Encounter (Signed)
Protime 34.0  INR 2.8

## 2015-12-04 NOTE — Telephone Encounter (Signed)
Patient took 15mg  on 11/30/15 and 11/30/18 as instructed.  INR is now at goal.  Recommend continue as planned with new warfarin dose of 5mg  - take 1 tablet on mondays and fridays and 2 tablets all other days.  Patient notified of results and recommendations.  Also contacted [facility] and she will recheck INR in 1 week.

## 2015-12-04 NOTE — Telephone Encounter (Signed)
Called to discuss urine resuslts, he has diabetic nephropathy and should start lisinopril.   I left a VM to call back.   [name] Bradshaw, MD [facility] 12/04/2015, 12:57 PM

## 2015-12-04 NOTE — Telephone Encounter (Signed)
Still no answer, will route to nursing pool. Murtis Sink.   Sam Bradshaw, MD [facility] 12/04/2015, 5:27 PM

## 2015-12-07 DIAGNOSIS — F200 Paranoid schizophrenia: Secondary | ICD-10-CM | POA: Diagnosis not present

## 2015-12-07 DIAGNOSIS — E1151 Type 2 diabetes mellitus with diabetic peripheral angiopathy without gangrene: Secondary | ICD-10-CM | POA: Diagnosis not present

## 2015-12-07 DIAGNOSIS — I2510 Atherosclerotic heart disease of native coronary artery without angina pectoris: Secondary | ICD-10-CM | POA: Diagnosis not present

## 2015-12-07 DIAGNOSIS — Z4781 Encounter for orthopedic aftercare following surgical amputation: Secondary | ICD-10-CM | POA: Diagnosis not present

## 2015-12-07 DIAGNOSIS — F17210 Nicotine dependence, cigarettes, uncomplicated: Secondary | ICD-10-CM | POA: Diagnosis not present

## 2015-12-07 DIAGNOSIS — I10 Essential (primary) hypertension: Secondary | ICD-10-CM | POA: Diagnosis not present

## 2015-12-08 ENCOUNTER — Telehealth: Payer: Self-pay | Admitting: Pharmacist

## 2015-12-08 NOTE — Telephone Encounter (Signed)
-----   Message from Elenora GammaSamuel L Bradshaw, MD sent at 12/01/2015 10:28 AM EST ----- He wasn't sure what brand strips he needed, Flex something? Any Idea.  Thanks ! Sam

## 2015-12-08 NOTE — Telephone Encounter (Signed)
I called patient to see what type glucometer he has but no answer - LMOVM.  I also called [facility].  They state that patient is in their Diabetes Club and a prescription for testing supplies is not needed but noted that he has a Medical sales representativeTrumetrics glucomter.

## 2015-12-11 ENCOUNTER — Telehealth: Payer: Self-pay | Admitting: *Deleted

## 2015-12-11 ENCOUNTER — Ambulatory Visit (INDEPENDENT_AMBULATORY_CARE_PROVIDER_SITE_OTHER): Payer: Medicare Other | Admitting: Nurse Practitioner

## 2015-12-11 DIAGNOSIS — I739 Peripheral vascular disease, unspecified: Secondary | ICD-10-CM

## 2015-12-11 DIAGNOSIS — F17210 Nicotine dependence, cigarettes, uncomplicated: Secondary | ICD-10-CM | POA: Diagnosis not present

## 2015-12-11 DIAGNOSIS — Z4781 Encounter for orthopedic aftercare following surgical amputation: Secondary | ICD-10-CM | POA: Diagnosis not present

## 2015-12-11 DIAGNOSIS — I2510 Atherosclerotic heart disease of native coronary artery without angina pectoris: Secondary | ICD-10-CM | POA: Diagnosis not present

## 2015-12-11 DIAGNOSIS — E1151 Type 2 diabetes mellitus with diabetic peripheral angiopathy without gangrene: Secondary | ICD-10-CM | POA: Diagnosis not present

## 2015-12-11 DIAGNOSIS — I10 Essential (primary) hypertension: Secondary | ICD-10-CM | POA: Diagnosis not present

## 2015-12-11 DIAGNOSIS — F200 Paranoid schizophrenia: Secondary | ICD-10-CM | POA: Diagnosis not present

## 2015-12-11 LAB — PROTIME-INR: INR: 3.2 — AB (ref <=1.1)

## 2015-12-11 NOTE — Telephone Encounter (Signed)
Protime 38.3  INR 3.2. Call [place] with instructions. Also, [facility]mg  [facility]

## 2015-12-11 NOTE — Telephone Encounter (Signed)
Hold todays dose the n continue reglar dose- recheck in 2 weeks

## 2015-12-15 NOTE — Telephone Encounter (Signed)
Hector AngCathy did not get a call about the instructions until this AM.  She will recheck levels again .  She would like for you to send in a script for a 10 mg warfarin pill so the patient does not run short with the various doses he has to take.

## 2015-12-18 ENCOUNTER — Telehealth: Payer: Self-pay | Admitting: *Deleted

## 2015-12-18 ENCOUNTER — Ambulatory Visit (INDEPENDENT_AMBULATORY_CARE_PROVIDER_SITE_OTHER): Payer: Medicare Other | Admitting: Pharmacist

## 2015-12-18 DIAGNOSIS — I739 Peripheral vascular disease, unspecified: Secondary | ICD-10-CM | POA: Diagnosis not present

## 2015-12-18 LAB — POCT INR: INR: 1.7

## 2015-12-18 NOTE — Progress Notes (Signed)
INR checked by home health nurse in patient's home  Billing once per month interupertation fee.  Patient diagnosis - PAD Procedure code if [id number]

## 2015-12-18 NOTE — Telephone Encounter (Signed)
Protime 20.3  INR 1.7  Took coumadin late late lastnight,

## 2015-12-18 NOTE — Telephone Encounter (Signed)
Subtherapeutic anticoagulation.  Recommend take 2 tablets instead of just 1 tablet today - Friday, January 6th,  then continue regular dose of 1 tablet on mondays and fridays and 2 tablet all other days recheck in 1 week.  Patient and Lynden AngCathy with [facility] notified.

## 2015-12-21 ENCOUNTER — Ambulatory Visit (INDEPENDENT_AMBULATORY_CARE_PROVIDER_SITE_OTHER): Payer: Medicare Other | Admitting: Pharmacist

## 2015-12-21 ENCOUNTER — Telehealth: Payer: Self-pay | Admitting: *Deleted

## 2015-12-21 DIAGNOSIS — I739 Peripheral vascular disease, unspecified: Secondary | ICD-10-CM

## 2015-12-21 LAB — POCT INR: INR: 2

## 2015-12-21 NOTE — Telephone Encounter (Signed)
Therapeutic anticoagulation.  Recommend continue current warfarin dose of 1 tablet on mondays and fridays and 2 tablet all other days recheck in 1 week.  I notified [name] with [facility] and she will recheck in 1 week.   Tye [place] also told me that patient was living in his car currently.   The social worker with [facility] has met with him and given him information about low income housing and encouraged him to go to [employer] for assistance.  He has also been provided with information about homeless shelters in [place] and [place] by [facility].  I tried to call patient but had to [place].  I advised him to continue current warfain dose and also left numbers for [place] and [employer].

## 2015-12-21 NOTE — Telephone Encounter (Signed)
protime 24.0  INR 2.0

## 2015-12-21 NOTE — Progress Notes (Signed)
INR checked by homoe health agency in patient's home / car.   No charge for review

## 2015-12-22 ENCOUNTER — Telehealth: Payer: Self-pay | Admitting: *Deleted

## 2015-12-25 ENCOUNTER — Telehealth: Payer: Self-pay | Admitting: *Deleted

## 2015-12-25 NOTE — Telephone Encounter (Signed)
Patient called and reminded of appt with Dr Wendi Snipes 12/31/15 and also told about appt with me in February. Patient did report that he met with someone at Social Services to day and they are helping him find housing.

## 2015-12-25 NOTE — Telephone Encounter (Signed)
He has some new type of insurance and [facility] cannot continue to see him. He does not have a card or know how change happened?? We will need to resume protimes.

## 2015-12-31 ENCOUNTER — Ambulatory Visit: Payer: Self-pay | Admitting: Family Medicine

## 2016-01-01 ENCOUNTER — Encounter: Payer: Self-pay | Admitting: Family Medicine

## 2016-01-02 DIAGNOSIS — M79605 Pain in left leg: Secondary | ICD-10-CM | POA: Diagnosis not present

## 2016-01-02 DIAGNOSIS — L089 Local infection of the skin and subcutaneous tissue, unspecified: Secondary | ICD-10-CM | POA: Diagnosis not present

## 2016-01-04 MED ORDER — LISINOPRIL 5 MG PO TABS: 5.0000 mg | ORAL_TABLET | Freq: Every day | ORAL | Status: DC

## 2016-01-04 NOTE — Addendum Note (Signed)
Addended by: Almeta Monas on: 01/04/2016 05:57 PM   Modules accepted: Orders

## 2016-01-04 NOTE — Telephone Encounter (Signed)
FYI,     Patient aware of need for lisinopril and new script was sent in to [facility]. He has a follow up appointment with our Pharm D.

## 2016-01-15 ENCOUNTER — Other Ambulatory Visit: Payer: Self-pay | Admitting: Pharmacist

## 2016-01-21 ENCOUNTER — Ambulatory Visit: Payer: Self-pay | Admitting: Pharmacist

## 2016-01-22 ENCOUNTER — Encounter: Payer: Self-pay | Admitting: Family Medicine

## 2016-01-27 ENCOUNTER — Telehealth: Payer: Self-pay | Admitting: Pharmacist

## 2016-01-27 NOTE — Telephone Encounter (Signed)
Patient is past due to have INR checked - he missed appt last week. I have been unable to get in contact with patient as he has not answered phone calls and there is no VM to leave message.  I did try to make contact with his emergency contact - Hector Green.  I had to LM on her VM and asked that she have Hector Green call the office ASAP.

## 2016-01-29 ENCOUNTER — Ambulatory Visit: Payer: Self-pay | Admitting: Pharmacist

## 2016-02-10 ENCOUNTER — Telehealth: Payer: Self-pay | Admitting: Pharmacist

## 2016-02-10 NOTE — Telephone Encounter (Signed)
Tried to call patient to make appt for recheck Protime and to see PCP for diabetes recheck. NA and unable to LMOVM.

## 2016-02-18 ENCOUNTER — Telehealth: Payer: Self-pay | Admitting: Pharmacist

## 2016-02-18 NOTE — Telephone Encounter (Signed)
I have tried to call patient numerous times to make appt for protime.  I have not been able to reach him. I did speak with his emergency contact - Thelma BargeCindy Anthony and she states she will give him a message to call office.  She did mention that he still does not have a regular apartment or home.

## 2016-02-24 ENCOUNTER — Encounter: Payer: Self-pay | Admitting: Pharmacist

## 2016-02-24 NOTE — Progress Notes (Signed)
Patient ID: Hector SalmonJohn W Green, male   DOB: 1960/12/23, 55 y.o.   MRN: [id number] Have been unable to reach patient to discuss checking protimes or to make appt.  [facility].

## 2016-02-25 DIAGNOSIS — Z89511 Acquired absence of right leg below knee: Secondary | ICD-10-CM | POA: Diagnosis not present

## 2016-03-08 NOTE — H&P (Signed)
HISTORY AND PHYSICAL  Hector SalmonJohn W Green is a 55 y.o. male patient with CC: painful teeth. Referred by general dentist for full mouth extractions for denture fabrication.  No diagnosis found.  Past Medical History  Diagnosis Date  . CAD (coronary artery disease) 2011    [place]iv[place] CAB[place]  . Type 2 diabetes mellitus (HCC)   . Hyperlipidemia   . Peripheral arterial disease (HCC)   . Abnormal nuclear stress test 05/06/2015  . Myocardial infarction (HCC) 2011  . Paranoid schizophrenia (HCC)   . Essential hypertension     not on medications at this time    No current facility-administered medications for this encounter.   Current Outpatient Prescriptions  Medication Sig Dispense Refill  . aspirin EC 81 MG tablet Take 81 mg by mouth daily.    Marland Kitchen. gabapentin (NEURONTIN) 300 MG capsule Take 300 mg by mouth 2 (two) times daily.    Marland Kitchen. glucose blood test strip Trumetrics glucose test strips.  Use to check BG up to BID. Dx:  Type 2 DM, uncontrolled with complications 100 each   . lisinopril (PRINIVIL,ZESTRIL) 5 MG tablet Take 1 tablet (5 mg total) by mouth daily. 90 tablet 3  . metFORMIN (GLUCOPHAGE) 500 MG tablet Take 1 tablet (500 mg total) by mouth 2 (two) times daily with a meal. 180 tablet 1  . warfarin (COUMADIN) 5 MG tablet TAKE 2 TABLETS (10 MG TOTAL) BY MOUTH DAILY WITH SUPPER. OR AS INSTRUCTED BY ANTICOAGULATION CLINIC. 60 tablet 1  . [DISCONTINUED] glimepiride (AMARYL) 4 MG tablet Take 1 tablet (4 mg total) by mouth daily before breakfast. (Patient not taking: Reported on 08/10/2015) 30 tablet 1   Allergies  Allergen Reactions  . Fish-Derived Products Anaphylaxis  . Penicillins Anaphylaxis    Has patient had a PCN reaction causing immediate rash, facial/tongue/throat swelling, SOB or lightheadedness with hypotension: Yes Has patient had a PCN reaction causing severe rash involving mucus membranes or skin necrosis: No Has patient had a PCN reaction that required  hospitalization emergency room visit Has patient had a PCN reaction occurring within the last 10 years: No If all of the above answers are "NO", then may proceed with Cephalosporin use.   Active Problems:   * No active hospital problems. *  Vitals: There were no vitals taken for this visit. Lab results:No results found for this or any previous visit (from the past 24 hour(s)). Radiology Results: No results found. General appearance: alert, cooperative and no distress Head: Normocephalic, without obvious abnormality, atraumatic Eyes: negative Nose: Nares normal. Septum midline. Mucosa normal. No drainage or sinus tenderness. Throat: multiple dental caries and periodontitis. Pharynx clear.Bilateral mandibular lingual tori. Neck: no adenopathy, supple, symmetrical, trachea midline and thyroid not enlarged, symmetric, no tenderness/mass/nodules Resp: clear to auscultation bilaterally Cardio: regular rate and rhythm, S1, S2 normal, no murmur, click, rub or gallop Extremities: s/p R BKA  Assessment: Nonrestorable teeth 6, 7, 10, 11, 13, 20, 21, 22, 23, 24, 25, 26, 27, 28, 29, 30, bilateral mandibular lingual tori  Plan:Full mouth extractions with alveoloplasty. Removal bilateral mandibular lingual tori. GA. Day surgery.   [name] LopesJENSEN,Plummer Matich M 03/08/2016

## 2016-03-10 NOTE — Progress Notes (Signed)
Called Dr. Randa EvensJensen's office to see if they had another phone # for pt and if they were aware that pt is on Coumadin. They state they did not know that pt was on Coumadin. I called pt and verified with him that he is on Coumadin and he said yes he was. When I asked him why he didn't tell Dr. Barbette MerinoJensen this, he stated "he didn't ask me". Spoke with Sam, surgery scheduler after speaking with pt and she states Dr. Barbette MerinoJensen is now aware and pt will have to be cancelled.

## 2016-03-11 ENCOUNTER — Ambulatory Visit (HOSPITAL_COMMUNITY): Admission: RE | Admit: 2016-03-11 | Payer: Medicaid Other | Source: Ambulatory Visit | Admitting: Oral Surgery

## 2016-03-11 ENCOUNTER — Encounter (HOSPITAL_COMMUNITY): Admission: RE | Payer: Self-pay | Source: Ambulatory Visit

## 2016-03-11 SURGERY — EXTRACTION, TOOTH, MOLAR
Anesthesia: General

## 2016-03-17 DIAGNOSIS — M7062 Trochanteric bursitis, left hip: Secondary | ICD-10-CM | POA: Diagnosis not present

## 2016-03-17 DIAGNOSIS — G546 Phantom limb syndrome with pain: Secondary | ICD-10-CM | POA: Diagnosis not present

## 2016-03-17 DIAGNOSIS — E1142 Type 2 diabetes mellitus with diabetic polyneuropathy: Secondary | ICD-10-CM | POA: Diagnosis not present

## 2016-03-17 DIAGNOSIS — Z89511 Acquired absence of right leg below knee: Secondary | ICD-10-CM | POA: Diagnosis not present

## 2016-04-05 ENCOUNTER — Other Ambulatory Visit: Payer: Self-pay | Admitting: Family Medicine

## 2016-04-05 ENCOUNTER — Other Ambulatory Visit: Payer: Self-pay | Admitting: Pharmacist

## 2016-04-05 NOTE — Telephone Encounter (Signed)
Last seen 12/01/15  Dr Ermalinda MemosBradshaw  Requesting 90 day supply

## 2016-04-05 NOTE — Telephone Encounter (Signed)
Sent Metformin, last bmp with good renal function. Given he hasnt had a check in 3 months I am concerned about the safety of continuing, will discuss with our clinical pharmacist and make a decision.   [name] Onnika Siebel, MD [facility] 04/05/2016, 2:57 PM

## 2016-04-05 NOTE — Telephone Encounter (Signed)
Last seen 12/01/15  Dr Ermalinda Memosbradshaw

## 2016-04-06 NOTE — Telephone Encounter (Signed)
I have discussed this with Elvin Soammy Eckerd, her clinical pharmacist tothe patient well. He is been released from her practice due to multiple no shows.  She has attempted to contact him several times as documented in the chart previously. Neither of us feel that it's safe to continue prescribing the Coumadin when he has no follow-up here and is not being monitored.  The prescription has been refused for now.  [name] Colton Tassin, MD [facility] 04/06/2016, 1:17 PM

## 2016-04-13 ENCOUNTER — Telehealth: Payer: Self-pay | Admitting: Pharmacist

## 2016-04-13 NOTE — Telephone Encounter (Signed)
Received call from oral surgeons office about recommendations for holding warfarin prior to up coming surgery.  Patient has been scheduled for surgery last week but on day of surgery he revealed that he was taking warfarin and surgery was cancelled.  He told Dr Randa EvensJensen's office that Dr Ermalinda MemosBradshaw was managing warfain however he has been dismissed from our practice due to non compliance with follow up visits and INR has not been checked by our office since 12/21/2015.  I spoke with patient and he has not seen anyone else. Will discuss with Dr Ermalinda MemosBradshaw and practice manager about how to proceed going forward.

## 2016-04-15 ENCOUNTER — Ambulatory Visit (INDEPENDENT_AMBULATORY_CARE_PROVIDER_SITE_OTHER): Payer: Medicare Other | Admitting: Family Medicine

## 2016-04-15 ENCOUNTER — Encounter: Payer: Self-pay | Admitting: Family Medicine

## 2016-04-15 VITALS — BP 120/61 | HR 78 | Temp 97.2°F | Ht 70.0 in | Wt 181.0 lb

## 2016-04-15 DIAGNOSIS — IMO0002 Reserved for concepts with insufficient information to code with codable children: Secondary | ICD-10-CM

## 2016-04-15 DIAGNOSIS — Z7901 Long term (current) use of anticoagulants: Secondary | ICD-10-CM

## 2016-04-15 DIAGNOSIS — E1165 Type 2 diabetes mellitus with hyperglycemia: Secondary | ICD-10-CM

## 2016-04-15 DIAGNOSIS — I739 Peripheral vascular disease, unspecified: Secondary | ICD-10-CM | POA: Diagnosis not present

## 2016-04-15 DIAGNOSIS — E118 Type 2 diabetes mellitus with unspecified complications: Secondary | ICD-10-CM

## 2016-04-15 LAB — BAYER DCA HB A1C WAIVED: HB A1C: 10.4 % — AB (ref <7.0)

## 2016-04-15 LAB — COAGUCHEK XS/INR WAIVED
INR: 3.6 — ABNORMAL HIGH (ref 0.9–1.1)
Prothrombin Time: 43.3 s

## 2016-04-15 MED ORDER — GLIMEPIRIDE 2 MG PO TABS: 2.0000 mg | ORAL_TABLET | Freq: Every day | ORAL | Status: DC

## 2016-04-15 MED ORDER — MUPIROCIN 2 % EX OINT: 1.0000 "application " | TOPICAL_OINTMENT | Freq: Two times a day (BID) | CUTANEOUS | Status: DC

## 2016-04-15 MED ORDER — METFORMIN HCL 1000 MG PO TABS: 1000.0000 mg | ORAL_TABLET | Freq: Two times a day (BID) | ORAL | Status: AC

## 2016-04-15 NOTE — Patient Instructions (Addendum)
Great to see you!  Try mupirocin on your sore twice daily, keep it clean  I have changed your diabetic medicines, I have sent new porescripotions Increase metformin- 1 000 mg twice daily Start glimepride once daily   Lets get you to see Tammy in 1 month for INR check and diabetes management and come back to me in 3 months  Please follow up with vascular surgery, call [phone] for appt  Coumadin dosing 2 pills daily except Monday and Friday when you will take 1 pill

## 2016-04-15 NOTE — Progress Notes (Signed)
HPI  Patient presents today here for follow-up of diabetes, Coumadin use, and a new leg lesion.  Leg lesion Anterior left lower leg with initially a blister that popped, he's kept it covered and clean over the last 2 months approximately. No tenderness, warmth, or spreading lately. He states that it's pretty much been the same as that has been It does drain clear to 10 fluid if he does not keep a bandage on it.  Diabetes Watching diet minimally Average fasting blood sugar is 150to 175 Taking metformin twice daily without issue  Coumadin use, peripheral artery disease He is unaware why he is on Coumadin, on chart review he had a thrombosis of the right femoral-tibial bypass and was started on Coumadin at that time. That was nearly a year ago He's had poor follow-up lately. He takes 10 mg of Coumadin everyday His leafy green intake is stable. He has an upcoming tooth extraction planned but does not know the date  PMH: Smoking status noted ROS: Per HPI  Objective: BP 120/61 mmHg  Pulse 78  Temp(Src) 97.2 F (36.2 C) (Oral)  Ht  (1.778 m)  Wt 181 lb (82.101 kg)  BMI 25.97 kg/m2 Gen: NAD, alert, cooperative with exam HEENT: NCAT, poor dentition CV: RRR, good S1/S2, no murmur Resp: CTABL, no wheezes, non-labored Ext: Right sided BKA, left side with hemosiderin staining, lesion described below, and trace pitting edema Neuro: Alert and oriented, walking with a walker  Skin: Shallow ulceration on the anterior left lower leg measuring 19 mm x 16 mm Clear tan fluid on bandage  Assessment and plan:  # T2DM Control slipping, increase metformin, adding Amaryl, A1C 10.3 Discussed fasting blood sugar check and log, return to clinic in one month for follow-up with me, however he states that he is changing offices to be closer to home so I encouraged him to follow-up with them instead No hypoglycemia Status post right BKA, he states because of diabetes, however he has  severe PAD so true etiology is unclear  # Leg lesion Persistent for 2 months No apparent active infection Treatment mupirocin ointment and routine dressing changes Recommended follow-up in one month, he will follow-up with his new practice.  # Chronic anticoagulation For PAD, about 11 months ago he had thrombosis of the right sided femoral tibial bypass Unclear whether this needs to be chronic anticoagulation or if he is ready for trial off Recommended follow-up with vascular surgery further recommendations INR is 3.6 today Decreasing weekly Coumadin dose by 10 mg, total dose is 70 mg previously and now 11   Social situation, [place] to [place] area to be closer to his son. He started homelessness for a while and is now living with his ex-wife who is present today.  He is changing family practices to [facility] due to living closer to that practice currently. I encouraged him to call follow-up closely with them and welcomed him to come here if he needs.  He was previously discharged from our practice due to no-shows, however he's been reactivated with this visit. We understand history struggle with homelessness and want to provide good care for him.   Orders Placed This Encounter  Procedures  . Bayer DCA Hb A1c Waived  . CoaguChek XS/INR Waived    Meds ordered this encounter  Medications  . metFORMIN (GLUCOPHAGE) 1000 MG tablet    Sig: Take 1 tablet (1,000 mg total) by mouth 2 (two) times daily with a meal.  Dispense:  180 tablet    Refill:  3    Generic WUJ:[id number]:GLUCOPHAGE  500MG     N O T I C E    Last quantity doesn't match original quantity  . glimepiride (AMARYL) 2 MG tablet    Sig: Take 1 tablet (2 mg total) by mouth daily with breakfast.    Dispense:  90 tablet    Refill:  0  . mupirocin ointment (BACTROBAN) 2 %    Sig: Place 1 application into the nose 2 (two) times daily.    Dispense:  22 g    Refill:  0    [name] Bradshaw, MD [facility] 04/15/2016, 10:41 AM

## 2016-04-15 NOTE — Telephone Encounter (Signed)
Seeing patient in clinic today.   Hector SinkSam Angelli Baruch, MD [facility] 04/15/2016, 10:13 AM

## 2016-04-26 DIAGNOSIS — I87332 Chronic venous hypertension (idiopathic) with ulcer and inflammation of left lower extremity: Secondary | ICD-10-CM | POA: Diagnosis not present

## 2016-04-26 DIAGNOSIS — I70261 Atherosclerosis of native arteries of extremities with gangrene, right leg: Secondary | ICD-10-CM | POA: Diagnosis not present

## 2016-05-12 DIAGNOSIS — Z713 Dietary counseling and surveillance: Secondary | ICD-10-CM | POA: Diagnosis not present

## 2016-05-12 DIAGNOSIS — E1159 Type 2 diabetes mellitus with other circulatory complications: Secondary | ICD-10-CM | POA: Diagnosis not present

## 2016-05-12 DIAGNOSIS — D689 Coagulation defect, unspecified: Secondary | ICD-10-CM | POA: Diagnosis not present

## 2016-05-12 DIAGNOSIS — I10 Essential (primary) hypertension: Secondary | ICD-10-CM | POA: Diagnosis not present

## 2016-05-12 DIAGNOSIS — E1149 Type 2 diabetes mellitus with other diabetic neurological complication: Secondary | ICD-10-CM | POA: Diagnosis not present

## 2016-05-24 ENCOUNTER — Ambulatory Visit: Payer: Medicaid Other | Attending: Orthopedic Surgery | Admitting: Physical Therapy

## 2016-05-24 DIAGNOSIS — M6281 Muscle weakness (generalized): Secondary | ICD-10-CM | POA: Insufficient documentation

## 2016-05-24 DIAGNOSIS — I70261 Atherosclerosis of native arteries of extremities with gangrene, right leg: Secondary | ICD-10-CM | POA: Diagnosis not present

## 2016-05-24 DIAGNOSIS — R29898 Other symptoms and signs involving the musculoskeletal system: Secondary | ICD-10-CM | POA: Diagnosis present

## 2016-05-24 DIAGNOSIS — I87332 Chronic venous hypertension (idiopathic) with ulcer and inflammation of left lower extremity: Secondary | ICD-10-CM | POA: Diagnosis not present

## 2016-05-24 DIAGNOSIS — Z89511 Acquired absence of right leg below knee: Secondary | ICD-10-CM | POA: Diagnosis not present

## 2016-05-24 DIAGNOSIS — M25661 Stiffness of right knee, not elsewhere classified: Secondary | ICD-10-CM | POA: Diagnosis present

## 2016-05-24 DIAGNOSIS — R2681 Unsteadiness on feet: Secondary | ICD-10-CM | POA: Diagnosis not present

## 2016-05-24 DIAGNOSIS — R2689 Other abnormalities of gait and mobility: Secondary | ICD-10-CM | POA: Insufficient documentation

## 2016-05-24 DIAGNOSIS — F17210 Nicotine dependence, cigarettes, uncomplicated: Secondary | ICD-10-CM | POA: Diagnosis not present

## 2016-05-24 NOTE — Therapy (Signed)
[facility] [address] [place], [place], [address] Phone: [phone]   Fax:  [phone]  Physical Therapy Evaluation  Patient Details  Name: Hector Green MRN: [id number] Date of Birth: Jan 21, 1961 Referring Provider: Aldean Baker, MD  Encounter Date: 05/24/2016     05/24/16 1422  PT Visits / Re-Eval  Visit Number 1  Number of Visits 17  Date for PT Re-Evaluation 07/23/16  Authorization  Authorization Type Medicare G codes needed  PT Time Calculation  PT Start Time 1410  PT Stop Time 1443  PT Time Calculation (min) 33 min  PT - End of Session  Activity Tolerance Patient tolerated treatment well  Behavior During Therapy [facility] for tasks assessed/performed    Past Medical History  Diagnosis Date  . CAD (coronary artery disease) 2011    Multivessel s/p CABG in [place]  . Type 2 diabetes mellitus (HCC)   . Hyperlipidemia   . Peripheral arterial disease (HCC)   . Abnormal nuclear stress test 05/06/2015  . Myocardial infarction (HCC) 2011  . Paranoid schizophrenia (HCC)   . Essential hypertension     not on medications at this time    Past Surgical History  Procedure Laterality Date  . Coronary artery bypass graft  2011    Three vessel by report  . Abdominal aortagram N/A 03/30/2015    Procedure: ABDOMINAL [name];  Surgeon: Fransisco Hertz, MD;  Location: [facility] CATH LAB;  Service: Cardiovascular;  Laterality: N/A;  . Lower extremity angiogram N/A 03/30/2015    Procedure: LOWER EXTREMITY ANGIOGRAM;  Surgeon: Fransisco Hertz, MD;  Location: [facility] CATH LAB;  Service: Cardiovascular;  Laterality: N/A;  . Cardiac catheterization N/A 05/06/2015    Procedure: Left Heart Cath and Cors/Grafts Angiography;  Surgeon: Tonny Bollman, MD;  Location: [facility] INVASIVE CV LAB;  Service: Cardiovascular;  Laterality: N/A;  . Femoral-tibial bypass graft Right 05/13/2015    Procedure: BYPASS GRAFT RIGHT FEMORAL-POSTERIOR TIBIAL ARTERY USING  LEFT NONREVERSED TRANSLOCATED GREATER SAPPHENOUS VEIN;  Surgeon: Pryor Ochoa, MD;  Location: [facility] OR;  Service: Vascular;  Laterality: Right;  . Vein harvest Left 05/13/2015    Procedure: LEFT GREATER SAPPHENOUS VEIN HARVEST;  Surgeon: Pryor Ochoa, MD;  Location: [facility] OR;  Service: Vascular;  Laterality: Left;  . Endarterectomy femoral Right 05/13/2015    Procedure: RIGHT COMMON FEMORAL, SUPERFICIAL FEMORAL AND PROFUNDA ENDARTERECTOMY ;  Surgeon: Pryor Ochoa, MD;  Location: [facility] OR;  Service: Vascular;  Laterality: Right;  . Patch angioplasty Right 05/13/2015    Procedure: RIGHT FEMORAL ARTERY PATCH ANGIOPLASTY;  Surgeon: Pryor Ochoa, MD;  Location: [facility] OR;  Service: Vascular;  Laterality: Right;  . Intraoperative arteriogram Right 05/13/2015    Procedure: RIGHT LOWER LEG INTRA OPERATIVE ARTERIOGRAM;  Surgeon: Pryor Ochoa, MD;  Location: [facility] OR;  Service: Vascular;  Laterality: Right;  . Thrombectomy femoral artery Right 05/13/2015    Procedure: THROMBECTOMY RIGHT FEMORAL-PROXIMAL POSTERIOR TIBAIL ARTERY BYPASS GRAFT;  Surgeon: Larina Earthly, MD;  Location: [facility] OR;  Service: Vascular;  Laterality: Right;  . Colonoscopy    . Femoral-tibial bypass graft Right 05/27/2015    Procedure: THROMBECTOMY OF RIGHT FEMORAL-POSTERIOR TIBIAL ARTERY SAPHENOUS VEIN BYPASS GRAFT ;  Surgeon: Pryor Ochoa, MD;  Location: [facility] OR;  Service: Vascular;  Laterality: Right;  . Intraoperative arteriogram Right 05/27/2015    Procedure: INTRA OPERATIVE ARTERIOGRAM;  Surgeon: Pryor Ochoa, MD;  Location: [facility] OR;  Service: Vascular;  Laterality: Right;  . Below  knee leg amputation Right 07/08/15    Dr. Lajoyce Cornersuda  . Amputation Right 07/08/2015    Procedure: AMPUTATION BELOW RIGHT KNEE;  Surgeon: Nadara MustardMarcus Duda V, MD;  Location: [facility] OR;  Service: Orthopedics;  Laterality: Right;  . Stump revision Right 08/28/2015    Procedure: Revision Right Below Knee Amputation;  Surgeon: Nadara MustardMarcus Duda V, MD;  [facility];  Service: Orthopedics;   Laterality: Right;  . Stump revision Right 10/30/2015    Procedure: Revision Right Below Knee Amputation;  Surgeon: Nadara MustardMarcus Duda V, MD;  [facility];  Service: Orthopedics;  Laterality: Right;    There were no vitals filed for this visit.     05/24/16 1422  Patient Literacy  How often do you need to have someone help you when you read instructions, pamphlets, or other written materials from your doctor or pharmacy? 1 - Never  GafferLanguage Assistant  Interpreter Needed? No  Comments  Comments No reports or signs of abuse or neglect noted.     05/24/16 1411  Symptoms/Limitations  Subjective This 54yo male underwent a Fem-Pop Bypass surgery & thrombectomy on 05/13/2015. He had a second thrombectoy on 05/27/2015. He required a Right Transtibial Amputation on 07/08/2015 which had difficulty healing with 2 required revision surgeries (08/28/2015 & 10/30/2015). He had slow, delayed healing. He became deconditioned with prolonged limited mobility. He recieved his first prosthesis on 05/13/2016 and is dependent in care & use. He presents to PT for evaluation & prosthetic training.   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 No/denies       05/24/16 1400  Assessment  Medical Diagnosis Right Transtibial Amputation  Referring Provider Aldean BakerMarcus Duda, MD  Onset Date/Surgical Date 05/13/16 (prosthesis delivery)  Precautions  Precautions None  Restrictions  Weight Bearing Restrictions No  Balance Screen  Has the patient fallen in the past 6 months Yes  How many times? 3 (denies injuries)  Has the patient had a decrease in activity level because of a fear of falling?  No  Is the patient reluctant to leave their home because of a fear of falling?  No  Home Set designernvironment  Living Environment Private residence  Living Arrangements  Non-relatives/Friends;Children (Living with others due to amputation, wishes to live alone)  Available Help at Discharge Family;Friend(s)  Type of Home House  Home Access Stairs to enter  Entrance Stairs-Number of Steps 2  Entrance Stairs-Rails Left  Home Layout One level  Home Equipment Walker - 4 wheels  Additional Comments 3 young children, 2 teenagers, 4 adults and 3 dogs in the home   Prior Function  Level of Independence Independent  Vocation On disability  Leisure Draw, Aeronautical engineerlandscaping, painting  Observation/Other Assessments  Focus on Therapeutic Outcomes (FOTO)  NP patient did not complete  ROM / Strength  AROM / PROM / Strength PROM;Strength  PROM  PROM Assessment Site Knee  Right/Left Knee Right  Right Knee Extension -25  Strength  Overall Strength Within functional limits for tasks performed  Overall Strength Comments UE and gross hip flexion WNL  Transfers  Transfers Sit to Stand;Stand to Sit  Sit to Stand 5: Supervision;With upper extremity assist;With armrests;From chair/3-in-1;4: Min assist (to RW for stabilization, needed MinA from low mat table)  Sit to Stand Details (indicate cue type and reason) unable to perform without UE assist  Stand to Sit 5: Supervision;With  upper extremity assist;With armrests;To chair/3-in-1;Uncontrolled descent (uncontrolled descent to low mat table)  Ambulation/Gait  Ambulation/Gait Yes  Ambulation/Gait Assistance 5: Supervision  Ambulation/Gait Assistance Details From lobby, pt hopped on LLE with RW as he was not wearing prosthesis. He did not bring the other shoe for his LLE and was wearing a flip-flop sandal on left foot. He displayed external rotation of LLE, increased knee flexion during initial contact on the LLE.   Ambulation Distance (Feet) 80 Feet  Assistive device Prosthesis;Rolling walker  Gait Pattern Step-to pattern;Decreased stance time - right;Decreased stride length;Decreased weight shift to right;Trendelenburg;Decreased  trunk rotation;Right flexed knee in stance;Decreased step length - left;Decreased hip/knee flexion - right;Right hip hike;Trunk flexed;Abducted- right;Poor foot clearance - right  Ambulation Surface Indoor;Level  Gait velocity 1.02 ft/s (<1.8 ft/sec indicates fall risk; <1.31 ft/sec household amb)  Standardized Balance Assessment  Standardized Balance Assessment Berg Balance Test  Berg Balance Test  Sit to Stand 1 (uses RW to stabilize)  Standing Unsupported 3  Sitting with Back Unsupported but Feet Supported on Floor or Stool 4  Stand to Sit 1  Transfers 3  Standing Unsupported with Eyes Closed 0  Standing Ubsupported with Feet Together 0  From Standing, Reach Forward with Outstretched Arm 1  From Standing Position, Pick up Object from Floor 0  From Standing Position, Turn to Look Behind Over each Shoulder 0  Turn 360 Degrees 0  Standing Unsupported, Alternately Place Feet on Step/Stool 0  Standing Unsupported, One Foot in Front 0  Standing on One Leg 3  Total Score 16     05/24/16 1400  Transfers  Transfers Sit to Stand;Stand to Sit  Sit to Stand 5: Supervision;With upper extremity assist;With armrests;From chair/3-in-1;4: Min assist (to RW for stabilization, needed MinA from low mat table)  Sit to Stand Details (indicate cue type and reason) unable to perform without UE assist  Stand to Sit 5: Supervision;With upper extremity assist;With armrests;To chair/3-in-1;Uncontrolled descent (uncontrolled descent to low mat table)  Ambulation/Gait  Ambulation/Gait Yes  Ambulation/Gait Assistance 5: Supervision  Ambulation/Gait Assistance Details From lobby, pt hopped on LLE with RW as he was not wearing prosthesis. He did not bring the other shoe for his LLE and was wearing a flip-flop sandal on left foot. He displayed external rotation of LLE, increased knee flexion during initial contact on the LLE.   Ambulation Distance (Feet) 80 Feet  Assistive device Prosthesis;Rolling walker   Gait Pattern Step-to pattern;Decreased stance time - right;Decreased stride length;Decreased weight shift to right;Trendelenburg;Decreased trunk rotation;Right flexed knee in stance;Decreased step length - left;Decreased hip/knee flexion - right;Right hip hike;Trunk flexed;Abducted- right;Poor foot clearance - right  Ambulation Surface Indoor;Level  Gait velocity 1.02 ft/s (<1.8 ft/sec indicates fall risk; <1.31 ft/sec household amb)  Prosthetics  Prosthetic Care Comments  Wear 2hrs 2x a day, take off to dry. Stand with support, can walk with walker if neccessary.  Current prosthetic wear tolerance (days/week)  reports daily wear since delivery 11 days ago.   Current prosthetic wear tolerance (#hours/day)  reports wear 45 minutes 1-2 times /day  Current prosthetic weight-bearing tolerance (hours/day)  Pt tolerated 5 minutes of standing with partial weight on prosthesis with no complaints of pain or discomfort.   Edema non-pitting.  Residual limb condition  Distal limb dry, scar adhered with minimal to no glide over bone or underlying tissue. minimal hair growth. Darker coloring with gray complexion. Cylinderical shape. normal temperature. No open areas.   Education Provided Skin check;Residual limb care;Prosthetic cleaning;Correct ply sock  adjustment;Proper Donning;Proper wear schedule/adjustment  Person(s) Educated Patient  Education Method Explanation;Demonstration;Tactile cues;Verbal cues  Education Method Verbalized understanding;Needs further instruction        05/24/16 1423  Plan  Clinical Impression Statement Pt is a 55 yr old male here following a R transtibial amputation in July, the patient underwent 3 revisions since this time. Currently the pt is dependent with all prosthetic care and lack 25* of knee extension of his ampuated limb. He demostrates decreased balance as noted by Sharlene Motts Score of 16/56 indicating an increased risk of falls. Gait speed of 1.02 ft/s indicates limited  household gait & fall risk. He has limited wear of prosthesis which limits function throughout his day. He is dependent in safe use & care of prosthesis with risk of skin issues without proper instruction. Skilled PT is needed for restoring fucntional mobilty in the community and home.    Pt will benefit from skilled therapeutic intervention in order to improve on the following deficits Decreased activity tolerance;Decreased balance;Decreased mobility;Decreased strength;Prosthetic Dependency;Decreased scar mobility;Decreased range of motion;Impaired flexibility;Abnormal gait;Postural dysfunction  Rehab Potential Good  PT Frequency 2x / week  PT Duration Other (comment) (9 weeks (60 days))  PT Treatment/Interventions ADLs/Self Care Home Management;Therapeutic exercise;Therapeutic activities;Functional mobility training;Gait training;Stair training;DME Instruction;Balance training;Neuromuscular re-education;Patient/family education;Orthotic Fit/Training;Prosthetic Training;Manual techniques;Passive range of motion;Scar mobilization  PT Next Visit Plan Review prosthetic care, prosthetic gait including barriers, HEP mid-line at sink.   Consulted and Agree with Plan of Care Patient                       05/24/16 1659  PT SHORT TERM GOAL #1  Title Pt will demonstrate proper donning & verbalize proper cleaning of prosthesis (Target Date: 06/22/2016)  Time 1  Period Months  Status New  PT SHORT TERM GOAL #2  Title Patient tolerates wear of prosthesis >8hrs total /day without skin issues or limb tenderness.  (Target Date: 06/22/2016)  Time 1  Period Months  Status New  PT SHORT TERM GOAL #3  Title Patient negotiates ramps, curbs & stairs (1 rail) with RW & prosthesis modified independent.  (Target Date: 06/22/2016)  Time 1  Period Months  Status New  PT SHORT TERM GOAL #4  Title Pt will be able to ambulate 300' with RW or crutches & prosthesis with verbal cues for deviations only,  no balance losses noted.  (Target Date: 06/22/2016)  Time 1  Period Months  Status New  PT SHORT TERM GOAL #5  Title Pt will be able to reach 7" anterior and across midline and pick an object off the floor with supervision without UE support  (Target Date: 06/22/2016)  Time 1  Period Months  Status New       05/24/16 1425  PT LONG TERM GOAL #1  Title Pt will be independent with prosthetic care and residual limb care to enable safe wear & use of prosthesis (Target Date: 07/22/2016)  Time 2  Period Months  Status New  PT LONG TERM GOAL #2  Title Pt will tolerate wear of prothesis >90% of all awake hours to enable increase function throughout his awake hours (Target Date: 07/22/2016)  Time 2  Period Months  Status New  PT LONG TERM GOAL #3  Title Pt will be able to negotiate ramps, curbs and stairs modified independent with LRAD & prosthesis to enable increased mobiltiy in the commnunity (Target Date: 07/22/2016)  Time 2  Period Months  Status New  PT LONG TERM  GOAL #4  Title Pt will be able to ambulate 800' with LRAD & prosthesis including grass, unlevel surfaces to enable increased community mobility (Target Date: 07/22/2016)  Time 2  Period Months  Status New  PT LONG TERM GOAL #5  Title Pt will improve Berg Balance Score to > 36/56 to indicate a decreased risk of falls (Target Date: 07/22/2016)  Time 2  Period Months  Status New            Patient will benefit from skilled therapeutic intervention in order to improve the following deficits and impairments:  Decreased activity tolerance, Decreased balance, Decreased mobility, Decreased strength, Prosthetic Dependency, Decreased scar mobility, Decreased range of motion, Impaired flexibility, Abnormal gait, Postural dysfunction  Visit Diagnosis: Unsteadiness on feet  Other abnormalities of gait and mobility  Other symptoms and signs involving the musculoskeletal system  Stiffness of right knee, not elsewhere  classified  Muscle weakness (generalized)     May 31, 2016 1724  PT G-Codes  Functional Assessment Tool Used Patient tolerates wear up to 45 minutes 1-2 times daily. He is dependent in all aspects of prosthetic care.   Functional Limitation Self care  Self Care Current Status [phone]) CL  Self Care Goal Status (U0454) CI     Problem List Patient Active Problem List   Diagnosis Date Noted  . Toenail deformity 12/01/2015  . Healthcare maintenance 12/01/2015  . Below knee amputation status (HCC) 07/08/2015  . Paranoid schizophrenia, chronic condition (HCC) 06/26/2015  . Numbness and tingling-  Right Leg/ FOOT 06/10/2015  . Pain of right lower extremity 06/10/2015  . Malnutrition of moderate degree (HCC) 05/29/2015  . Femoral-tibial bypass graft occlusion, right (HCC) 05/27/2015  . PAD (peripheral artery disease) (HCC) 05/12/2015  . Type II diabetes mellitus with complication, uncontrolled (HCC) 05/08/2015  . Abnormal nuclear stress test 05/06/2015  . CAD (coronary artery disease) of artery bypass graft 04/29/2015  . Hyperlipidemia 04/29/2015  . Essential hypertension 04/29/2015  . Tobacco abuse 04/29/2015  . Critical lower limb ischemia 03/31/2015  . Atherosclerosis of native arteries of extremity with rest pain [facility]) 03/30/2015   Rollene Fare, SPT  Vladimir Faster, PT, DPT 05/26/2016, 7:02 AM     [facility] [address] [place], [place], [address] Phone: [phone]   Fax:  [phone]  Name: Hector Green MRN: [id number] Date of Birth: [birth year]

## 2016-05-26 ENCOUNTER — Ambulatory Visit: Payer: Medicaid Other | Admitting: Physical Therapy

## 2016-05-31 ENCOUNTER — Ambulatory Visit: Payer: Medicaid Other | Admitting: Physical Therapy

## 2016-06-01 ENCOUNTER — Encounter: Payer: Self-pay | Admitting: Physical Therapy

## 2016-06-02 ENCOUNTER — Ambulatory Visit: Payer: Medicaid Other | Admitting: Physical Therapy

## 2016-06-02 DIAGNOSIS — M6281 Muscle weakness (generalized): Secondary | ICD-10-CM

## 2016-06-02 DIAGNOSIS — R2681 Unsteadiness on feet: Secondary | ICD-10-CM | POA: Diagnosis not present

## 2016-06-02 DIAGNOSIS — R2689 Other abnormalities of gait and mobility: Secondary | ICD-10-CM

## 2016-06-02 DIAGNOSIS — R29898 Other symptoms and signs involving the musculoskeletal system: Secondary | ICD-10-CM

## 2016-06-02 DIAGNOSIS — M25661 Stiffness of right knee, not elsewhere classified: Secondary | ICD-10-CM

## 2016-06-02 NOTE — Therapy (Signed)
[facility] [address] [place], [place], [address] Phone: [phone]   Fax:  [phone]  Physical Therapy Treatment  Patient Details  Name: Hector SalmonJohn W Green MRN: [id number] Date of Birth: [birth year] Referring Provider: Aldean BakerMarcus Duda, MD  Encounter Date: 06/02/2016      PT End of Session - 06/02/16 1618    Visit Number 2   Number of Visits 17   Date for PT Re-Evaluation 07/23/16   Authorization Type Medicare G codes needed   PT Start Time 1447   PT Stop Time 1530   PT Time Calculation (min) 43 min   Activity Tolerance Patient tolerated treatment well   Behavior [facility] for tasks assessed/performed      Past Medical History  Diagnosis Date  . CAD (coronary artery disease) 2011    [place]  . Type 2 diabetes mellitus (HCC)   . Hyperlipidemia   . Peripheral arterial disease (HCC)   . Abnormal nuclear stress test 05/06/2015  . Myocardial infarction (HCC) 2011  . Paranoid schizophrenia (HCC)   . Essential hypertension     not on medications at this time    Past Surgical History  Procedure Laterality Date  . Coronary artery bypass graft  2011    Three vessel by report  . Abdominal aortagram N/A 03/30/2015    Procedure: ABDOMINAL Ronny FlurryAORTAGRAM;  Surgeon: Fransisco HertzBrian L Chen, MD;  [facility];  Service: Cardiovascular;  Laterality: N/A;  . Lower extremity angiogram N/A 03/30/2015    Procedure: LOWER EXTREMITY ANGIOGRAM;  Surgeon: Fransisco HertzBrian L Chen, MD;  [facility];  Service: Cardiovascular;  Laterality: N/A;  . Cardiac catheterization N/A 05/06/2015    Procedure: Left Heart Cath and Cors/Grafts Angiography;  Surgeon: Tonny BollmanMichael Cooper, MD;  [facility];  Service: Cardiovascular;  Laterality: N/A;  . Femoral-tibial bypass graft Right 05/13/2015    Procedure: BYPASS GRAFT RIGHT FEMORAL-POSTERIOR TIBIAL ARTERY USING LEFT NONREVERSED TRANSLOCATED GREATER SAPPHENOUS  VEIN;  Surgeon: Pryor OchoaJames D Lawson, MD;  [facility];  Service: Vascular;  Laterality: Right;  . Vein harvest Left 05/13/2015    Procedure: LEFT GREATER SAPPHENOUS VEIN HARVEST;  Surgeon: Pryor OchoaJames D Lawson, MD;  [facility];  Service: Vascular;  Laterality: Left;  . Endarterectomy femoral Right 05/13/2015    Procedure: RIGHT COMMON FEMORAL, SUPERFICIAL FEMORAL AND PROFUNDA ENDARTERECTOMY ;  Surgeon: Pryor OchoaJames D Lawson, MD;  [facility];  Service: Vascular;  Laterality: Right;  . Patch angioplasty Right 05/13/2015    Procedure: RIGHT FEMORAL ARTERY PATCH ANGIOPLASTY;  Surgeon: Pryor OchoaJames D Lawson, MD;  [facility];  Service: Vascular;  Laterality: Right;  . Intraoperative arteriogram Right 05/13/2015    Procedure: RIGHT LOWER LEG INTRA OPERATIVE ARTERIOGRAM;  Surgeon: Pryor OchoaJames D Lawson, MD;  [facility];  Service: Vascular;  Laterality: Right;  . Thrombectomy femoral artery Right 05/13/2015    Procedure: THROMBECTOMY RIGHT FEMORAL-PROXIMAL POSTERIOR TIBAIL ARTERY BYPASS GRAFT;  Surgeon: Larina Earthlyodd F Early, MD;  [facility];  Service: Vascular;  Laterality: Right;  . Colonoscopy    . Femoral-tibial bypass graft Right 05/27/2015    Procedure: THROMBECTOMY OF RIGHT FEMORAL-POSTERIOR TIBIAL ARTERY SAPHENOUS VEIN BYPASS GRAFT ;  Surgeon: Pryor OchoaJames D Lawson, MD;  [facility];  Service: Vascular;  Laterality: Right;  . Intraoperative arteriogram Right 05/27/2015    Procedure: INTRA OPERATIVE ARTERIOGRAM;  Surgeon: Pryor OchoaJames D Lawson, MD;  [facility];  Service: Vascular;  Laterality: Right;  .  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 Lajoyce Cornersuda  . Amputation Right 07/08/2015    Procedure: AMPUTATION BELOW RIGHT KNEE;  Surgeon: Nadara MustardMarcus Duda V, MD;  [facility] OR;  Service: Orthopedics;  Laterality: Right;  . Stump revision Right 08/28/2015    Procedure: Revision Right Below Knee Amputation;  Surgeon: Nadara MustardMarcus Duda V, MD;  [facility];  Service: Orthopedics;  Laterality: Right;  . Stump revision Right 10/30/2015     Procedure: Revision Right Below Knee Amputation;  Surgeon: Nadara MustardMarcus Duda V, MD;  [facility];  Service: Orthopedics;  Laterality: Right;    There were no vitals filed for this visit.    06/02/16 1621  Symptoms/Limitations  Subjective Pt complains of prosthesis hurting around bottom of residual limb, therefore he has only been wearing it for up to 10 mintues at a time and taking it off due to pain. Reports no falls.  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 Yes  Pain Score 8  Pain Location Leg  Pain Orientation Right  Pain Descriptors / Indicators Tightness  Pain Type Acute pain  Pain Onset In the past 7 days  Pain Frequency Intermittent  Aggravating Factors  wearing prosthesis  Pain Relieving Factors removing prosthesis      06/02/16 0001  Transfers  Transfers Sit to Stand;Stand to Sit  Sit to Stand 5: Supervision;With upper extremity assist;With armrests;From chair/3-in-1;4: Min assist  Sit to Stand Details (indicate cue type and reason) Unable to perform without use of UE  Stand to Sit 5: Supervision;With upper extremity assist;With armrests;To chair/3-in-1;Uncontrolled descent  Stand to Sit Details Uncontrolled descent and decreased weightshift on R  Ambulation/Gait  Ambulation/Gait Yes  Ambulation/Gait Assistance 5: Supervision  Ambulation/Gait Assistance Details Pt. entered clinic by hopping on LLE using RW. He did not have his prosthesis on due to pain. Once prosthesis was donned with correct sock ply pt ambulated 250 ft with RW with supervision, cues on posture, increased step length and for increased right weight shifting in stance.                            Ambulation Distance (Feet) 250 Feet  Assistive device Rolling walker;Prosthesis  Gait Pattern Step-to  pattern;Decreased stance time - right;Decreased stride length;Decreased weight shift to right;Trunk flexed;Decreased trunk rotation  Ambulation Surface Level;Indoor  Dynamic Standing Balance  Dynamic Standing - Balance Activities Lateral lean/weight shifting;Forward lean/weight shifting;Reaching across midline;Turn head to look over shoulder;Reaching for objects  Prosthetics  Prosthetic Care Comments  Pt to increase wear time to 2 hours 2x a day. After sock ply managment education today pt reported he feels he can achieve this now.   Current prosthetic wear tolerance (days/week)  daily  Current prosthetic wear tolerance (#hours/day)  pt reports only wearing it for ~10  minutes at a time due to pain.   Residual limb condition  intact, no issues  Education Provided Skin check;Residual limb care;Correct ply sock adjustment;Proper Donning;Proper wear schedule/adjustment;Proper weight-bearing schedule/adjustment  Person(s) Educated Patient;Other (comment)  Education Method Explanation;Demonstration;Verbal cues  Education Method Verbalized understanding;Verbal cues required;Needs further instruction  Donning Prosthesis 5  Doffing Prosthesis 5           PT Education - 06/02/16 1626    Education provided Yes  Education Details Sock ply management/adjustment, sink HEP for balance and proprioception   Person(s) Educated Patient   Methods Explanation;Demonstration;Verbal cues;Tactile cues   Comprehension Verbalized understanding;Verbal cues required;Tactile cues required;Need further instruction          PT Short Term Goals - 05/24/16 1659    PT SHORT TERM GOAL #1   Title Pt will demonstrate proper donning & verbalize proper cleaning of prosthesis (Target Date: 06/22/2016)   Time 1   Period Months   Status New   PT SHORT TERM GOAL #2   Title Patient tolerates wear of prosthesis >8hrs total /day without skin issues or limb tenderness.  (Target Date: 06/22/2016)   Time 1   Period Months    Status New   PT SHORT TERM GOAL #3   Title Patient negotiates ramps, curbs & stairs (1 rail) with RW & prosthesis modified independent.  (Target Date: 06/22/2016)   Time 1   Period Months   Status New   PT SHORT TERM GOAL #4   Title Pt will be able to ambulate 300' with RW or crutches & prosthesis with verbal cues for deviations only, no balance losses noted.  (Target Date: 06/22/2016)   Time 1   Period Months   Status New   PT SHORT TERM GOAL #5   Title Pt will be able to reach 7" anterior and across midline and pick an object off the floor with supervision without UE support  (Target Date: 06/22/2016)   Time 1   Period Months   Status New           PT Long Term Goals - 05/24/16 1425    PT LONG TERM GOAL #1   Title Pt will be independent with prosthetic care and residual limb care to enable safe wear & use of prosthesis (Target Date: 07/22/2016)   Time 2   Period Months   Status New   PT LONG TERM GOAL #2   Title Pt will tolerate wear of prothesis >90% of all awake hours to enable increase function throughout his awake hours (Target Date: 07/22/2016)   Time 2   Period Months   Status New   PT LONG TERM GOAL #3   Title Pt will be able to negotiate ramps, curbs and stairs modified independent with LRAD & prosthesis to enable increased mobiltiy in the commnunity (Target Date: 07/22/2016)   Time 2   Period Months   Status New   PT LONG TERM GOAL #4   Title Pt will be able to ambulate 800' with LRAD & prosthesis including grass, unlevel surfaces to enable increased community mobility (Target Date: 07/22/2016)   Time 2   Period Months   Status New   PT LONG TERM GOAL #5   Title Pt will improve Berg Balance Score to > 36/56 to indicate a decreased risk of falls (Target Date: 07/22/2016)   Time 2   Period Months   Status New        06/02/16 1635  Plan  Clinical Impression Statement Pt was able to wear prosthesis with no reported pain after sock ply adjusted to correct  number needed to get proper fit. Pt and friend both education on when to adjust sock ply. Also educated pt on sink HEP today to work on balance and proprioception. Pt is making steady progress toward goals.   Pt will benefit from skilled therapeutic intervention in order to improve on the following deficits Decreased activity tolerance;Decreased balance;Decreased mobility;Decreased strength;Prosthetic Dependency;Decreased scar mobility;Decreased  range of motion;Impaired flexibility;Abnormal gait;Postural dysfunction  Rehab Potential Good  PT Treatment/Interventions Gait training;Functional mobility training;Prosthetic Training;Patient/family education;Balance training;ADLs/Self Care Home Management  PT Next Visit Plan Review prosthetic care, prosthetic gait including barriers       Patient will benefit from skilled therapeutic intervention in order to improve the following deficits and impairments:  Decreased activity tolerance, Decreased balance, Decreased mobility, Decreased strength, Prosthetic Dependency, Decreased scar mobility, Decreased range of motion, Impaired flexibility, Abnormal gait, Postural dysfunction  Visit Diagnosis: Other abnormalities of gait and mobility  Unsteadiness on feet  Other symptoms and signs involving the musculoskeletal system  Stiffness of right knee, not elsewhere classified  Muscle weakness (generalized)      Problem List Patient Active Problem List   Diagnosis Date Noted  . Toenail deformity 12/01/2015  . Healthcare maintenance 12/01/2015  . Below knee amputation status (HCC) 07/08/2015  . Paranoid schizophrenia, chronic condition (HCC) 06/26/2015  . Numbness and tingling-  Right Leg/ FOOT 06/10/2015  . Pain of right lower extremity 06/10/2015  . Malnutrition of moderate degree (HCC) 05/29/2015  . Femoral-tibial bypass graft occlusion, right (HCC) 05/27/2015  . PAD (peripheral artery disease) (HCC) 05/12/2015  . Type II diabetes mellitus with  complication, uncontrolled (HCC) 05/08/2015  . Abnormal nuclear stress test 05/06/2015  . CAD (coronary artery disease) of artery bypass graft 04/29/2015  . Hyperlipidemia 04/29/2015  . Essential hypertension 04/29/2015  . Tobacco abuse 04/29/2015  . Critical lower limb ischemia 03/31/2015  . Atherosclerosis of native arteries of extremity with rest pain [facility]) 03/30/2015    Myer Haff, SPTA  06/02/2016, 4:44 PM  [facility] [address] [place], [place], [address] Phone: [phone]   Fax:  [phone]  Name: FERRON ISHMAEL MRN: [id number] Date of Birth: Feb 13, 1961  This note has been reviewed and edited by supervising CI.  Sallyanne Kuster, PTA, [facility] [address], [address] [place], [place] [address] [phone] 06/04/2016, 3:48 PM

## 2016-06-02 NOTE — Patient Instructions (Signed)

## 2016-06-06 ENCOUNTER — Ambulatory Visit: Payer: Medicaid Other | Admitting: Physical Therapy

## 2016-06-06 ENCOUNTER — Encounter: Payer: Self-pay | Admitting: Physical Therapy

## 2016-06-06 DIAGNOSIS — R2689 Other abnormalities of gait and mobility: Secondary | ICD-10-CM

## 2016-06-06 DIAGNOSIS — M6281 Muscle weakness (generalized): Secondary | ICD-10-CM

## 2016-06-06 DIAGNOSIS — R2681 Unsteadiness on feet: Secondary | ICD-10-CM | POA: Diagnosis not present

## 2016-06-06 DIAGNOSIS — R29898 Other symptoms and signs involving the musculoskeletal system: Secondary | ICD-10-CM

## 2016-06-06 DIAGNOSIS — M25661 Stiffness of right knee, not elsewhere classified: Secondary | ICD-10-CM

## 2016-06-06 NOTE — Therapy (Signed)
[facility] [address] [place], [place], [address] Phone: [phone]   Fax:  [phone]  Physical Therapy Treatment  Patient Details  Name: Hector Green MRN: [id number] Date of Birth: 01/21/1961 Referring Provider: Aldean Baker, MD  Encounter Date: 06/06/2016      PT End of Session - 06/06/16 1150    Visit Number 3   Number of Visits 17   Date for PT Re-Evaluation 07/23/16   Authorization Type Medicare G codes needed   PT Start Time 1020   PT Stop Time 1102   PT Time Calculation (min) 42 min   Equipment Utilized During Treatment Gait belt   Activity Tolerance Patient tolerated treatment well   Behavior During Therapy [facility] for tasks assessed/performed      Past Medical History  Diagnosis Date  . CAD (coronary artery disease) 2011    Multivessel s/p CABG in [place]  . Type 2 diabetes mellitus (HCC)   . Hyperlipidemia   . Peripheral arterial disease (HCC)   . Abnormal nuclear stress test 05/06/2015  . Myocardial infarction (HCC) 2011  . Paranoid schizophrenia (HCC)   . Essential hypertension     not on medications at this time    Past Surgical History  Procedure Laterality Date  . Coronary artery bypass graft  2011    Three vessel by report  . Abdominal aortagram N/A 03/30/2015    Procedure: ABDOMINAL [name];  Surgeon: Fransisco Hertz, MD;  Location: [facility] CATH LAB;  Service: Cardiovascular;  Laterality: N/A;  . Lower extremity angiogram N/A 03/30/2015    Procedure: LOWER EXTREMITY ANGIOGRAM;  Surgeon: Fransisco Hertz, MD;  Location: [facility] CATH LAB;  Service: Cardiovascular;  Laterality: N/A;  . Cardiac catheterization N/A 05/06/2015    Procedure: Left Heart Cath and Cors/Grafts Angiography;  Surgeon: Tonny Bollman, MD;  Location: [facility] INVASIVE CV LAB;  Service: Cardiovascular;  Laterality: N/A;  . Femoral-tibial bypass graft Right 05/13/2015    Procedure: BYPASS GRAFT RIGHT FEMORAL-POSTERIOR TIBIAL ARTERY USING  LEFT NONREVERSED TRANSLOCATED GREATER SAPPHENOUS VEIN;  Surgeon: Pryor Ochoa, MD;  Location: [facility] OR;  Service: Vascular;  Laterality: Right;  . Vein harvest Left 05/13/2015    Procedure: LEFT GREATER SAPPHENOUS VEIN HARVEST;  Surgeon: Pryor Ochoa, MD;  Location: [facility] OR;  Service: Vascular;  Laterality: Left;  . Endarterectomy femoral Right 05/13/2015    Procedure: RIGHT COMMON FEMORAL, SUPERFICIAL FEMORAL AND PROFUNDA ENDARTERECTOMY ;  Surgeon: Pryor Ochoa, MD;  Location: [facility] OR;  Service: Vascular;  Laterality: Right;  . Patch angioplasty Right 05/13/2015    Procedure: RIGHT FEMORAL ARTERY PATCH ANGIOPLASTY;  Surgeon: Pryor Ochoa, MD;  Location: [facility] OR;  Service: Vascular;  Laterality: Right;  . Intraoperative arteriogram Right 05/13/2015    Procedure: RIGHT LOWER LEG INTRA OPERATIVE ARTERIOGRAM;  Surgeon: Pryor Ochoa, MD;  Location: [facility] OR;  Service: Vascular;  Laterality: Right;  . Thrombectomy femoral artery Right 05/13/2015    Procedure: THROMBECTOMY RIGHT FEMORAL-PROXIMAL POSTERIOR TIBAIL ARTERY BYPASS GRAFT;  Surgeon: Larina Earthly, MD;  Location: [facility] OR;  Service: Vascular;  Laterality: Right;  . Colonoscopy    . Femoral-tibial bypass graft Right 05/27/2015    Procedure: THROMBECTOMY OF RIGHT FEMORAL-POSTERIOR TIBIAL ARTERY SAPHENOUS VEIN BYPASS GRAFT ;  Surgeon: Pryor Ochoa, MD;  Location: [facility] OR;  Service: Vascular;  Laterality: Right;  . Intraoperative arteriogram Right 05/27/2015    Procedure: INTRA OPERATIVE ARTERIOGRAM;  Surgeon: Pryor Ochoa, MD;  Location: [facility] OR;  Service: Vascular;  Laterality: Right;  . 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 Lajoyce Corners  . Amputation Right 07/08/2015    Procedure: AMPUTATION BELOW RIGHT KNEE;  Surgeon: Nadara Mustard, MD;  Location: [facility] OR;  Service: Orthopedics;  Laterality: Right;  . Stump revision Right 08/28/2015    Procedure: Revision Right Below Knee Amputation;  Surgeon: Nadara Mustard, MD;  Location: [facility] OR;  Service: Orthopedics;   Laterality: Right;  . Stump revision Right 10/30/2015    Procedure: Revision Right Below Knee Amputation;  Surgeon: Nadara Mustard, MD;  Location: [facility] OR;  Service: Orthopedics;  Laterality: Right;    There were no vitals filed for this visit.      Subjective Assessment - 06/06/16 1024    Subjective Pt complains of prosthesis hurting around bottom of residual limb, therefore he has only been wearing it for up to 30 mintues at a time and taking it off due to pain. Reports no falls.   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 No/denies   Pain Onset In the past 7 days                         [facility] Adult PT Treatment/Exercise - 06/06/16 0001    Transfers   Transfers --   Sit to Stand --   Ambulation/Gait   Ambulation/Gait Yes   Ambulation/Gait Assistance 5: Supervision   Ambulation/Gait Assistance Details From lobby, pt hopped on LLE with RW as he was not wearing prosthesis.   once prosthesis was donned with 1ply sock pt was able to amb. Without pain.   Ambulation Distance (Feet) 250 Feet  +150' + 100'   Assistive device Rolling walker;Prosthesis   Gait Pattern Step-to pattern;Step-through pattern;Right foot flat;Right flexed knee in stance  progressed towards step through pattern with cues on how to advance RW and prosthesis.   Ambulation Surface Level   Ramp 5: Supervision  cues for sequence and Left prosthetic foot placement   Gait Comments progress towards step through pattern with cues on how to advance RW. Cues for upright posture visual scanning, and right initial heel strike   Knee/Hip Exercises: Aerobic   Other Aerobic Scitfit L=1  assisted in flushing fluid from residual limb to enable donning foam liner   Prosthetics   Prosthetic Care Comments  had  difficulty initally donning foam liner, used scifit to help push fluid out of residual limb so cup would fit over liner  instructed pt to work towards increasing wear time to 2hrs 2   Current prosthetic wear tolerance (days/week)  daily   Current prosthetic wear tolerance (#hours/day)  pt reports only wearing it for ~30  minutes at a time due to pain.    Current prosthetic weight-bearing tolerance (hours/day)  Pt tolerated 5 minutes of standing with partial weight on prosthesis with no complaints of pain or discomfort.    Residual limb condition  intact, no issues   Education Provided Skin check;Residual limb care;Correct ply sock adjustment;Proper Donning;Proper wear schedule/adjustment;Proper weight-bearing schedule/adjustment   Person(s) Educated Patient;Caregiver(s)   Education Method Explanation   Education Method Verbalized understanding   Donning Prosthesis Minimal assist   Doffing Prosthesis Minimal assist  PT Education - 06/06/16 1148    Education provided Yes          PT Short Term Goals - 05/24/16 1659    PT SHORT TERM GOAL #1   Title Pt will demonstrate proper donning & verbalize proper cleaning of prosthesis (Target Date: 06/22/2016)   Time 1   Period Months   Status New   PT SHORT TERM GOAL #2   Title Patient tolerates wear of prosthesis >8hrs total /day without skin issues or limb tenderness.  (Target Date: 06/22/2016)   Time 1   Period Months   Status New   PT SHORT TERM GOAL #3   Title Patient negotiates ramps, curbs & stairs (1 rail) with RW & prosthesis modified independent.  (Target Date: 06/22/2016)   Time 1   Period Months   Status New   PT SHORT TERM GOAL #4   Title Pt will be able to ambulate 300' with RW or crutches & prosthesis with verbal cues for deviations only, no balance losses noted.  (Target Date: 06/22/2016)   Time 1   Period Months   Status New   PT SHORT TERM GOAL #5   Title Pt will be able to reach 7" anterior and  across midline and pick an object off the floor with supervision without UE support  (Target Date: 06/22/2016)   Time 1   Period Months   Status New           PT Long Term Goals - 05/24/16 1425    PT LONG TERM GOAL #1   Title Pt will be independent with prosthetic care and residual limb care to enable safe wear & use of prosthesis (Target Date: 07/22/2016)   Time 2   Period Months   Status New   PT LONG TERM GOAL #2   Title Pt will tolerate wear of prothesis >90% of all awake hours to enable increase function throughout his awake hours (Target Date: 07/22/2016)   Time 2   Period Months   Status New   PT LONG TERM GOAL #3   Title Pt will be able to negotiate ramps, curbs and stairs modified independent with LRAD & prosthesis to enable increased mobiltiy in the commnunity (Target Date: 07/22/2016)   Time 2   Period Months   Status New   PT LONG TERM GOAL #4   Title Pt will be able to ambulate 800' with LRAD & prosthesis including grass, unlevel surfaces to enable increased community mobility (Target Date: 07/22/2016)   Time 2   Period Months   Status New   PT LONG TERM GOAL #5   Title Pt will improve Berg Balance Score to > 36/56 to indicate a decreased risk of falls (Target Date: 07/22/2016)   Time 2   Period Months   Status New               Plan - 06/06/16 1141    Clinical Impression Statement Pt has made small gradual progress with prosthetic wear from 10min to 30 min at a time reporting needing to take prosthesis off due to pain.  Plans to follow up with Dr. Lajoyce Cornersuda to discuss medication for muscle spasms.  After prothesis was donned correctly with correct number of ply sock pt had no pain and progressed gait training to include ramp negotiation.  Rehab Potential Good   PT Treatment/Interventions Gait training;Functional mobility training;Prosthetic Training;Patient/family education;Balance  training;ADLs/Self Care Home Management   PT Next Visit Plan Review prosthetic care, prosthetic gait including barriers      Patient will benefit from skilled therapeutic intervention in order to improve the following deficits and impairments:  Decreased activity tolerance, Decreased balance, Decreased mobility, Decreased strength, Prosthetic Dependency, Decreased scar mobility, Decreased range of motion, Impaired flexibility, Abnormal gait, Postural dysfunction  Visit Diagnosis: Other abnormalities of gait and mobility  Unsteadiness on feet  Other symptoms and signs involving the musculoskeletal system  Stiffness of right knee, not elsewhere classified  Muscle weakness (generalized)     Problem List Patient Active Problem List   Diagnosis Date Noted  . Toenail deformity 12/01/2015  . Healthcare maintenance 12/01/2015  . Below knee amputation status (HCC) 07/08/2015  . Paranoid schizophrenia, chronic condition (HCC) 06/26/2015  . Numbness and tingling-  Right Leg/ FOOT 06/10/2015  . Pain of right lower extremity 06/10/2015  . Malnutrition of moderate degree (HCC) 05/29/2015  . Femoral-tibial bypass graft occlusion, right (HCC) 05/27/2015  . PAD (peripheral artery disease) (HCC) 05/12/2015  . Type II diabetes mellitus with complication, uncontrolled (HCC) 05/08/2015  . Abnormal nuclear stress test 05/06/2015  . CAD (coronary artery disease) of artery bypass graft 04/29/2015  . Hyperlipidemia 04/29/2015  . Essential hypertension 04/29/2015  . Tobacco abuse 04/29/2015  . Critical lower limb ischemia 03/31/2015  . Atherosclerosis of native arteries of extremity with rest pain (HCC) 03/30/2015    Hortencia ConradiKarissa Jerlyn Pain, PTA  06/06/2016, 12:06 PM [facility] [address] [place], [place], [address] Phone: [phone]   Fax:  [phone]  Name: Hector Green MRN: [id number] Date of Birth: [birth year]

## 2016-06-08 ENCOUNTER — Ambulatory Visit: Payer: Medicaid Other | Admitting: Physical Therapy

## 2016-06-08 DIAGNOSIS — R2681 Unsteadiness on feet: Secondary | ICD-10-CM

## 2016-06-08 DIAGNOSIS — R29898 Other symptoms and signs involving the musculoskeletal system: Secondary | ICD-10-CM

## 2016-06-08 DIAGNOSIS — R2689 Other abnormalities of gait and mobility: Secondary | ICD-10-CM

## 2016-06-08 NOTE — Therapy (Signed)
[facility] [address] [place], [place], [address] Phone: [phone]   Fax:  [phone]  Physical Therapy Treatment  Patient Details  Name: Hector Green MRN: [id number] Date of Birth: October 03, 1961 Referring Provider: Aldean BakerMarcus Duda, MD  Encounter Date: 06/08/2016      PT End of Session - 06/08/16 1731    Visit Number 4   Number of Visits 17   Date for PT Re-Evaluation 07/23/16   Authorization Type Medicare G codes needed   PT Start Time 1016   PT Stop Time 1103   PT Time Calculation (min) 47 min   Equipment Utilized During Treatment Gait belt   Activity Tolerance Patient tolerated treatment well   Behavior During Therapy [facility] for tasks assessed/performed      Past Medical History  Diagnosis Date  . CAD (coronary artery disease) 2011    [place]  . Type 2 diabetes mellitus (HCC)   . Hyperlipidemia   . Peripheral arterial disease (HCC)   . Abnormal nuclear stress test 05/06/2015  . Myocardial infarction (HCC) 2011  . Paranoid schizophrenia (HCC)   . Essential hypertension     not on medications at this time    Past Surgical History  Procedure Laterality Date  . Coronary artery bypass graft  2011    Three vessel by report  . Abdominal aortagram N/A 03/30/2015    Procedure: ABDOMINAL Ronny FlurryAORTAGRAM;  Surgeon: Fransisco HertzBrian L Chen, MD;  [facility];  Service: Cardiovascular;  Laterality: N/A;  . Lower extremity angiogram N/A 03/30/2015    Procedure: LOWER EXTREMITY ANGIOGRAM;  Surgeon: Fransisco HertzBrian L Chen, MD;  [facility];  Service: Cardiovascular;  Laterality: N/A;  . Cardiac catheterization N/A 05/06/2015    Procedure: Left Heart Cath and Cors/Grafts Angiography;  Surgeon: Tonny BollmanMichael Cooper, MD;  [facility];  Service: Cardiovascular;  Laterality: N/A;  . Femoral-tibial bypass graft Right 05/13/2015    Procedure: BYPASS GRAFT RIGHT FEMORAL-POSTERIOR TIBIAL ARTERY USING  LEFT NONREVERSED TRANSLOCATED GREATER SAPPHENOUS VEIN;  Surgeon: Pryor OchoaJames D Lawson, MD;  [facility];  Service: Vascular;  Laterality: Right;  . Vein harvest Left 05/13/2015    Procedure: LEFT GREATER SAPPHENOUS VEIN HARVEST;  Surgeon: Pryor OchoaJames D Lawson, MD;  [facility];  Service: Vascular;  Laterality: Left;  . Endarterectomy femoral Right 05/13/2015    Procedure: RIGHT COMMON FEMORAL, SUPERFICIAL FEMORAL AND PROFUNDA ENDARTERECTOMY ;  Surgeon: Pryor OchoaJames D Lawson, MD;  [facility];  Service: Vascular;  Laterality: Right;  . Patch angioplasty Right 05/13/2015    Procedure: RIGHT FEMORAL ARTERY PATCH ANGIOPLASTY;  Surgeon: Pryor OchoaJames D Lawson, MD;  [facility];  Service: Vascular;  Laterality: Right;  . Intraoperative arteriogram Right 05/13/2015    Procedure: RIGHT LOWER LEG INTRA OPERATIVE ARTERIOGRAM;  Surgeon: Pryor OchoaJames D Lawson, MD;  [facility];  Service: Vascular;  Laterality: Right;  . Thrombectomy femoral artery Right 05/13/2015    Procedure: THROMBECTOMY RIGHT FEMORAL-PROXIMAL POSTERIOR TIBAIL ARTERY BYPASS GRAFT;  Surgeon: Larina Earthlyodd F Early, MD;  [facility];  Service: Vascular;  Laterality: Right;  . Colonoscopy    . Femoral-tibial bypass graft Right 05/27/2015    Procedure: THROMBECTOMY OF RIGHT FEMORAL-POSTERIOR TIBIAL ARTERY SAPHENOUS VEIN BYPASS GRAFT ;  Surgeon: Pryor OchoaJames D Lawson, MD;  [facility];  Service: Vascular;  Laterality: Right;  . Intraoperative arteriogram Right 05/27/2015    Procedure: INTRA OPERATIVE ARTERIOGRAM;  Surgeon: Pryor OchoaJames D Lawson, MD;  [facility];  Service: Vascular;  Laterality: Right;  . 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 Lajoyce Cornersuda  . Amputation Right 07/08/2015    Procedure: AMPUTATION BELOW RIGHT KNEE;  Surgeon: Nadara MustardMarcus Duda V, MD;  [facility]: [facility] OR;  Service: Orthopedics;  Laterality: Right;  . Stump revision Right 08/28/2015    Procedure: Revision Right Below Knee Amputation;  Surgeon: Nadara MustardMarcus Duda V, MD;  [facility];  Service: Orthopedics;   Laterality: Right;  . Stump revision Right 10/30/2015    Procedure: Revision Right Below Knee Amputation;  Surgeon: Nadara MustardMarcus Duda V, MD;  [facility];  Service: Orthopedics;  Laterality: Right;    There were no vitals filed for this visit.      Subjective Assessment - 06/08/16 1019    Subjective Pt pain level has decreased wtih improved sock managment- went from 3 ply to 1 ply. 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 Yes   Pain Score 7    Pain Location Leg  sides of distal tibia   Pain Orientation Right   Pain Descriptors / Indicators Pressure   Pain Type Acute pain   Pain Onset In the past 7 days   Pain Frequency Intermittent   Aggravating Factors  walking on prosthesis   Pain Relieving Factors massage of distal limb             Prosthetics Assessment - 06/08/16 0001    Prosthetics   Current prosthetic wear tolerance (days/week)  daily                    [facility] Adult PT Treatment/Exercise - 06/08/16 1015    Ambulation/Gait   Ambulation/Gait Yes   Ambulation/Gait Assistance 5: Supervision   Ambulation/Gait Assistance Details Verbal and visual cues for increasing step width and step length to improve gait mechanics and balacne during ambualation    Ambulation Distance (Feet) 290 Feet  x1, 80x1    Assistive device Rolling walker;Prosthesis   Gait Pattern Step-to pattern;Step-through pattern;Right foot flat;Right flexed knee in stance  progress towards step through pattern with cues on how to ad   Ambulation Surface Level;Indoor   Stairs Yes   Stairs Assistance 5: Supervision   Stairs Assistance Details (indicate cue type and reason) PT instruction in sequencing with 2 rails, then with L rail only to simulate home  environment. During first ascending with 2 rails the patient prosthesis fell off and therapist and pt unable to properly lock prosthesis. Therapist called prosthetists office to lengthening pin on liners    Ramp 5: Supervision  cues for sequence and Left prosthetic foot placement   Ramp Details (indicate cue type and reason) Verbal cuing for decreasing step length and RW walker management to increase safety   Curb 5: Supervision   Curb Details (indicate cue type and reason) PT instruction and verbal cuing for step through after ascend and descending to increase base of support for increased balance   Gait Comments --   Knee/Hip Exercises: Aerobic   Other Aerobic Scitfit L=1 4min  assisted in flushing fluid from residual limb to enable donn   Prosthetics   Prosthetic Care Comments  had difficulty initally donning foam liner, used scifit to help push fluid out of residual  limb so cup would fit over liner  instructed pt to work towards increasing wear time to 2hrs 2   Current prosthetic wear tolerance (days/week)  daily   Current prosthetic wear tolerance (#hours/day)  pt reports only wearing it for ~30  minutes at a time due to pain.    Current prosthetic weight-bearing tolerance (hours/day)  Pt tolerated 5 minutes of standing with partial weight on prosthesis with no complaints of pain or discomfort.    Residual limb condition  intact, no issues   Education Provided Skin check;Residual limb care;Correct ply sock adjustment;Proper Donning;Proper wear schedule/adjustment;Proper weight-bearing schedule/adjustment                PT Education - 06/08/16 1101    Education provided Yes   Education Details Increasing step length and step width outside of therapy    Person(s) Educated Patient   Methods Explanation;Demonstration;Tactile cues;Verbal cues   Comprehension Verbalized understanding;Returned demonstration;Verbal cues required;Tactile cues required;Need further instruction           PT Short Term Goals - 05/24/16 1659    PT SHORT TERM GOAL #1   Title Pt will demonstrate proper donning & verbalize proper cleaning of prosthesis (Target Date: 06/22/2016)   Time 1   Period Months   Status New   PT SHORT TERM GOAL #2   Title Patient tolerates wear of prosthesis >8hrs total /day without skin issues or limb tenderness.  (Target Date: 06/22/2016)   Time 1   Period Months   Status New   PT SHORT TERM GOAL #3   Title Patient negotiates ramps, curbs & stairs (1 rail) with RW & prosthesis modified independent.  (Target Date: 06/22/2016)   Time 1   Period Months   Status New   PT SHORT TERM GOAL #4   Title Pt will be able to ambulate 300' with RW or crutches & prosthesis with verbal cues for deviations only, no balance losses noted.  (Target Date: 06/22/2016)   Time 1   Period Months   Status New   PT SHORT TERM GOAL #5   Title Pt will be able to reach 7" anterior and across midline and pick an object off the floor with supervision without UE support  (Target Date: 06/22/2016)   Time 1   Period Months   Status New           PT Long Term Goals - 05/24/16 1425    PT LONG TERM GOAL #1   Title Pt will be independent with prosthetic care and residual limb care to enable safe wear & use of prosthesis (Target Date: 07/22/2016)   Time 2   Period Months   Status New   PT LONG TERM GOAL #2   Title Pt will tolerate wear of prothesis >90% of all awake hours to enable increase function throughout his awake hours (Target Date: 07/22/2016)   Time 2   Period Months   Status New   PT LONG TERM GOAL #3   Title Pt will be able to negotiate ramps, curbs and stairs modified independent with LRAD & prosthesis to enable increased mobiltiy in the commnunity (Target Date: 07/22/2016)   Time 2   Period Months   Status New   PT LONG TERM GOAL #4   Title Pt will be able to ambulate 800' with LRAD & prosthesis including grass, unlevel surfaces to enable increased community mobility  (Target Date: 07/22/2016)   Time 2   Period Months   Status New  PT LONG TERM GOAL #5   Title Pt will improve Berg Balance Score to > 36/56 to indicate a decreased risk of falls (Target Date: 07/22/2016)   Time 2   Period Months   Status New               Plan - 06/08/16 1732    Clinical Impression Statement This session was limited due to inability for prosthesis to properly lock, PT called orthotists and patient to get longer pins after todays therpay session. Pt is increasing tolerance for prosthetic wear time and abiltiy to perform fucntional activites such as ramps, curbs and stairs with minimal pain. Pain did occur during the session after up on feet >15 minutes   Rehab Potential Good   PT Treatment/Interventions Gait training;Functional mobility training;Prosthetic Training;Patient/family education;Balance training;ADLs/Self Care Home Management   PT Next Visit Plan Review prosthetic care, prosthetic gait including barriers      Patient will benefit from skilled therapeutic intervention in order to improve the following deficits and impairments:  Decreased activity tolerance, Decreased balance, Decreased mobility, Decreased strength, Prosthetic Dependency, Decreased scar mobility, Decreased range of motion, Impaired flexibility, Abnormal gait, Postural dysfunction  Visit Diagnosis: Other abnormalities of gait and mobility  Unsteadiness on feet  Other symptoms and signs involving the musculoskeletal system     Problem List Patient Active Problem List   Diagnosis Date Noted  . Toenail deformity 12/01/2015  . Healthcare maintenance 12/01/2015  . Below knee amputation status (HCC) 07/08/2015  . Paranoid schizophrenia, chronic condition (HCC) 06/26/2015  . Numbness and tingling-  Right Leg/ FOOT 06/10/2015  . Pain of right lower extremity 06/10/2015  . Malnutrition of moderate degree (HCC) 05/29/2015  . Femoral-tibial bypass graft occlusion, right (HCC) 05/27/2015   . PAD (peripheral artery disease) (HCC) 05/12/2015  . Type II diabetes mellitus with complication, uncontrolled (HCC) 05/08/2015  . Abnormal nuclear stress test 05/06/2015  . CAD (coronary artery disease) of artery bypass graft 04/29/2015  . Hyperlipidemia 04/29/2015  . Essential hypertension 04/29/2015  . Tobacco abuse 04/29/2015  . Critical lower limb ischemia 03/31/2015  . Atherosclerosis of native arteries of extremity with rest pain [facility]) 03/30/2015    Rollene Fare, SPT 06/08/2016, 5:35 PM  [facility] [address] [place], [place], [address] Phone: [phone]   Fax:  [phone]  Name: Hector Green MRN: [id number] Date of Birth: 08/02/1961

## 2016-06-13 ENCOUNTER — Encounter: Payer: Medicare (Managed Care) | Admitting: Physical Therapy

## 2016-06-13 DIAGNOSIS — I10 Essential (primary) hypertension: Secondary | ICD-10-CM | POA: Diagnosis not present

## 2016-06-13 DIAGNOSIS — G894 Chronic pain syndrome: Secondary | ICD-10-CM | POA: Diagnosis not present

## 2016-06-13 DIAGNOSIS — D689 Coagulation defect, unspecified: Secondary | ICD-10-CM | POA: Diagnosis not present

## 2016-06-13 DIAGNOSIS — I739 Peripheral vascular disease, unspecified: Secondary | ICD-10-CM | POA: Diagnosis not present

## 2016-06-13 DIAGNOSIS — E1149 Type 2 diabetes mellitus with other diabetic neurological complication: Secondary | ICD-10-CM | POA: Diagnosis not present

## 2016-06-13 DIAGNOSIS — G546 Phantom limb syndrome with pain: Secondary | ICD-10-CM | POA: Diagnosis not present

## 2016-06-13 DIAGNOSIS — E1159 Type 2 diabetes mellitus with other circulatory complications: Secondary | ICD-10-CM | POA: Diagnosis not present

## 2016-06-16 ENCOUNTER — Ambulatory Visit: Payer: Medicaid Other | Attending: Orthopedic Surgery | Admitting: Physical Therapy

## 2016-06-20 ENCOUNTER — Ambulatory Visit: Payer: Medicaid Other | Admitting: Physical Therapy

## 2016-06-22 ENCOUNTER — Telehealth: Payer: Self-pay | Admitting: Pharmacist

## 2016-06-22 NOTE — Telephone Encounter (Signed)
Patient is scheduled to have 16 teeth extracted by Dr Barbette MerinoJensen.  Their office needs recommendations on holding anticoagualtion therapy.  Per last office visit with Dr Ermalinda MemosBradshaw 04/15/16, Hector Green has [facility] because he has moved.  Notified Samantha with Dr Randa EvensJensen's office that since patient has not have INR here since 04/15/16 that they need to contact his current provider for recommendations.

## 2016-06-23 ENCOUNTER — Ambulatory Visit: Payer: Medicaid Other | Admitting: Physical Therapy

## 2016-06-27 ENCOUNTER — Telehealth: Payer: Self-pay | Admitting: Pharmacist

## 2016-06-27 NOTE — Telephone Encounter (Signed)
Patient is scheduled for dental procedure 07/22/2016 which will require holding warfarin.  He will have multiple teeth extracted.  Received call from Hector Green's office for us to make recommendations for holding warfarin.  I would be glad to do so but [name] stated at last appt with Hector Green that he had moved and would be finding physician closer to where he now lives.  He has not have INR here since May 2017 and at that time it was supratherapeutic.  Called patient ant left message that he would need appt for INR and to discuss holding warfarin and if bridging with lovenox would be needed.  If he has already seen new PCP then he should discuss with them.

## 2016-06-28 ENCOUNTER — Ambulatory Visit: Payer: Medicaid Other | Admitting: Physical Therapy

## 2016-06-28 NOTE — Telephone Encounter (Signed)
Spoke with patient -  He has already established with [facility].  He has follow up appt with them 07/11/2016.  Sharyn BlitzAdvised Samantha / Sam with Dr Chipper HerbJenson's office and patient to consult with Dr Bryna ColanderKikel for advise for holding anticoagulation prior to this dental surgery.  Samantha at Dr Chipper HerbJenson's office notified.

## 2016-07-01 ENCOUNTER — Encounter: Payer: Medicare (Managed Care) | Admitting: Physical Therapy

## 2016-07-05 ENCOUNTER — Encounter: Payer: Medicare (Managed Care) | Admitting: Physical Therapy

## 2016-07-08 ENCOUNTER — Encounter: Payer: Medicare (Managed Care) | Admitting: Physical Therapy

## 2016-07-11 DIAGNOSIS — E1159 Type 2 diabetes mellitus with other circulatory complications: Secondary | ICD-10-CM | POA: Diagnosis not present

## 2016-07-11 DIAGNOSIS — G546 Phantom limb syndrome with pain: Secondary | ICD-10-CM | POA: Diagnosis not present

## 2016-07-11 DIAGNOSIS — I10 Essential (primary) hypertension: Secondary | ICD-10-CM | POA: Diagnosis not present

## 2016-07-11 DIAGNOSIS — G894 Chronic pain syndrome: Secondary | ICD-10-CM | POA: Diagnosis not present

## 2016-07-11 DIAGNOSIS — L409 Psoriasis, unspecified: Secondary | ICD-10-CM | POA: Diagnosis not present

## 2016-07-12 ENCOUNTER — Encounter: Payer: Medicare (Managed Care) | Admitting: Physical Therapy

## 2016-07-13 ENCOUNTER — Telehealth (HOSPITAL_COMMUNITY): Payer: Self-pay

## 2016-07-13 ENCOUNTER — Ambulatory Visit (HOSPITAL_COMMUNITY): Payer: Medicare (Managed Care) | Attending: Orthopedic Surgery

## 2016-07-13 NOTE — Telephone Encounter (Signed)
No show, called and left message informing pt about missed apt and informed pt of no further apt. schedulded.  Included contact information.  [address] LPTA; CBIS [phone]

## 2016-07-13 NOTE — Pre-Procedure Instructions (Signed)
Hector Green  07/13/2016      [id number] - [place], [place] - [address] [place] [address] Phone: [phone] Fax: [phone]  [facility] - [place], [place] - [address] [place] [address] Phone: [phone] Fax: [phone]    Your procedure is scheduled on   Friday  07/22/16  Report to [facility] Admitting at 6:45 A.M.  Call this number if you have problems the morning of surgery:  [phone]   Remember:   Do not eat food or drink liquids after midnight on Aug.10 (Thursday)   Take these medicines the morning of surgery with A SIP OF WATER: gabapentin (neurontin), oxycodone if needed              Stop NSAIDS: advil, motrin, ibuprofen, aleve,  bc powders, goody's on Aug. 4.              Stop aspirin  per Dr. Teola Bradley    How to Manage Your Diabetes Before and After Surgery  Why is it important to control my blood sugar before and after surgery? . Improving blood sugar levels before and after surgery helps healing and can limit problems. . A way of improving blood sugar control is eating a healthy diet by: o  Eating less sugar and carbohydrates o  Increasing activity/exercise o  Talking with your doctor about reaching your blood sugar goals . High blood sugars (greater than 180 mg/dL) can raise your risk of infections and slow your recovery, so you will need to focus on controlling your diabetes during the weeks before surgery. . Make sure that the doctor who takes care of your diabetes knows about your planned surgery including the date and location.  How do I manage my blood sugar before surgery? . Check your blood sugar at least 4 times a day, starting 2 days before surgery, to make sure that the level is not too high or low. o Check your blood sugar the morning of your surgery when you wake up and every 2 hours until you get to the Short Stay unit. . If  your blood sugar is less than 70 mg/dL, you will need to treat for low blood sugar: o Do not take insulin. o Treat a low blood sugar (less than 70 mg/dL) with  cup of clear juice (cranberry or apple), 4 glucose tablets, OR glucose gel. o Recheck blood sugar in 15 minutes after treatment (to make sure it is greater than 70 mg/dL). If your blood sugar is not greater than 70 mg/dL on recheck, call [phone] for further instructions. . Report your blood sugar to the short stay nurse when you get to Short Stay.  . If you are admitted to the hospital after surgery: o Your blood sugar will be checked by the staff and you will probably be given insulin after surgery (instead of oral diabetes medicines) to make sure you have good blood sugar levels. o The goal for blood sugar control after surgery is 80-180 mg/dL.              WHAT DO I DO ABOUT MY DIABETES MEDICATION?   Marland Kitchen Do not take oral diabetes medicines (pills) the morning of surgery.   Reviewed and Endorsed by [facility] Patient Education Committee, August 2015   Do not wear jewelry, make-up or nail polish.  Do  not wear lotions, powders, or perfumes.  You may wear deoderant.  Do not shave 48 hours prior to surgery.  Men may shave face and neck.  Do not bring valuables to the hospital.  [facility] is not responsible for any belongings or valuables.  Contacts, dentures or bridgework may not be worn into surgery.  Leave your suitcase in the car.  After surgery it may be brought to your room.  For patients admitted to the hospital, discharge time will be determined by your treatment team.  Patients discharged the day of surgery will not be allowed to drive home.   Name and phone number of your driver:    Special instructions:  Hector Green - Preparing for Surgery  Before surgery, you can play an important role.  Because skin is not sterile, your skin needs to be as free of germs as possible.  You can reduce the number of  germs on you skin by washing with CHG (chlorahexidine gluconate) soap before surgery.  CHG is an antiseptic cleaner which kills germs and bonds with the skin to continue killing germs even after washing.  Please DO NOT use if you have an allergy to CHG or antibacterial soaps.  If your skin becomes reddened/irritated stop using the CHG and inform your nurse when you arrive at Short Stay.  Do not shave (including legs and underarms) for at least 48 hours prior to the first CHG shower.  You may shave your face.  Please follow these instructions carefully:   1.  Shower with CHG Soap the night before surgery and the                                morning of Surgery.  2.  If you choose to wash your hair, wash your hair first as usual with your       normal shampoo.  3.  After you shampoo, rinse your hair and body thoroughly to remove the                      Shampoo.  4.  Use CHG as you would any other liquid soap.  You can apply chg directly       to the skin and wash gently with scrungie or a clean washcloth.  5.  Apply the CHG Soap to your body ONLY FROM THE NECK DOWN.        Do not use on open wounds or open sores.  Avoid contact with your eyes,       ears, mouth and genitals (private parts).  Wash genitals (private parts)       with your normal soap.  6.  Wash thoroughly, paying special attention to the area where your surgery        will be performed.  7.  Thoroughly rinse your body with warm water from the neck down.  8.  DO NOT shower/wash with your normal soap after using and rinsing off       the CHG Soap.  9.  Pat yourself dry with a clean towel.            10.  Wear clean pajamas.            11.  Place clean sheets on your bed the night of your first shower and do not        sleep with pets.  Day of Surgery  Do not apply any lotions/deoderants the morning of surgery.  Please wear clean clothes to the hospital/surgery center.    Please read over the following fact sheets that you were  given in pre-admission.

## 2016-07-13 NOTE — Progress Notes (Signed)
SPOKE WITH SAMANTHA AT DR. Randa Evens OFFICE RE:  NEEDING PREOP ORDERS.  SAMANTHA STATED SHE WAS TRYING TO GET INSTRUCTION [id number] THINNER FROM PATIENTS MEDICAL PHYSICIAN.

## 2016-07-14 ENCOUNTER — Encounter (HOSPITAL_COMMUNITY): Payer: Self-pay

## 2016-07-14 ENCOUNTER — Encounter: Payer: Medicare (Managed Care) | Admitting: Physical Therapy

## 2016-07-14 ENCOUNTER — Encounter (HOSPITAL_COMMUNITY)
Admission: RE | Admit: 2016-07-14 | Discharge: 2016-07-14 | Disposition: A | Discharge status: Home / Self Care | Payer: Medicare Other | Source: Ambulatory Visit | Attending: Oral Surgery | Admitting: Oral Surgery

## 2016-07-14 ENCOUNTER — Encounter (HOSPITAL_COMMUNITY): Payer: Self-pay | Admitting: Vascular Surgery

## 2016-07-14 DIAGNOSIS — I2510 Atherosclerotic heart disease of native coronary artery without angina pectoris: Secondary | ICD-10-CM | POA: Insufficient documentation

## 2016-07-14 DIAGNOSIS — E785 Hyperlipidemia, unspecified: Secondary | ICD-10-CM | POA: Insufficient documentation

## 2016-07-14 DIAGNOSIS — F200 Paranoid schizophrenia: Secondary | ICD-10-CM | POA: Diagnosis not present

## 2016-07-14 DIAGNOSIS — Z951 Presence of aortocoronary bypass graft: Secondary | ICD-10-CM | POA: Insufficient documentation

## 2016-07-14 DIAGNOSIS — Z7984 Long term (current) use of oral hypoglycemic drugs: Secondary | ICD-10-CM | POA: Diagnosis not present

## 2016-07-14 DIAGNOSIS — Z7982 Long term (current) use of aspirin: Secondary | ICD-10-CM | POA: Diagnosis not present

## 2016-07-14 DIAGNOSIS — Z01812 Encounter for preprocedural laboratory examination: Secondary | ICD-10-CM | POA: Insufficient documentation

## 2016-07-14 DIAGNOSIS — I252 Old myocardial infarction: Secondary | ICD-10-CM | POA: Diagnosis not present

## 2016-07-14 DIAGNOSIS — Z95828 Presence of other vascular implants and grafts: Secondary | ICD-10-CM | POA: Diagnosis not present

## 2016-07-14 DIAGNOSIS — E119 Type 2 diabetes mellitus without complications: Secondary | ICD-10-CM | POA: Diagnosis not present

## 2016-07-14 DIAGNOSIS — M270 Developmental disorders of jaws: Secondary | ICD-10-CM | POA: Diagnosis not present

## 2016-07-14 DIAGNOSIS — K029 Dental caries, unspecified: Secondary | ICD-10-CM | POA: Insufficient documentation

## 2016-07-14 DIAGNOSIS — Z01818 Encounter for other preprocedural examination: Secondary | ICD-10-CM | POA: Diagnosis not present

## 2016-07-14 DIAGNOSIS — K0530 Chronic periodontitis, unspecified: Secondary | ICD-10-CM | POA: Diagnosis not present

## 2016-07-14 DIAGNOSIS — Z89511 Acquired absence of right leg below knee: Secondary | ICD-10-CM | POA: Insufficient documentation

## 2016-07-14 DIAGNOSIS — R9431 Abnormal electrocardiogram [ECG] [EKG]: Secondary | ICD-10-CM | POA: Diagnosis not present

## 2016-07-14 DIAGNOSIS — Z79899 Other long term (current) drug therapy: Secondary | ICD-10-CM | POA: Diagnosis not present

## 2016-07-14 DIAGNOSIS — I10 Essential (primary) hypertension: Secondary | ICD-10-CM | POA: Insufficient documentation

## 2016-07-14 LAB — BASIC METABOLIC PANEL
Anion gap: 10 (ref 5–15)
BUN: 21 mg/dL — AB (ref 6–20)
CHLORIDE: 105 mmol/L (ref 101–111)
CO2: 21 mmol/L — AB (ref 22–32)
CREATININE: 0.83 mg/dL (ref 0.61–1.24)
Calcium: 9.6 mg/dL (ref 8.9–10.3)
GFR calc Af Amer: >60 mL/min (ref >60)
GFR calc non Af Amer: >60 mL/min (ref >60)
GLUCOSE: 97 mg/dL (ref 65–99)
Potassium: 4.2 mmol/L (ref 3.5–5.1)
SODIUM: 136 mmol/L (ref 135–145)

## 2016-07-14 LAB — CBC
HCT: 41.5 % (ref 39.0–52.0)
Hemoglobin: 14 g/dL (ref 13.0–17.0)
MCH: 29.4 pg (ref 26.0–34.0)
MCHC: 33.7 g/dL (ref 30.0–36.0)
MCV: 87 fL (ref 78.0–100.0)
PLATELETS: 153 10*3/uL (ref 150–400)
RBC: 4.77 MIL/uL (ref 4.22–5.81)
RDW: 13.4 % (ref 11.5–15.5)
WBC: 9.5 10*3/uL (ref 4.0–10.5)

## 2016-07-14 LAB — GLUCOSE, CAPILLARY: Glucose-Capillary: 114 mg/dL — ABNORMAL HIGH (ref 65–99)

## 2016-07-14 NOTE — Progress Notes (Signed)
PCP: Dr. Jonathon Bellows [facility] in [place].  No cardiologist.  Fasting blood sugars 122  Pt. Reported he is not taking coumadin anymore. Stated prescription ran out 2 months ago and he didn't know which doctor prescribed it.  Notified Samatha at Dr. Randa Evens  Office. She said  Dr. Bernette Mayers office stated to stop coumadin 5 days prior to surgery. I informed her he wasn't taking it. She will follow up with Dr. Bryna Colander and get back to the pt. Also asked when he should stop aspirin? She will get this information to pt.

## 2016-07-14 NOTE — Progress Notes (Signed)
Anesthesia PAT Evaluation: Patient is a 55 year old male scheduled for dental restoration on 07/22/16 by Dr. Barbette Merino.   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  - PCP is Dr. Bubb Robert at [facility]. Previously seen by Dr. Kevin Fenton at [facility], but was discharged due to multiple no-shows. Prior to 2016, patient lived in [place], and moved to [place] to be closer to his son. - He has a pending appointment with endocrinologist Dr. Fransico Him on 07/20/16, as a new patient evaluation. - Cardiologist is Dr. Nona Dell, last seen 04/2015.   Medications include ASA 81 mg, gabapentin, glimepiride, metformin, lisinopril, Percocet. Warfarin is listed, but reported he has not been taking for the past few months (did not have another refill). He did not know why he was taking it in the first place. In review of his chart, he was started on warfarin ~ 05/27/15 (by vascular surgeon Dr. Hart Rochester) for what appears to be FPBG graft patency but ultimately underwent right BKA. (Dr. Lajoyce Corners did patient's BKA and subsequent revisions and just continued patient on warfarin. On 04/15/16, patient's previous PCP Dr. Ermalinda Memos had recommended patient clarify to vascular surgeon if he needed to continue, but patient has not done this yet.) Per Sam at Dr. Randa Evens office, patient's current PCP Dr. Bryna Colander instructed to hold warfarin five days prior to surgery.   PAT Vitals: BP 133/71, HR 80, RR 20, T 36.7C, O2 sat 98%. CBG 114. BMI 26. On exam, patient is in a wheelchair. Has BKA. LLE with dry gauze dressing (reportedly to cover "eczema"; denied drainage). Heart RRR, no murmur noted. Lungs clear. He denied CP, SOB, syncope, palpitations, or significant edema. Denied CHF hospitalizations. He is not currently getting PT  (trying to find one closer to home), but does have his prosthesis. He is primarily sitting/lying most of the day, but puts his prothesis on for 1 hour twice a day and does exercises and walks around with walker support.  EKG 07/14/2016: NSR, inferior infarct (age undetermined). Possible LAE, incomplete RRR and PVC's resolved when compared to 05/06/15 tracing.   Cardiac cath 05/06/2015 (for high risk stress test with large regions of scar in the inferior and inferolateral walls, large area of ischemia in the anterolateral wall, LVEF 31% [30-44%], and TID ratio of 1.22): 1. Severe native three-vessel coronary artery disease with total occlusion of the proximal RCA, total occlusion of the proximal left circumflex, and total occlusion of the LAD after the first septal perforator 2. Status post aortocoronary bypass surgery with continued patency of the LIMA to LAD, saphenous vein graft to intermediate, and 7 his vein graft to right PDA 3. Total occlusion of the AV groove circumflex collateralized from the saphenous vein graft to intermediate 4. Moderately severe segmental left ventricular systolic dysfunction with LVEF estimated at 35% --Discussion: The patient has continued patency of his bypass grafts. He may have ischemia in the distribution of the second and third obtuse marginal branches which are collateralized. Otherwise, he has what appears to be normal perfusion to the LAD, diagonal/intermediate, and RCA territories based on patency of all bypass grafts. I suspect he will be able to proceed with surgery and there does not appear to be any indication for further coronary revascularization.  Preoperative labs noted. Cr 0.83. CBC WNL. Glucose 97. A1c is pending. Reports  fasting CBG ~ 122. (A1c 8.6 09/28/15.)   Patient with known CAD, possible ischemic cardiomyopathy (EF 35% 2016). He did tolerate several surgeries last year, but has not been evaluated by cardiology in over a year. At some point, he likely  needs an echo to re-evaluate EF. He did not exhibit any signs/symptoms of CHF today. He continues to smoke. Discussed with anesthesiologist Dr. Mable Fill. Recommend cardiology pre-operative evaluation. Would defer timing of echo to cardiology. I also advised patient to follow-up with [facility] (Dr. Hart Rochester or Dr. Imogene Burn) regarding whether or not he needs long term warfarin since he is now s/p BKA.   Patient is going to see Jacolyn Reedy, PA-C on 07/20/16 at 1100AM. Dr. Randa Evens office notified. Will leave chart for follow-up endocrinology and cardiology visits.  Velna Ochs [facility] Short Stay Center/Anesthesiology Phone [phone] 07/14/2016 4:32 PM

## 2016-07-15 LAB — HEMOGLOBIN A1C
Hgb A1c MFr Bld: 8.8 % — ABNORMAL HIGH (ref 4.8–5.6)
Mean Plasma Glucose: 206 mg/dL

## 2016-07-19 ENCOUNTER — Encounter: Payer: Medicare (Managed Care) | Admitting: Physical Therapy

## 2016-07-20 ENCOUNTER — Ambulatory Visit: Payer: Medicare Other | Admitting: "Endocrinology

## 2016-07-20 ENCOUNTER — Encounter: Payer: Self-pay | Admitting: Physician Assistant

## 2016-07-20 ENCOUNTER — Ambulatory Visit (INDEPENDENT_AMBULATORY_CARE_PROVIDER_SITE_OTHER): Payer: Medicare Other | Admitting: Physician Assistant

## 2016-07-20 VITALS — BP 138/64 | HR 84 | Ht 70.0 in | Wt 188.0 lb

## 2016-07-20 DIAGNOSIS — I25810 Atherosclerosis of coronary artery bypass graft(s) without angina pectoris: Secondary | ICD-10-CM

## 2016-07-20 DIAGNOSIS — R0989 Other specified symptoms and signs involving the circulatory and respiratory systems: Secondary | ICD-10-CM

## 2016-07-20 DIAGNOSIS — I10 Essential (primary) hypertension: Secondary | ICD-10-CM | POA: Diagnosis not present

## 2016-07-20 DIAGNOSIS — Z01818 Encounter for other preprocedural examination: Secondary | ICD-10-CM | POA: Diagnosis not present

## 2016-07-20 DIAGNOSIS — I739 Peripheral vascular disease, unspecified: Secondary | ICD-10-CM

## 2016-07-20 NOTE — Progress Notes (Signed)
Cardiology Office Note    Date:  07/20/2016   ID:  Hector Green, DOB 03/08/1961, MRN [id number]  PCP:  [facility]  Cardiologist: Dr. Diona Browner  Chief Complaint  Patient presents with  . Follow-up    History of Present Illness:  Hector Green is a 55 y.o. male with history of MI and CABG 3 2011 in [place], [place]. He had a stress Myoview 04/27/15 that showed a large area of ischemia in the anterolateral wall EF 30-44%. He was having right leg and foot pain and need to have vascular surgery for critical limb ischemia. Cardiac catheterization was performed 05/06/15 that showed continued patency of his bypass grafts. He may have ischemia in the distribution of the second and third obtuse marginal branches which are collateralized. Otherwise he had what appeared to be normal perfusion of the LAD, diagonal/intermediate and RCA territories based on patency of all bypass grafts. Patient ultimately had right common femoral endarterectomy and femoral to posterior tibial bypass using saphenous vein from left leg on 05/13/15. Bypass occluded several hours later and he underwent thrombectomy of right femoral posterior tib saphenous vein graft. He was placed on Coumadin but signed out AMA and never went to the Coumadin clinic after discharge. He ultimately underwent right below the knee amputation 06/2015 with dehiscence and revision in 10/2015.   Patient also has history of hypertension, hyperlipidemia and tobacco abuse.  He has not been seen in our office since his cardiac catheterization.  Patient comes in today for preoperative clearance to undergo dental extractions 07/22/16. He walks with a prosthetic leg about 2 hours a day. Otherwise he has to use his walker. He is very active walking to take his garbage out taking care of his household chores and walking throughout the day. He denies any chest pain, tightness, pressure, dyspnea, dyspnea on exertion, palpitations,  dizziness or presyncope. He continues to smoke 4 cigarettes a day but is trying to quit. He does take Coumadin.     Past Medical History:  Diagnosis Date  . Abnormal nuclear stress test 05/06/2015  . CAD (coronary artery disease) 2011   Multivessel s/p CABG in [place]  . Essential hypertension    not on medications at this time  . Hyperlipidemia   . Hypertension   . Myocardial infarction (HCC) 2011  . Paranoid schizophrenia (HCC)   . Peripheral arterial disease (HCC)   . Type 2 diabetes mellitus (HCC)     Past Surgical History:  Procedure Laterality Date  . ABDOMINAL AORTAGRAM N/A 03/30/2015   Procedure: ABDOMINAL [name];  Surgeon: Fransisco Hertz, MD;  Location: [facility] CATH LAB;  Service: Cardiovascular;  Laterality: N/A;  . AMPUTATION Right 07/08/2015   Procedure: AMPUTATION BELOW RIGHT KNEE;  Surgeon: Nadara Mustard, MD;  Location: [facility] OR;  Service: Orthopedics;  Laterality: Right;  . BELOW KNEE LEG AMPUTATION Right 07/08/15   Dr. Lajoyce Corners  . CARDIAC CATHETERIZATION N/A 05/06/2015   Procedure: Left Heart Cath and Cors/Grafts Angiography;  Surgeon: Tonny Bollman, MD;  Location: [facility] INVASIVE CV LAB;  Service: Cardiovascular;  Laterality: N/A;  . COLONOSCOPY    . CORONARY ARTERY BYPASS GRAFT  2011   Three vessel by report  . ENDARTERECTOMY FEMORAL Right 05/13/2015   Procedure: RIGHT COMMON FEMORAL, SUPERFICIAL FEMORAL AND PROFUNDA ENDARTERECTOMY ;  Surgeon: Pryor Ochoa, MD;  Location: [facility] OR;  Service: Vascular;  Laterality: Right;  . FEMORAL-TIBIAL BYPASS GRAFT Right 05/13/2015   Procedure: BYPASS GRAFT RIGHT  FEMORAL-POSTERIOR TIBIAL ARTERY USING LEFT NONREVERSED TRANSLOCATED GREATER SAPPHENOUS VEIN;  Surgeon: Pryor OchoaJames D Lawson, MD;  [facility];  Service: Vascular;  Laterality: Right;  . FEMORAL-TIBIAL BYPASS GRAFT Right 05/27/2015   Procedure: THROMBECTOMY OF RIGHT FEMORAL-POSTERIOR TIBIAL ARTERY SAPHENOUS VEIN BYPASS GRAFT ;  Surgeon: Pryor OchoaJames D Lawson, MD;  [facility];  Service:  Vascular;  Laterality: Right;  . INTRAOPERATIVE ARTERIOGRAM Right 05/13/2015   Procedure: RIGHT LOWER LEG INTRA OPERATIVE ARTERIOGRAM;  Surgeon: Pryor OchoaJames D Lawson, MD;  [facility];  Service: Vascular;  Laterality: Right;  . INTRAOPERATIVE ARTERIOGRAM Right 05/27/2015   Procedure: INTRA OPERATIVE ARTERIOGRAM;  Surgeon: Pryor OchoaJames D Lawson, MD;  [facility];  Service: Vascular;  Laterality: Right;  . LOWER EXTREMITY ANGIOGRAM N/A 03/30/2015   Procedure: LOWER EXTREMITY ANGIOGRAM;  Surgeon: Fransisco HertzBrian L Chen, MD;  [facility];  Service: Cardiovascular;  Laterality: N/A;  . PATCH ANGIOPLASTY Right 05/13/2015   Procedure: RIGHT FEMORAL ARTERY PATCH ANGIOPLASTY;  Surgeon: Pryor OchoaJames D Lawson, MD;  [facility];  Service: Vascular;  Laterality: Right;  . STUMP REVISION Right 08/28/2015   Procedure: Revision Right Below Knee Amputation;  Surgeon: Nadara MustardMarcus Duda V, MD;  [facility];  Service: Orthopedics;  Laterality: Right;  . STUMP REVISION Right 10/30/2015   Procedure: Revision Right Below Knee Amputation;  Surgeon: Nadara MustardMarcus Duda V, MD;  [facility];  Service: Orthopedics;  Laterality: Right;  . THROMBECTOMY FEMORAL ARTERY Right 05/13/2015   Procedure: THROMBECTOMY RIGHT FEMORAL-PROXIMAL POSTERIOR TIBAIL ARTERY BYPASS GRAFT;  Surgeon: Larina Earthlyodd F Early, MD;  [facility];  Service: Vascular;  Laterality: Right;  . VEIN HARVEST Left 05/13/2015   Procedure: LEFT GREATER SAPPHENOUS VEIN HARVEST;  Surgeon: Pryor OchoaJames D Lawson, MD;  [facility];  Service: Vascular;  Laterality: Left;    Current Medications: Outpatient Medications Prior to Visit  Medication Sig Dispense Refill  . aspirin EC 81 MG tablet Take 81 mg by mouth daily.    Marland Kitchen. gabapentin (NEURONTIN) 300 MG capsule Take 300 mg by mouth 3 (three) times daily.     Marland Kitchen. glimepiride (AMARYL) 2 MG tablet Take 1 tablet (2 mg total) by mouth daily with breakfast. 90 tablet 0  . lisinopril (PRINIVIL,ZESTRIL) 5 MG tablet Take 1 tablet (5 mg total) by mouth daily.  90 tablet 3  . metFORMIN (GLUCOPHAGE) 1000 MG tablet Take 1 tablet (1,000 mg total) by mouth 2 (two) times daily with a meal. 180 tablet 3  . oxyCODONE-acetaminophen (PERCOCET/ROXICET) 5-325 MG tablet Take 1 tablet by mouth 2 (two) times daily as needed for moderate pain.   0  . warfarin (COUMADIN) 5 MG tablet TAKE 2 TABLETS (10 MG TOTAL) BY MOUTH DAILY WITH SUPPER. OR AS INSTRUCTED BY ANTICOAGULATION CLINIC. 60 tablet 1   No facility-administered medications prior to visit.      Allergies:   Fish-derived products and Penicillins   Social History   Social History  . Marital status: Single    Spouse name: N/A  . Number of children: N/A  . Years of education: N/A   Social History Main Topics  . Smoking status: Light Tobacco Smoker    Packs/day: 0.50    Years: 15.00    Types: Cigarettes    Start date: 08/24/1975  . Smokeless tobacco: Never Used  . Alcohol use No  . Drug use: No  . Sexual activity: Not Currently   Other Topics Concern  . None   Social History Narrative  . None  Family History:  The patient's   family history includes Cancer in his father.   ROS:   Please see the history of present illness.    Review of Systems  Constitution: Negative.  HENT: Negative.   Cardiovascular: Negative.   Respiratory: Negative.   Endocrine: Negative.   Hematologic/Lymphatic: Negative.   Skin: Positive for dry skin and poor wound healing.       excema  Musculoskeletal: Negative.        Right below the knee amputation  Gastrointestinal: Negative.   Genitourinary: Negative.   Neurological: Negative.    All other systems reviewed and are negative.   PHYSICAL EXAM:   VS:  BP 138/64   Pulse 84   Ht 5\' 10"  (1.778 m)   Wt 188 lb (85.3 kg)   SpO2 96%   BMI 26.98 kg/m   Physical Exam  GEN: Well nourished, well developed, in no acute distress    Neck: Bilateral carotid bruits left greater than right no JVD,  or masses Cardiac:RRR; no murmurs, rubs, or gallops    Respiratory: Decreased breath sounds but clear to auscultation bilaterally, normal work of breathing GI: soft, nontender, nondistended, + BS Ext: Right below the knee amputation, left leg cyanotic lower leg wrapped because of eczema decreased distal pulses MS: Right below the knee amputation no deformity or atrophy  Skin: Left lower extremity is wrapped because of eczema   Neuro:  Alert and Oriented x 3, Strength and sensation are intact Psych: euthymic mood, full affect  Wt Readings from Last 3 Encounters:  07/20/16 188 lb (85.3 kg)  07/14/16 182 lb (82.6 kg)  04/15/16 181 lb (82.1 kg)      Studies/Labs Reviewed:   EKG:  EKG is Not ordered today.  T EKG reviewed from 07/14/16 normal sinus rhythm with old inferior infarct, no acute change  Recent Labs: 10/30/2015: ALT 37 07/14/2016: BUN 21; Creatinine, Ser 0.83; Hemoglobin 14.0; Platelets 153; Potassium 4.2; Sodium 136   Lipid Panel    Component Value Date/Time   CHOL 219 (H) 09/28/2015 0928   TRIG 146 09/28/2015 0928   HDL 46 09/28/2015 0928   CHOLHDL 4.8 09/28/2015 0928   CHOLHDL 6.8 04/29/2015 1427   VLDL 77 (H) 04/29/2015 1427   LDLCALC 144 (H) 09/28/2015 0928   LDLDIRECT 158 (H) 09/28/2015 0928    Additional studies/ records that were reviewed today include:   Nuclear stress test 04/27/15  There was no ST segment deviation noted during stress.  Findings consistent with ischemia and prior myocardial infarction.  This is a high risk study. High risk due to low ejection fraction and large area of ischemia.  The left ventricular ejection fraction is moderately decreased (30-44%).  There is a large inferior and inferolateral infarct  There is a large area of iscehmia in the anterolateral wall  There is borderline elevation of TID ratio (1.22)   Cardiac catheterization 05/06/15   1. Severe native three-vessel coronary artery disease with total occlusion of the proximal RCA, total occlusion of the proximal left  circumflex, and total occlusion of the LAD after the first septal perforator 2. Status post aortocoronary bypass surgery with continued patency of the LIMA to LAD, saphenous vein graft to intermediate, and 7 his vein graft to right PDA 3. Total occlusion of the AV groove circumflex collateralized from the saphenous vein graft to intermediate 4. Moderately severe segmental left ventricular systolic dysfunction with LVEF estimated at 35%   Discussion: The patient has continued patency of his  bypass grafts. He may have ischemia in the distribution of the second and third obtuse marginal branches which are collateralized. Otherwise, he has what appears to be normal perfusion to the LAD, diagonal/intermediate, and RCA territories based on patency of all bypass grafts. I suspect he will be able to proceed with surgery and there does not appear to be any indication for further coronary revascularization.     ASSESSMENT:    1. Coronary artery disease involving coronary bypass graft of native heart without angina pectoris   2. Preoperative clearance   3. Peripheral arterial disease (HCC)   4. Essential hypertension      PLAN:  In order of problems listed above:  CAD as listed above with last cath 04/2015 showing continued patency of bypass grafts some ischemia in the distribution of the second and third obtuse marginal branches which are collateralized. He denies angina and is able to walk over 100 yards without difficulty.  Preoperative clearance patient is here for dental extractions. He has CAD as listed above and ischemic cardiomyopathy EF 35%. He has had no trouble with congestive heart failure. Should be able to proceed with dental extractions without further cardiac workup.  Peripheral arterial disease on Coumadin. Does have carotid bruits so we'll order carotid Dopplers.  Essential hypertension controlled  Carotid bruits check carotid Dopplers    Medication Adjustments/Labs and Tests  Ordered: Current medicines are reviewed at length with the patient today.  Concerns regarding medicines are outlined above.  Medication changes, Labs and Tests ordered today are listed in the Patient Instructions below. Patient Instructions  Your physician wants you to follow-up in: 1 Year with Dr. Diona Browner. You will receive a reminder letter in the mail two months in advance. If you don't receive a letter, please call our office to schedule the follow-up appointment.  Your physician has requested that you have a carotid duplex. This test is an ultrasound of the carotid arteries in your neck. It looks at blood flow through these arteries that supply the brain with blood. Allow one hour for this exam. There are no restrictions or special instructions.  Your physician recommends that you continue on your current medications as directed. Please refer to the Current Medication list given to you today.  If you need a refill on your cardiac medications before your next appointment, please call your pharmacy.  You have been cleared for surgery from a cardiac standpoint.   Thank you for choosing [facility]!  \    Signed, Jacolyn Reedy, PA-C  07/20/2016 12:06 PM    [facility] [address] [place], [place], [place]  [address] Phone: [phone]; Fax: [phone]   k

## 2016-07-20 NOTE — Patient Instructions (Addendum)
Your physician wants you to follow-up in: 1 Year with Dr. Diona BrownerMcDowell. You will receive a reminder letter in the mail two months in advance. If you don't receive a letter, please call our office to schedule the follow-up appointment.  Your physician has requested that you have a carotid duplex. This test is an ultrasound of the carotid arteries in your neck. It looks at blood flow through these arteries that supply the brain with blood. Allow one hour for this exam. There are no restrictions or special instructions.  Your physician recommends that you continue on your current medications as directed. Please refer to the Current Medication list given to you today.  If you need a refill on your cardiac medications before your next appointment, please call your pharmacy.  You have been cleared for surgery from a cardiac standpoint.   Thank you for choosing [facility]!  \

## 2016-07-21 ENCOUNTER — Ambulatory Visit (HOSPITAL_COMMUNITY)
Admission: RE | Admit: 2016-07-21 | Discharge: 2016-07-21 | Disposition: A | Discharge status: Home / Self Care | Payer: Medicare Other | Source: Ambulatory Visit | Attending: Physician Assistant | Admitting: Physician Assistant

## 2016-07-21 ENCOUNTER — Encounter: Payer: Medicare (Managed Care) | Admitting: Physical Therapy

## 2016-07-21 DIAGNOSIS — I6523 Occlusion and stenosis of bilateral carotid arteries: Secondary | ICD-10-CM | POA: Diagnosis not present

## 2016-07-21 DIAGNOSIS — I25810 Atherosclerosis of coronary artery bypass graft(s) without angina pectoris: Secondary | ICD-10-CM | POA: Diagnosis not present

## 2016-07-22 NOTE — Progress Notes (Addendum)
Anesthesia follow-up: See my note from 07/14/16. Patient had dental restoration surgery scheduled for 07/22/16 by Dr. Barbette MerinoJensen. He was referred for cardiology clearance and was seen on 07/20/16, but Dr. Barbette MerinoJensen had to postpone surgery anyway due to scheduling conflicts. Case has been rescheduled for 08/12/16 (first case).   Updates since my 07/14/16 note: - He did not go to his endocrinology visit with Dr. Fransico HimNida. (A1c 8.8 07/14/16; reported fasting glucose ~ 122.)  - [facility] with Jacolyn ReedyMichele Lenze, PA-C. She wrote, "Preoperative clearance patient is here for dental extractions. He has CAD as listed above and ischemic cardiomyopathy EF 35%. He has had no trouble with congestive heart failure. Should be able to proceed with dental extractions without further cardiac workup." One year follow-up recommended.  - 07/21/16 Carotid U/S: IMPRESSION: 1. Moderate right carotid bifurcation proximal ICA atherosclerotic vascular disease. Slight elevation of flow velocities and velocity ratios. Degree of stenosis visually is in the 50-69% range. 2. Moderate left carotid bifurcation and proximal ICA atherosclerotic vascular disease. Degree of stenosis less than 50% mild flow velocity velocity ratios as well as visually . 3.  Vertebral arteries are patent antegrade flow.  His labs were unremarkable on 07/14/16, but they will need updating prior to surgery. I'll see if our scheduler is able to get him in for a lab only visit ([place] lives in [place], [place] otherwise he will have to get labs done on arrival on the day of surgery which is not ideal for first cases.  Hector Ochsllison Hector Dines, PA-C [facility] Short Stay Center/Anesthesiology Phone [phone] 07/22/2016 5:10 PM  Addendum: It looks like patient was a no show for his 08/08/16 PAT visit to get updated labs. He will need STAT labs on arrival including PT/INR. Per Sam at Dr. Randa EvensJensen's office, patient's warfarin has been on hold since 08/07/16.  Hector Ochsllison Dustyn Dansereau,  PA-C [facility]/Anesthesiology Phone [phone] 08/10/2016 9:05 AM

## 2016-08-03 ENCOUNTER — Other Ambulatory Visit: Payer: Self-pay | Admitting: Family Medicine

## 2016-08-08 ENCOUNTER — Inpatient Hospital Stay (HOSPITAL_COMMUNITY)
Admission: RE | Admit: 2016-08-08 | Discharge: 2016-08-08 | Disposition: A | Discharge status: Home / Self Care | Payer: Medicare Other | Source: Ambulatory Visit

## 2016-08-08 DIAGNOSIS — G894 Chronic pain syndrome: Secondary | ICD-10-CM | POA: Diagnosis not present

## 2016-08-08 DIAGNOSIS — L409 Psoriasis, unspecified: Secondary | ICD-10-CM | POA: Diagnosis not present

## 2016-08-08 DIAGNOSIS — I10 Essential (primary) hypertension: Secondary | ICD-10-CM | POA: Diagnosis not present

## 2016-08-08 DIAGNOSIS — G546 Phantom limb syndrome with pain: Secondary | ICD-10-CM | POA: Diagnosis not present

## 2016-08-08 DIAGNOSIS — E1159 Type 2 diabetes mellitus with other circulatory complications: Secondary | ICD-10-CM | POA: Diagnosis not present

## 2016-08-08 NOTE — Pre-Procedure Instructions (Signed)
Chales SalmonJohn W Klingler                      08/08/2016                                              [facility] [phone] - [place], [place] - [address] [place] [address] Phone: [phone] Fax: [phone]  [facility] [place] - [address] [place] [address] Phone: [phone] Fax: [phone]                        Your procedure is scheduled on Friday September 1.                      [facility] Ad[facility] at 5:30 A.M.                      Call this number if you have problems the morning of surgery:                      [phone]                       Remember:                      Do not eat food or drink liquids after midnight.                      Take these medicines the morning of surgery with A SIP OF WATER: gabapentin (neurontin), oxycodone (percocet) if needed  7 days prior to surgery STOP taking any Aspirin, Aleve, Naproxen, Ibuprofen, Motrin, Advil, Goody's, BC's, all herbal medications, fish oil, and all vitamins   WHAT DO I DO ABOUT MY DIABETES MEDICATION?    Do not take oral diabetes medicines (pills) the morning of surgery. (DO NOT take metformin (glucophage) or glimepiride (amaryl) the day of surgery.       HOW TO MANAGE YOUR DIABETES BEFORE AND AFTER SURGERY  Why is it important to control my blood sugar before and after surgery?  Improving blood sugar levels before and after surgery helps healing and can limit problems.  A way of improving blood sugar control is eating a healthy diet by: ?  Eating less sugar and carbohydrates ?  Increasing activity/exercise ?  Talking with your doctor about reaching your blood sugar goals  High blood sugars (greater than 180 mg/dL) can raise your risk of infections and slow your recovery, so you will need to focus on controlling your diabetes during the weeks before  surgery.  Make sure that the doctor who takes care of your diabetes knows about your planned surgery including the date and location.  How do I manage my blood sugar before surgery?  Check your blood sugar at least 4 times a day, starting 2 days before surgery, to make sure that the level is not too high or low. ? Check your blood sugar the morning of your surgery when you wake up and every 2 hours until you get to the Short Stay unit.  If your blood sugar  is less than 70 mg/dL, you will need to treat for low blood sugar: ? Do not take insulin. ? Treat a low blood sugar (less than 70 mg/dL) with  cup of clear juice (cranberry or apple), 4glucose tablets, OR glucose gel. ? Recheck blood sugar in 15 minutes after treatment (to make sure it is greater than 70 mg/dL). If your blood sugar is not greater than 70 mg/dL on recheck, call [phone] for further instructions.  Report your blood sugar to the short stay nurse when you get to Short Stay.   If you are admitted to the hospital after surgery: ? Your blood sugar will be checked by the staff and you will probably be given insulin after surgery (instead of oral diabetes medicines) to make sure you have good blood sugar levels. ? The goal for blood sugar control after surgery is 80-180 mg/dL.                                 Do not wear jewelry, make-up or nail polish.                      Do not wear lotions, powders, or perfumes, or deoderant.                      Do not shave 48 hours prior to surgery.  Men may shave face and neck.                      Do not bring valuables to the hospital.                      [facility] is not responsible for any belongings or valuables.  Contacts, dentures or bridgework may not be worn into surgery.  Leave your suitcase in the car.  After surgery it may be brought to your room.  For patients admitted to the hospital, discharge time will be determined by your treatment  team.  Patients discharged the day of surgery will not be allowed to drive home.    Special instructions:    [facility]- Preparing For Surgery  Before surgery, you can play an important role. Because skin is not sterile, your skin needs to be as free of germs as possible. You can reduce the number of germs on your skin by washing with CHG (chlorahexidine gluconate) Soap before surgery.  CHG is an antiseptic cleaner which kills germs and bonds with the skin to continue killing germs even after washing.  Please do not use if you have an allergy to CHG or antibacterial soaps. If your skin becomes reddened/irritated stop using the CHG.  Do not shave (including legs and underarms) for at least 48 hours prior to first CHG shower. It is OK to shave your face.  Please follow these instructions carefully.  1. Shower the NIGHT BEFORE SURGERY and the MORNING OF SURGERY with CHG.   2. If you chose to wash your hair, wash your hair first as usual with your normal shampoo.  3. After you shampoo, rinse your hair and body thoroughly to remove the shampoo.  4. Use CHG as you would any other liquid soap. You can apply CHG directly to the skin and wash gently with a scrungie or a clean washcloth.   5. Apply the CHG Soap to your body ONLY FROM THE NECK DOWN.  Do not use on open wounds or open sores. Avoid contact with your eyes, ears, mouth and genitals (private parts). Wash genitals (private parts) with your normal soap.  6. Wash thoroughly, paying special attention to the area where your surgery will be performed.  7. Thoroughly rinse your body with warm water from the neck down.  8. DO NOT shower/wash with your normal soap after using and rinsing off the CHG Soap.  9. Pat yourself dry with a CLEAN TOWEL.   10. Wear CLEAN PAJAMAS   11. Place CLEAN SHEETS on  your bed the night of your first shower and DO NOT SLEEP WITH PETS.    Day of Surgery: Do not apply any deodorants/lotions. Please wear clean clothes to the hospital/surgery center.

## 2016-08-11 ENCOUNTER — Encounter (HOSPITAL_COMMUNITY): Payer: Self-pay | Admitting: *Deleted

## 2016-08-11 NOTE — Progress Notes (Signed)
I instructed patient to check CBG to check CBG and if it is less than 70 to treat it with Glucose Gel, Glucose tablets or 1/2 cup of clear juice like apple juice or cranberry juice, or 1/2 cup of regular soda. (not cream soda). I instructed patient to recheck CBG in 15 minutes and if CBG is not greater than 70, to  Call [phone] (pre- op). If it is before pre-op opens to retreat as before and recheck CBG in 15 minutes. I told patient to make note of time that liquid is taken and amount, that surgical time may have to be adjusted.  

## 2016-08-12 ENCOUNTER — Ambulatory Visit (HOSPITAL_COMMUNITY): Payer: Medicare (Managed Care) | Attending: Physical Therapy

## 2016-08-12 ENCOUNTER — Ambulatory Visit (HOSPITAL_COMMUNITY): Admission: RE | Admit: 2016-08-12 | Payer: Medicare Other | Source: Ambulatory Visit | Admitting: Oral Surgery

## 2016-08-12 SURGERY — DENTAL RESTORATION/EXTRACTIONS
Anesthesia: General

## 2016-08-16 ENCOUNTER — Telehealth (HOSPITAL_COMMUNITY): Payer: Self-pay | Admitting: Physical Therapy

## 2016-08-16 NOTE — Telephone Encounter (Signed)
08/16/16 called pt ask him if he wanted to reschedule this missed apptment, patient declined. NF 08/16/16

## 2016-09-13 ENCOUNTER — Ambulatory Visit: Payer: Medicare Other | Admitting: "Endocrinology

## 2016-09-14 DIAGNOSIS — M545 Low back pain: Secondary | ICD-10-CM | POA: Diagnosis not present

## 2016-09-20 DIAGNOSIS — M545 Low back pain: Secondary | ICD-10-CM | POA: Diagnosis not present

## 2016-09-20 DIAGNOSIS — M549 Dorsalgia, unspecified: Secondary | ICD-10-CM | POA: Diagnosis not present

## 2016-09-20 DIAGNOSIS — M79605 Pain in left leg: Secondary | ICD-10-CM | POA: Diagnosis not present

## 2016-09-20 DIAGNOSIS — M6281 Muscle weakness (generalized): Secondary | ICD-10-CM | POA: Diagnosis not present

## 2016-09-22 DIAGNOSIS — M6281 Muscle weakness (generalized): Secondary | ICD-10-CM | POA: Diagnosis not present

## 2016-09-22 DIAGNOSIS — M545 Low back pain: Secondary | ICD-10-CM | POA: Diagnosis not present

## 2016-09-22 DIAGNOSIS — M79605 Pain in left leg: Secondary | ICD-10-CM | POA: Diagnosis not present

## 2016-09-22 DIAGNOSIS — M549 Dorsalgia, unspecified: Secondary | ICD-10-CM | POA: Diagnosis not present

## 2016-09-23 DIAGNOSIS — M6281 Muscle weakness (generalized): Secondary | ICD-10-CM | POA: Diagnosis not present

## 2016-09-23 DIAGNOSIS — M549 Dorsalgia, unspecified: Secondary | ICD-10-CM | POA: Diagnosis not present

## 2016-09-23 DIAGNOSIS — M545 Low back pain: Secondary | ICD-10-CM | POA: Diagnosis not present

## 2016-09-23 DIAGNOSIS — M79605 Pain in left leg: Secondary | ICD-10-CM | POA: Diagnosis not present

## 2016-09-28 DIAGNOSIS — M549 Dorsalgia, unspecified: Secondary | ICD-10-CM | POA: Diagnosis not present

## 2016-09-28 DIAGNOSIS — M6281 Muscle weakness (generalized): Secondary | ICD-10-CM | POA: Diagnosis not present

## 2016-09-28 DIAGNOSIS — M79605 Pain in left leg: Secondary | ICD-10-CM | POA: Diagnosis not present

## 2016-09-28 DIAGNOSIS — M545 Low back pain: Secondary | ICD-10-CM | POA: Diagnosis not present

## 2016-10-03 DIAGNOSIS — M545 Low back pain: Secondary | ICD-10-CM | POA: Diagnosis not present

## 2016-10-03 DIAGNOSIS — M549 Dorsalgia, unspecified: Secondary | ICD-10-CM | POA: Diagnosis not present

## 2016-10-03 DIAGNOSIS — M79605 Pain in left leg: Secondary | ICD-10-CM | POA: Diagnosis not present

## 2016-10-03 DIAGNOSIS — M6281 Muscle weakness (generalized): Secondary | ICD-10-CM | POA: Diagnosis not present

## 2016-10-27 DIAGNOSIS — G894 Chronic pain syndrome: Secondary | ICD-10-CM | POA: Diagnosis not present

## 2016-10-27 DIAGNOSIS — E1159 Type 2 diabetes mellitus with other circulatory complications: Secondary | ICD-10-CM | POA: Diagnosis not present

## 2016-10-27 DIAGNOSIS — Z7901 Long term (current) use of anticoagulants: Secondary | ICD-10-CM | POA: Diagnosis not present

## 2016-10-31 NOTE — Progress Notes (Signed)
This encounter was created in error - please disregard.

## 2016-11-09 ENCOUNTER — Other Ambulatory Visit: Payer: Self-pay | Admitting: Family Medicine

## 2016-12-01 ENCOUNTER — Encounter: Payer: Self-pay | Admitting: Physical Therapy

## 2016-12-01 DIAGNOSIS — I739 Peripheral vascular disease, unspecified: Secondary | ICD-10-CM | POA: Diagnosis not present

## 2016-12-01 DIAGNOSIS — Z720 Tobacco use: Secondary | ICD-10-CM | POA: Diagnosis not present

## 2016-12-01 DIAGNOSIS — I10 Essential (primary) hypertension: Secondary | ICD-10-CM | POA: Diagnosis not present

## 2016-12-01 DIAGNOSIS — I25810 Atherosclerosis of coronary artery bypass graft(s) without angina pectoris: Secondary | ICD-10-CM | POA: Diagnosis not present

## 2016-12-01 NOTE — Therapy (Signed)
[name] [address] [place], [place], [address] Phone: [phone]   Fax:  [phone]  Patient Details  Name: Hector Green MRN: [id number] Date of Birth: [birth year] Referring Provider:  Meridee Score, MD  Encounter Date: 12/01/2016  PHYSICAL THERAPY DISCHARGE SUMMARY  Visits from Start of Care: 4  Current functional level related to goals / functional outcomes: Unknown as did not complete course of PT.     PT Long Term Goals - 05/24/16 1425      PT LONG TERM GOAL #1   Title Pt will be independent with prosthetic care and residual limb care to enable safe wear & use of prosthesis (Target Date: 07/22/2016)   Time 2   Period Months   Status New     PT LONG TERM GOAL #2   Title Pt will tolerate wear of prothesis >90% of all awake hours to enable increase function throughout his awake hours (Target Date: 07/22/2016)   Time 2   Period Months   Status New     PT LONG TERM GOAL #3   Title Pt will be able to negotiate ramps, curbs and stairs modified independent with LRAD & prosthesis to enable increased mobiltiy in the commnunity (Target Date: 07/22/2016)   Time 2   Period Months   Status New     PT LONG TERM GOAL #4   Title Pt will be able to ambulate 800' with LRAD & prosthesis including grass, unlevel surfaces to enable increased community mobility (Target Date: 07/22/2016)   Time 2   Period Months   Status New     PT LONG TERM GOAL #5   Title Pt will improve Berg Balance Score to > 36/56 to indicate a decreased risk of falls (Target Date: 07/22/2016)   Time 2   Period Months   Status New        Remaining deficits: Unknown as did not return for PT.    Education / Equipment: Prosthetic care.   Plan: Patient agrees to discharge.  Patient goals were not met. Patient is being discharged due to not returning since the last visit.  ?????         Summerlynn Glauser  PT, DPT 12/01/2016, 12:19  PM  [facility] [address] [place], [place], [address] Phone: [phone]   Fax:  [phone]

## 2016-12-07 ENCOUNTER — Other Ambulatory Visit: Payer: Self-pay | Admitting: Vascular Surgery

## 2016-12-07 DIAGNOSIS — L97929 Non-pressure chronic ulcer of unspecified part of left lower leg with unspecified severity: Secondary | ICD-10-CM

## 2016-12-09 DIAGNOSIS — Z79891 Long term (current) use of opiate analgesic: Secondary | ICD-10-CM | POA: Diagnosis not present

## 2016-12-09 DIAGNOSIS — G894 Chronic pain syndrome: Secondary | ICD-10-CM | POA: Diagnosis not present

## 2016-12-09 DIAGNOSIS — G546 Phantom limb syndrome with pain: Secondary | ICD-10-CM | POA: Diagnosis not present

## 2016-12-09 DIAGNOSIS — M545 Low back pain: Secondary | ICD-10-CM | POA: Diagnosis not present

## 2016-12-09 DIAGNOSIS — M5137 Other intervertebral disc degeneration, lumbosacral region: Secondary | ICD-10-CM | POA: Diagnosis not present

## 2016-12-14 ENCOUNTER — Ambulatory Visit (HOSPITAL_COMMUNITY): Payer: Medicare Other

## 2016-12-14 ENCOUNTER — Ambulatory Visit (HOSPITAL_COMMUNITY): Payer: Medicare Other | Attending: Vascular Surgery

## 2016-12-16 ENCOUNTER — Encounter: Payer: Medicare Other | Admitting: Vascular Surgery

## 2016-12-27 ENCOUNTER — Encounter: Payer: Self-pay | Admitting: Vascular Surgery

## 2016-12-30 ENCOUNTER — Ambulatory Visit (HOSPITAL_COMMUNITY): Payer: Medicare Other | Attending: Vascular Surgery

## 2016-12-30 ENCOUNTER — Ambulatory Visit (HOSPITAL_COMMUNITY): Admission: RE | Admit: 2016-12-30 | Payer: Medicare Other | Source: Ambulatory Visit

## 2016-12-30 ENCOUNTER — Other Ambulatory Visit (HOSPITAL_COMMUNITY): Payer: Self-pay | Admitting: Internal Medicine

## 2016-12-30 ENCOUNTER — Encounter: Payer: Medicare Other | Admitting: Vascular Surgery

## 2016-12-30 DIAGNOSIS — M79606 Pain in leg, unspecified: Secondary | ICD-10-CM

## 2016-12-30 DIAGNOSIS — M7989 Other specified soft tissue disorders: Principal | ICD-10-CM

## 2017-01-09 ENCOUNTER — Other Ambulatory Visit: Payer: Self-pay | Admitting: Family Medicine

## 2017-01-10 NOTE — Telephone Encounter (Signed)
LMOVM NTBS before next refill 

## 2017-01-30 DIAGNOSIS — E1159 Type 2 diabetes mellitus with other circulatory complications: Secondary | ICD-10-CM | POA: Diagnosis not present

## 2017-01-30 DIAGNOSIS — G894 Chronic pain syndrome: Secondary | ICD-10-CM | POA: Diagnosis not present

## 2017-01-30 DIAGNOSIS — G546 Phantom limb syndrome with pain: Secondary | ICD-10-CM | POA: Diagnosis not present

## 2017-02-07 ENCOUNTER — Other Ambulatory Visit: Payer: Self-pay | Admitting: Family Medicine

## 2017-03-04 ENCOUNTER — Encounter (HOSPITAL_COMMUNITY): Payer: Self-pay | Admitting: Emergency Medicine

## 2017-03-04 ENCOUNTER — Emergency Department (HOSPITAL_COMMUNITY): Payer: Medicare Other

## 2017-03-04 ENCOUNTER — Emergency Department (HOSPITAL_COMMUNITY)
Admission: EM | Admit: 2017-03-04 | Discharge: 2017-03-04 | Disposition: A | Discharge status: Home / Self Care | Payer: Medicare Other | Attending: Emergency Medicine | Admitting: Emergency Medicine

## 2017-03-04 DIAGNOSIS — G8918 Other acute postprocedural pain: Secondary | ICD-10-CM | POA: Insufficient documentation

## 2017-03-04 DIAGNOSIS — E119 Type 2 diabetes mellitus without complications: Secondary | ICD-10-CM | POA: Diagnosis not present

## 2017-03-04 DIAGNOSIS — I714 Abdominal aortic aneurysm, without rupture: Secondary | ICD-10-CM | POA: Diagnosis not present

## 2017-03-04 DIAGNOSIS — F17210 Nicotine dependence, cigarettes, uncomplicated: Secondary | ICD-10-CM | POA: Insufficient documentation

## 2017-03-04 DIAGNOSIS — L03116 Cellulitis of left lower limb: Secondary | ICD-10-CM | POA: Insufficient documentation

## 2017-03-04 DIAGNOSIS — R7989 Other specified abnormal findings of blood chemistry: Secondary | ICD-10-CM | POA: Diagnosis not present

## 2017-03-04 DIAGNOSIS — Z79899 Other long term (current) drug therapy: Secondary | ICD-10-CM | POA: Insufficient documentation

## 2017-03-04 DIAGNOSIS — I739 Peripheral vascular disease, unspecified: Secondary | ICD-10-CM | POA: Diagnosis not present

## 2017-03-04 DIAGNOSIS — T8189XA Other complications of procedures, not elsewhere classified, initial encounter: Secondary | ICD-10-CM | POA: Diagnosis not present

## 2017-03-04 DIAGNOSIS — Z7984 Long term (current) use of oral hypoglycemic drugs: Secondary | ICD-10-CM | POA: Insufficient documentation

## 2017-03-04 DIAGNOSIS — M7989 Other specified soft tissue disorders: Secondary | ICD-10-CM | POA: Diagnosis not present

## 2017-03-04 DIAGNOSIS — Z720 Tobacco use: Secondary | ICD-10-CM

## 2017-03-04 DIAGNOSIS — Z7982 Long term (current) use of aspirin: Secondary | ICD-10-CM | POA: Insufficient documentation

## 2017-03-04 DIAGNOSIS — I2510 Atherosclerotic heart disease of native coronary artery without angina pectoris: Secondary | ICD-10-CM | POA: Insufficient documentation

## 2017-03-04 DIAGNOSIS — M79605 Pain in left leg: Secondary | ICD-10-CM | POA: Diagnosis not present

## 2017-03-04 DIAGNOSIS — I10 Essential (primary) hypertension: Secondary | ICD-10-CM | POA: Insufficient documentation

## 2017-03-04 DIAGNOSIS — S80822A Blister (nonthermal), left lower leg, initial encounter: Secondary | ICD-10-CM | POA: Diagnosis not present

## 2017-03-04 DIAGNOSIS — R778 Other specified abnormalities of plasma proteins: Secondary | ICD-10-CM

## 2017-03-04 DIAGNOSIS — T148XXA Other injury of unspecified body region, initial encounter: Secondary | ICD-10-CM

## 2017-03-04 LAB — CBC WITH DIFFERENTIAL/PLATELET
Basophils Absolute: 0.1 10*3/uL (ref 0.0–0.1)
Basophils Relative: 1 %
EOS ABS: 0.4 10*3/uL (ref 0.0–0.7)
EOS PCT: 4 %
HCT: 39.9 % (ref 39.0–52.0)
Hemoglobin: 13.6 g/dL (ref 13.0–17.0)
LYMPHS ABS: 1.7 10*3/uL (ref 0.7–4.0)
LYMPHS PCT: 17 %
MCH: 30.1 pg (ref 26.0–34.0)
MCHC: 34.1 g/dL (ref 30.0–36.0)
MCV: 88.3 fL (ref 78.0–100.0)
MONO ABS: 0.7 10*3/uL (ref 0.1–1.0)
Monocytes Relative: 7 %
Neutro Abs: 7.3 10*3/uL (ref 1.7–7.7)
Neutrophils Relative %: 71 %
PLATELETS: 189 10*3/uL (ref 150–400)
RBC: 4.52 MIL/uL (ref 4.22–5.81)
RDW: 13.5 % (ref 11.5–15.5)
WBC: 10.1 10*3/uL (ref 4.0–10.5)

## 2017-03-04 LAB — I-STAT CG4 LACTIC ACID, ED
Lactic Acid, Venous: 4.22 mmol/L (ref 0.5–1.9)
Lactic Acid, Venous: 1.54 mmol/L (ref 0.5–1.9)

## 2017-03-04 LAB — BASIC METABOLIC PANEL
Anion gap: 11 (ref 5–15)
BUN: 34 mg/dL — AB (ref 6–20)
CHLORIDE: 108 mmol/L (ref 101–111)
CO2: 21 mmol/L — ABNORMAL LOW (ref 22–32)
Calcium: 9.3 mg/dL (ref 8.9–10.3)
Creatinine, Ser: 1.53 mg/dL — ABNORMAL HIGH (ref 0.61–1.24)
GFR calc Af Amer: 58 mL/min — ABNORMAL LOW (ref >60)
GFR, EST NON AFRICAN AMERICAN: 50 mL/min — AB (ref >60)
GLUCOSE: 174 mg/dL — AB (ref 65–99)
Potassium: 4.7 mmol/L (ref 3.5–5.1)
SODIUM: 140 mmol/L (ref 135–145)

## 2017-03-04 LAB — PROTIME-INR
INR: 1.02
PROTHROMBIN TIME: 13.4 s (ref 11.4–15.2)

## 2017-03-04 LAB — TROPONIN I: Troponin I: 0.29 ng/mL (ref <0.03)

## 2017-03-04 MED ORDER — SULFAMETHOXAZOLE-TRIMETHOPRIM 800-160 MG PO TABS: 1.0000 | ORAL_TABLET | Freq: Two times a day (BID) | ORAL | 0 refills | Status: AC

## 2017-03-04 MED ORDER — MORPHINE SULFATE (PF) 4 MG/ML IV SOLN
4.0000 mg | Freq: Once | INTRAVENOUS | Status: AC
  Administered 2017-03-04: 4 mg via INTRAVENOUS
  Filled 2017-03-04: qty 1

## 2017-03-04 MED ORDER — VANCOMYCIN HCL IN DEXTROSE 1-5 GM/200ML-% IV SOLN
1000.0000 mg | Freq: Once | INTRAVENOUS | Status: AC
  Administered 2017-03-04: 1000 mg via INTRAVENOUS
  Filled 2017-03-04: qty 200

## 2017-03-04 MED ORDER — SODIUM CHLORIDE 0.9 % IV BOLUS (SEPSIS)
1000.0000 mL | Freq: Once | INTRAVENOUS | Status: AC
  Administered 2017-03-04: 1000 mL via INTRAVENOUS

## 2017-03-04 MED ORDER — HYDROCODONE-ACETAMINOPHEN 5-325 MG PO TABS: 1.0000 | ORAL_TABLET | ORAL | 0 refills | Status: AC | PRN

## 2017-03-04 MED ORDER — ONDANSETRON HCL 4 MG/2ML IJ SOLN
4.0000 mg | Freq: Once | INTRAMUSCULAR | Status: AC
  Administered 2017-03-04: 4 mg via INTRAVENOUS
  Filled 2017-03-04: qty 2

## 2017-03-04 MED ORDER — IOPAMIDOL (ISOVUE-370) INJECTION 76%
150.0000 mL | Freq: Once | INTRAVENOUS | Status: AC | PRN
  Administered 2017-03-04: 150 mL via INTRAVENOUS

## 2017-03-04 NOTE — ED Provider Notes (Signed)
[facility]-EMERGENCY DEPT Provider Note   CSN: [id number] Arrival date & time: 03/04/17  1207  By signing my name below, I, Majel Homer, attest that this documentation has been prepared under the direction and in the presence of Jacalyn Lefevre, MD . Electronically Signed: Majel Homer, Scribe. 03/04/2017. 12:37 PM.  History   Chief Complaint Chief Complaint  Patient presents with  . Wound Check   The history is provided by the patient. No language interpreter was used.   HPI Comments: Hector Green is a 56 y.o. male with PMHx of HTN, HLD, CAD, and DM2, who presents to the Emergency Department for an evaluation of a painful, open wound to his left lower extremity that began ~1 year ago. 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 Lajoyce Corners at [facility] due to DM. He states that ~1 month after this surgery, he began to experience several "blisters" on his left lower leg that "wouldn't seem to heal." He states associated redness and swelling surrounding the wound on his left lower leg and a "throbbing" pain in his left foot. He notes he has applied a topical ointment that was prescribed by his surgeon ~11 months ago with no relief. Pt denies any other complaints.   Pt did f/u with [facility] cardiology (Dr. Jaymes Graff) in [place] on 12/01/16.  He was referred to vascular surgery, but has not yet been back.  His last ABI in 2016 of his left leg was 0.67.  His doctor scheduled another one for the 12/27, but pt did not go to that appt.  He was also supposed to f/u with his cardiologist in January, but did not.  Past Medical History:  Diagnosis Date  . Abnormal nuclear stress test 05/06/2015  . CAD (coronary artery disease) 2011   Multivessel s/p CABG in [place]  . Essential hypertension    not on medications at this time  . Hyperlipidemia   . Hypertension   . Myocardial infarction 2011  . Paranoid schizophrenia (HCC)   . Peripheral arterial disease (HCC)   . Type 2 diabetes mellitus (HCC)     type II    Patient Active Problem List   Diagnosis Date Noted  . Preoperative clearance 07/20/2016  . Toenail deformity 12/01/2015  . Healthcare maintenance 12/01/2015  . Below knee amputation status (HCC) 07/08/2015  . Paranoid schizophrenia, chronic condition (HCC) 06/26/2015  . Numbness and tingling-  Right Leg/ FOOT 06/10/2015  . Pain of right lower extremity 06/10/2015  . Malnutrition of moderate degree (HCC) 05/29/2015  . Femoral-tibial bypass graft occlusion, right (HCC) 05/27/2015  . PAD (peripheral artery disease) (HCC) 05/12/2015  . Type II diabetes mellitus with complication, uncontrolled (HCC) 05/08/2015  . Abnormal nuclear stress test 05/06/2015  . CAD (coronary artery disease) of artery bypass graft 04/29/2015  . Hyperlipidemia 04/29/2015  . Essential hypertension 04/29/2015  . Tobacco abuse 04/29/2015  . Critical lower limb ischemia 03/31/2015  . Atherosclerosis of native arteries of extremity with rest pain (HCC) 03/30/2015    Past Surgical History:  Procedure Laterality Date  . ABDOMINAL AORTAGRAM N/A 03/30/2015   Procedure: ABDOMINAL [name];  Surgeon: Fransisco Hertz, MD;  Location: [facility] CATH LAB;  Service: Cardiovascular;  Laterality: N/A;  . AMPUTATION Right 07/08/2015   Procedure: AMPUTATION BELOW RIGHT KNEE;  Surgeon: Nadara Mustard, MD;  Location: [facility] OR;  Service: Orthopedics;  Laterality: Right;  . BELOW KNEE LEG AMPUTATION Right 07/08/15   Dr. Lajoyce Corners  . CARDIAC CATHETERIZATION N/A 05/06/2015  Procedure: Left Heart Cath and Cors/Grafts Angiography;  Surgeon: Tonny Bollman, MD;  Location: [facility] INVASIVE CV LAB;  Service: Cardiovascular;  Laterality: N/A;  . COLONOSCOPY    . CORONARY ARTERY BYPASS GRAFT  2011   Three vessel by report  . ENDARTERECTOMY FEMORAL Right 05/13/2015   Procedure: RIGHT COMMON FEMORAL, SUPERFICIAL FEMORAL AND PROFUNDA ENDARTERECTOMY ;  Surgeon: Pryor Ochoa, MD;  Location: [facility] OR;  Service: Vascular;  Laterality: Right;  . FEMORAL-TIBIAL  BYPASS GRAFT Right 05/13/2015   Procedure: BYPASS GRAFT RIGHT FEMORAL-POSTERIOR TIBIAL ARTERY USING LEFT NONREVERSED TRANSLOCATED GREATER SAPPHENOUS VEIN;  Surgeon: Pryor Ochoa, MD;  Location: [facility] OR;  Service: Vascular;  Laterality: Right;  . FEMORAL-TIBIAL BYPASS GRAFT Right 05/27/2015   Procedure: THROMBECTOMY OF RIGHT FEMORAL-POSTERIOR TIBIAL ARTERY SAPHENOUS VEIN BYPASS GRAFT ;  Surgeon: Pryor Ochoa, MD;  Location: [facility] OR;  Service: Vascular;  Laterality: Right;  . INTRAOPERATIVE ARTERIOGRAM Right 05/13/2015   Procedure: RIGHT LOWER LEG INTRA OPERATIVE ARTERIOGRAM;  Surgeon: Pryor Ochoa, MD;  Location: [facility] OR;  Service: Vascular;  Laterality: Right;  . INTRAOPERATIVE ARTERIOGRAM Right 05/27/2015   Procedure: INTRA OPERATIVE ARTERIOGRAM;  Surgeon: Pryor Ochoa, MD;  Location: [facility] OR;  Service: Vascular;  Laterality: Right;  . LOWER EXTREMITY ANGIOGRAM N/A 03/30/2015   Procedure: LOWER EXTREMITY ANGIOGRAM;  Surgeon: Fransisco Hertz, MD;  Location: [facility] CATH LAB;  Service: Cardiovascular;  Laterality: N/A;  . PATCH ANGIOPLASTY Right 05/13/2015   Procedure: RIGHT FEMORAL ARTERY PATCH ANGIOPLASTY;  Surgeon: Pryor Ochoa, MD;  Location: [facility] OR;  Service: Vascular;  Laterality: Right;  . STUMP REVISION Right 08/28/2015   Procedure: Revision Right Below Knee Amputation;  Surgeon: Nadara Mustard, MD;  Location: [facility] OR;  Service: Orthopedics;  Laterality: Right;  . STUMP REVISION Right 10/30/2015   Procedure: Revision Right Below Knee Amputation;  Surgeon: Nadara Mustard, MD;  Location: [facility] OR;  Service: Orthopedics;  Laterality: Right;  . THROMBECTOMY FEMORAL ARTERY Right 05/13/2015   Procedure: THROMBECTOMY RIGHT FEMORAL-PROXIMAL POSTERIOR TIBAIL ARTERY BYPASS GRAFT;  Surgeon: Larina Earthly, MD;  Location: [facility] OR;  Service: Vascular;  Laterality: Right;  . VEIN HARVEST Left 05/13/2015   Procedure: LEFT GREATER SAPPHENOUS VEIN HARVEST;  Surgeon: Pryor Ochoa, MD;  Location: [facility] OR;  Service: Vascular;  Laterality:  Left;       Home Medications    Prior to Admission medications   Medication Sig Start Date End Date Taking? Authorizing Provider  aspirin EC 81 MG tablet Take 81 mg by mouth daily.   Yes Historical Provider, MD  atorvastatin (LIPITOR) 40 MG tablet Take 40 mg by mouth daily at 6 PM.  02/10/17  Yes Historical Provider, MD  gabapentin (NEURONTIN) 300 MG capsule Take 300 mg by mouth 3 (three) times daily.    Yes Historical Provider, MD  glimepiride (AMARYL) 2 MG tablet TAKE 1 TABLET BY MOUTH DAILY WITH BREAKFAST. 02/08/17  Yes Elenora Gamma, MD  lisinopril (PRINIVIL,ZESTRIL) 5 MG tablet TAKE 1 TABLET BY MOUTH DAILY 02/08/17  Yes Elenora Gamma, MD  metFORMIN (GLUCOPHAGE) 1000 MG tablet Take 1 tablet (1,000 mg total) by mouth 2 (two) times daily with a meal. 04/15/16  Yes Elenora Gamma, MD  warfarin (COUMADIN) 5 MG tablet TAKE 2 TABLETS (10 MG TOTAL) BY MOUTH DAILY WITH SUPPER. OR AS INSTRUCTED BY ANTICOAGULATION CLINIC. 01/18/16  Yes Elenora Gamma, MD  HYDROcodone-acetaminophen (NORCO/VICODIN) 5-325 MG tablet Take 1 tablet by mouth every 4 (four) hours as  needed. 03/04/17   Jacalyn Lefevre, MD  sulfamethoxazole-trimethoprim (BACTRIM DS,SEPTRA DS) 800-160 MG tablet Take 1 tablet by mouth 2 (two) times daily. 03/04/17 03/11/17  Jacalyn Lefevre, MD    Family History Family History  Problem Relation Age of Onset  . Cancer Father     Social History Social History  Substance Use Topics  . Smoking status: Light Tobacco Smoker    Packs/day: 0.50    Years: 15.00    Types: Cigarettes    Start date: 08/24/1975  . Smokeless tobacco: Never Used  . Alcohol use No     Allergies   Fish-derived products and Penicillins   Review of Systems Review of Systems  Constitutional: Negative for fever.  Musculoskeletal: Positive for arthralgias.       +swelling to LLE  Skin: Positive for color change.  All other systems reviewed and are negative.  Physical Exam Updated Vital Signs BP (!)  174/70 (BP Location: Left Arm)   Pulse (!) 114   Temp 98.2 F (36.8 C) (Oral)   Resp 18   Ht 5\' 10"  (1.778 m)   Wt 182 lb (82.6 kg)   SpO2 98%   BMI 26.11 kg/m   Physical Exam  Constitutional: He is oriented to person, place, and time. He appears well-developed and well-nourished.  HENT:  Head: Normocephalic and atraumatic.  Eyes: EOM are normal.  Neck: Normal range of motion.  Cardiovascular: Normal rate, regular rhythm, normal heart sounds and intact distal pulses.   Pulmonary/Chest: Effort normal. No respiratory distress. He has wheezes.  Wheezing noted in lungs.   Abdominal: Soft. He exhibits no distension. There is no tenderness.  Musculoskeletal: Normal range of motion.  Right BKA.   LLE with chronic skin changes as well as nonhealing ulcer with cellulitis. Decreased palpable pulses in LLE.  Pulses are dopplerable.  Neurological: He is alert and oriented to person, place, and time.  Skin: Skin is warm and dry.  Psychiatric: He has a normal mood and affect. Judgment normal.  Nursing note and vitals reviewed.  ED Treatments / Results  DIAGNOSTIC STUDIES:  Oxygen Saturation is 98% on RA, normal by my interpretation.    COORDINATION OF CARE:  12:32 PM Discussed treatment plan with pt at bedside and pt agreed to plan.  Labs (all labs ordered are listed, but only abnormal results are displayed) Labs Reviewed  BASIC METABOLIC PANEL - Abnormal; Notable for the following:       Result Value   CO2 21 (*)    Glucose, Bld 174 (*)    BUN 34 (*)    Creatinine, Ser 1.53 (*)    GFR calc non Af Amer 50 (*)    GFR calc Af Amer 58 (*)    All other components within normal limits  TROPONIN I - Abnormal; Notable for the following:    Troponin I 0.29 (*)    All other components within normal limits  I-STAT CG4 LACTIC ACID, ED - Abnormal; Notable for the following:    Lactic Acid, Venous 4.22 (*)    All other components within normal limits  CBC WITH DIFFERENTIAL/PLATELET    PROTIME-INR  I-STAT CG4 LACTIC ACID, ED    EKG  EKG Interpretation None     EKG not coming through on MUSE.    Hr 82.  NSR.  T wave inv inf leads III, aVF (new from 07/14/16)  Radiology Ct Angio Aortobifemoral W And/or Wo Contrast  Result Date: 03/04/2017 CLINICAL DATA:  56 year old with redness, swelling  and open blisters in left lower extremity for 1 year. History of peripheral arterial disease and diabetes. History of right below the knee amputation. EXAM: CT ANGIOGRAPHY OF ABDOMINAL AORTA WITH ILIOFEMORAL RUNOFF TECHNIQUE: Multidetector CT imaging of the abdomen, pelvis and lower extremities was performed using the standard protocol during bolus administration of intravenous contrast. Multiplanar CT image reconstructions and MIPs were obtained to evaluate the vascular anatomy. CONTRAST:  150 mL Isovue 370 COMPARISON:  03/23/2015 FINDINGS: VASCULAR Aorta: Atherosclerotic disease in the abdominal aorta without aneurysm. Extensive mural thrombus in the infrarenal abdominal aorta without critical stenosis. 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 Separate origins of the splenic artery, common hepatic artery and left gastric artery from the aorta. Significant atherosclerotic disease involving the distal common hepatic artery. SMA: SMA is patent with mild atherosclerotic disease. Renals: Single renal arteries bilaterally. Mixed plaque in the proximal left renal artery with at least mild stenosis. Focal plaque in the mid right renal artery with moderate stenosis in this area. IMA: IMA is patent. RIGHT Lower Extremity Inflow: Mixed plaque in the right common iliac artery with mild stenosis. Diffuse atherosclerotic disease involving the right internal iliac artery. At least mild stenosis at the origin of the right external iliac artery. Postsurgical changes at the right common femoral artery probably related to an endarterectomy. Outflow: There is flow in the deep right femoral arteries but there is  extensive disease involving the right femoral arteries. The proximal right SFA is occluded. No flow identified in the SFA. No significant flow in the right popliteal artery. The visualized proximal and tibial arteries are heavily calcified. Runoff: Below the knee amputation. LEFT Lower Extremity Inflow: Diffuse disease throughout the left common iliac artery with at least mild stenosis. Diffuse disease involving the left internal and external iliac arteries. Focal stenosis at the origin of the left external iliac artery which may be hemodynamically significant. Large chronic calcified plaque at the left common femoral artery with at least mild stenosis. Outflow: There is flow in the deep left femoral arteries. Severe stenosis in the left SFA just beyond the origin. Segmental areas of stenosis and disease throughout the left SFA. There appears to be a critical stenosis near the left adductor canal. Diffuse disease in the left popliteal artery with occlusion above the knee. The occlusion measures 5-6 cm. Reconstitution of the popliteal artery just above the knee joint. Diffuse disease in the distal popliteal artery. Runoff: Left anterior tibial artery is occluded. 2 vessel runoff from the peroneal artery and posterior tibial artery. Posterior tibial artery is patent at the ankle. Difficult to exclude proximal stenosis of the posterior tibial artery. Veins: No obvious venous abnormality within the limitations of this arterial phase study. Review of the MIP images confirms the above findings. NON-VASCULAR Lower chest: Again noted are few small cystic structures in the lower lobes. No pleural effusions. Stable partially calcified pleural-based nodule in left lower lobe measures 6 mm. Stable 4 mm nodule in the right middle lobe. Hepatobiliary: Calcified gallstones without surrounding inflammatory changes. Normal appearance of the liver. Pancreas: Normal appearance of the pancreas without inflammation or duct dilatation.  Spleen: Normal appearance of spleen without enlargement. Adrenals/Urinary Tract: Mild fullness of left adrenal gland is stable. Normal appearance of the right adrenal gland. Normal appearance of the urinary bladder. Normal appearance of both kidneys. Stomach/Bowel: Stomach is within normal limits. Appendix appears normal. No evidence of bowel wall thickening, distention, or inflammatory changes. Lymphatic: No significant lymph node enlargement in the abdomen or  pelvis. Reproductive: Prostate is unremarkable. Other: No free fluid.  No free air. Musculoskeletal: No acute bone abnormality. IMPRESSION: VASCULAR Diffuse atherosclerotic disease. Inflow disease bilaterally. Focal narrowing at the origin of the left external iliac artery which could be hemodynamically significant. Findings are similar to the previous examination. Extensive outflow disease in the left lower extremity with segmental areas of significant stenosis in the left SFA. Again noted is a focal occlusion in the left popliteal artery above the knee. These vascular findings are similar to the previous examination. Two vessel runoff in the left lower extremity. Postsurgical changes in the right groin. Status post right below-the-knee amputation. NON-VASCULAR Cholelithiasis. No acute abnormality in the abdomen or pelvis. Stable small pulmonary nodules. Electronically Signed   By: Richarda Overlie M.D.   On: 03/04/2017 16:12    Procedures Procedures (including critical care time)  Medications Ordered in ED Medications  morphine 4 MG/ML injection 4 mg (4 mg Intravenous Given 03/04/17 1335)  ondansetron (ZOFRAN) injection 4 mg (4 mg Intravenous Given 03/04/17 1335)  vancomycin (VANCOCIN) IVPB 1000 mg/200 mL premix (0 mg Intravenous Stopped 03/04/17 1445)  iopamidol (ISOVUE-370) 76 % injection 150 mL (150 mLs Intravenous Contrast Given 03/04/17 1454)  sodium chloride 0.9 % bolus 1,000 mL (0 mLs Intravenous Stopped 03/04/17 1632)     Initial Impression /  Assessment and Plan / ED Course  I have reviewed the triage vital signs and the nursing notes.  Pertinent labs & imaging results that were available during my care of the patient were reviewed by me and considered in my medical decision making (see chart for details).   Lactic acid has decreased with IVFs.  The pt's troponin is positive, but he denies CP.  He has a significant coronary artery hx.    His CT angio for his leg shows severe PAD, but it all looks old.    INR was not elevated, so I suspect pt has not been compliant with his medications.  He has not been compliant with his doctor follow ups.   I recommended admission for patient as he has significant medical issues, elevated troponin, severe PAD in the left leg, nonhealing wounds with cellulitis.  Unfortunately, the pt refused admission.  He said that he has a 80 and a 55 year old at home and can't leave them overnight.  He said there is no one else that can watch them.  He is told that he could be having a heart attack and he could die.  He still wants to leave.  He knows that his left leg is very serious and there is not good blood flow to that leg and that he could also lose that leg.  He still wants to leave.  He is encouraged to stop smoking.  He said he smokes to help the pain.  He said he will try to cut down.  He will have to sign AMA.  The pt is encouraged to return at any time.  He is told to f/u with his cardiologist and with vascular and with his pcp.  I personally performed the services described in this documentation, which was scribed in my presence. The recorded information has been reviewed and is accurate.   Final Clinical Impressions(s) / ED Diagnoses   Final diagnoses:  Elevated troponin  PAD (peripheral artery disease) (HCC)  Cellulitis of left lower extremity  Nonhealing nonsurgical wound  Tobacco abuse    New Prescriptions Discharge Medication List as of 03/04/2017  4:27 PM  START taking these medications    Details  HYDROcodone-acetaminophen (NORCO/VICODIN) 5-325 MG tablet Take 1 tablet by mouth every 4 (four) hours as needed., Starting Sat 03/04/2017, Print    sulfamethoxazole-trimethoprim (BACTRIM DS,SEPTRA DS) 800-160 MG tablet Take 1 tablet by mouth 2 (two) times daily., Starting Sat 03/04/2017, Until Sat 03/11/2017, Print         Jacalyn LefevreJulie Genella Bas, MD 03/04/17 1640

## 2017-03-04 NOTE — ED Triage Notes (Signed)
Pt reports having recurring redness, swelling, and pain to left leg.  States he has had several blisters open on this leg and they will not seem to heal.

## 2017-03-04 NOTE — ED Notes (Addendum)
Ct aware pt is ready.

## 2017-03-04 NOTE — ED Notes (Signed)
CRITICAL VALUE ALERT  Critical value received:  Troponin 0.29  Date of notification:  03/04/2017  Time of notification:  1415  Critical value read back:  Yes  Nurse who received alert:  Sophronia SimasJackie Dulcey Riederer  MD notified (1st page):  Dr. Particia NearingHaviland  Time of first page:  1415  Responding MD:  Dr. Particia NearingHaviland  Time MD responded:  [phone]

## 2017-03-04 NOTE — Discharge Instructions (Signed)
Please return at any time.  It is extremely important that you follow up with your cardiologist.  You also need to see the vascular surgeon.  You need to stop smoking.

## 2017-03-04 NOTE — ED Notes (Signed)
Pt refusing to stay after edp giving all risks of leaving. Pt polite. Pt left AMA with resources given.

## 2017-03-09 ENCOUNTER — Other Ambulatory Visit: Payer: Self-pay | Admitting: Family Medicine

## 2017-04-13 ENCOUNTER — Other Ambulatory Visit: Payer: Self-pay | Admitting: Family Medicine

## 2017-05-03 ENCOUNTER — Other Ambulatory Visit: Payer: Self-pay | Admitting: Family Medicine

## 2017-05-16 ENCOUNTER — Other Ambulatory Visit: Payer: Self-pay | Admitting: Family Medicine

## 2017-05-30 ENCOUNTER — Other Ambulatory Visit: Payer: Self-pay | Admitting: Family Medicine

## 2017-06-15 ENCOUNTER — Other Ambulatory Visit: Payer: Self-pay | Admitting: *Deleted

## 2017-06-15 NOTE — Patient Outreach (Signed)
[facility] Care Management  06/15/2017  Hector Green 12-Dec-1961 [id number]  Referral via MD office for coordination of care & evaluation for home safety:  Telephone call to patient who was advised of reason for call & [facility]. Patient states unable to talk at this time but will be available later today.  Return call to patient. HIPPA verification received. Patient states major health conditions includes below knee amputation on right in 2015. DM, high cholesterol.,  chronic pain to left leg. Other listed medical conditions in history are noted as PAD, CAD triple bypass 2013), HTN,   Patient voices that he has prosthesis but currently not wearing because he has pain in stump when he wears it. States currently using walker to get around. Voices he has chronic pain in left lower leg with intermittent throbbing. States he has fallen 10 times in last year.   States currently on waiting list for pain management. Currently taking gabapentin.  Patient states DM is under control. He is able to give Hgb A1c level & what goal is. States checking blood sugar twice daily but could give normal values. States has not taken in DM classes. States diagnosed about 20 years ago.  States he is able to get medications without problems & manages his own medications. States he is taking as prescribed. States is getting medications delivered to his home from pharmacy.   Patient currently does not have a car but states is able to drive & has license. States friend takes him to MD appointment.  Patient currently is asking for assistance with transportation. MD referral requesting assistance with care coordination & assessment of home safety.  [facility] services for care coordination, disease management, community resources, falls safety   [facility] & social work referrals.   Plan:  Send to care management assistant to [facility] &  Clinical care coordinator  Hector CanLinda Jeter Tomey, RN BSN CCM Care Management Coordinator [facility]  [phone] .

## 2017-06-16 IMAGING — CT CT ANGIO AOBIFEM WO/W CM
2 of 9 series · 12 of 46 positions shown, 15 images · IV contrast (Isovue)
Comparison: 03/23/2015

CLINICAL DATA: 55-year-old with redness, swelling and open blisters
in left lower extremity for 1 year. History of peripheral arterial
disease and diabetes. History of right below the knee amputation.

EXAM:
CT ANGIOGRAPHY OF ABDOMINAL AORTA WITH ILIOFEMORAL RUNOFF
TECHNIQUE: Multidetector CT imaging of the abdomen, pelvis and lower
extremities was performed using the standard protocol during bolus
administration of intravenous contrast. Multiplanar CT image
reconstructions and MIPs were obtained to evaluate the vascular
anatomy.
CONTRAST:  150 mL Isovue 370

[Series 4: aobifem axial arterial · axial · arterial · 0.91mm/px · z∈[-1247,-5]mm · 11 of 459 slices shown, 13 images]
[im 30/459  soft-tissue]
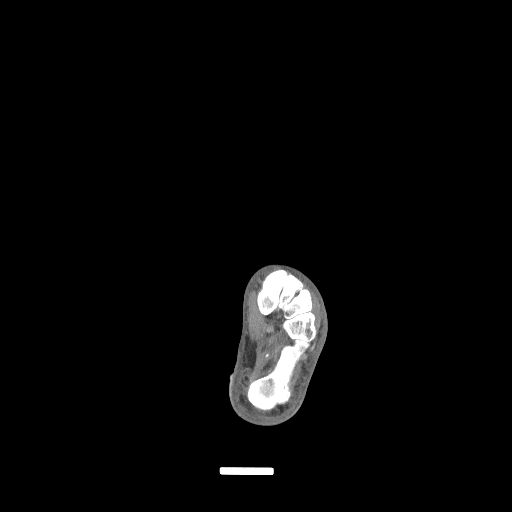
[im 30/459  bone]
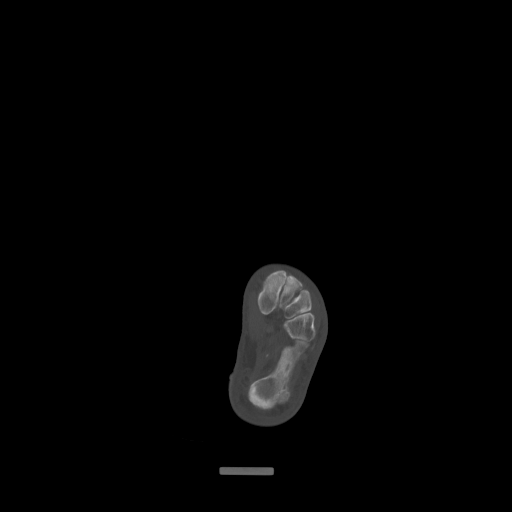
[im 89/459  soft-tissue]
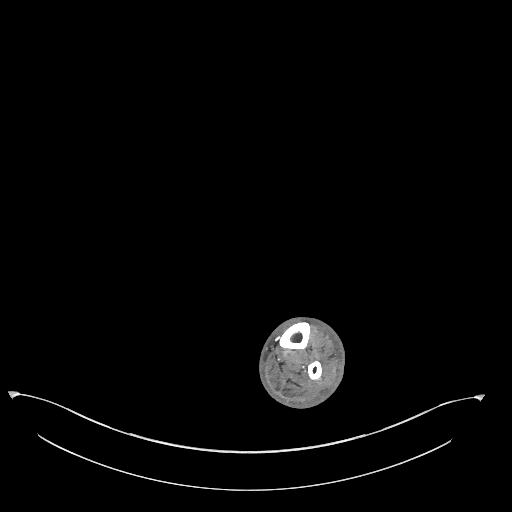
[im 148/459  soft-tissue]
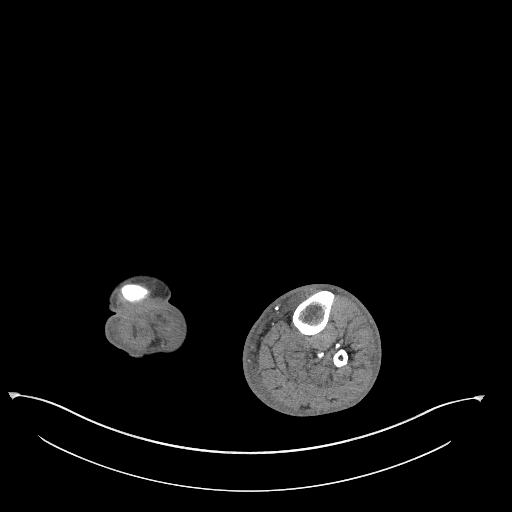
[im 207/459  soft-tissue]
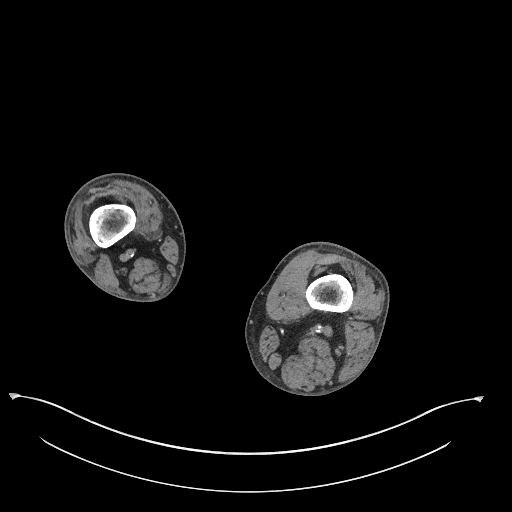
[im 252/459  soft-tissue]
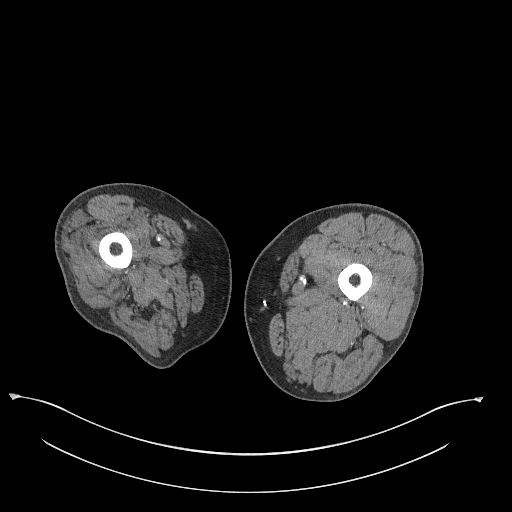
[im 311/459  soft-tissue]
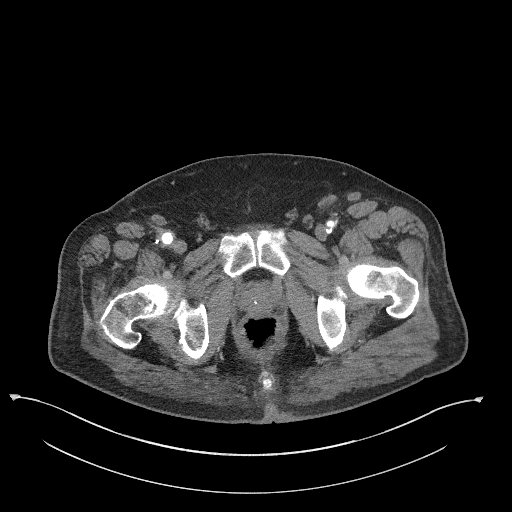
[im 370/459  soft-tissue]
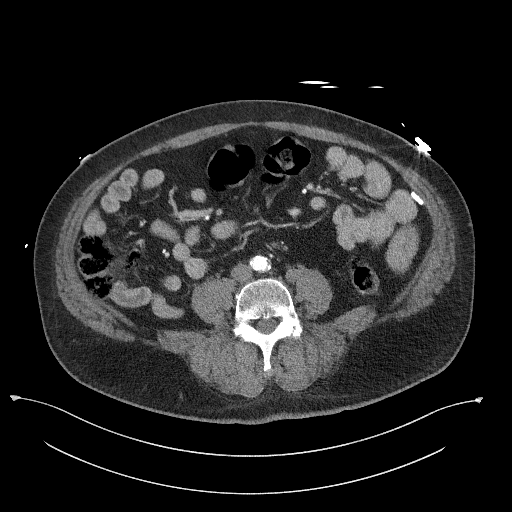
[im 399/459  lung]
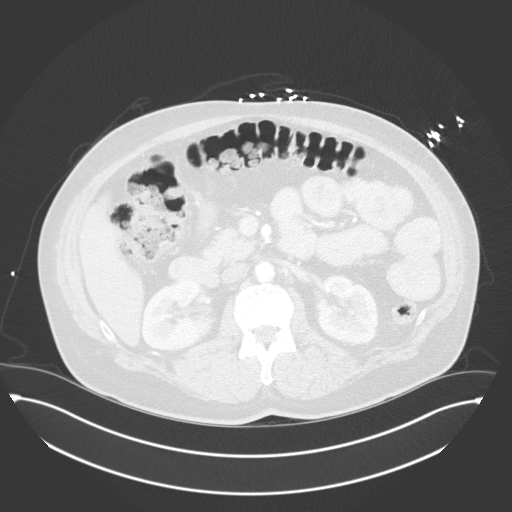
[im 414/459  lung]
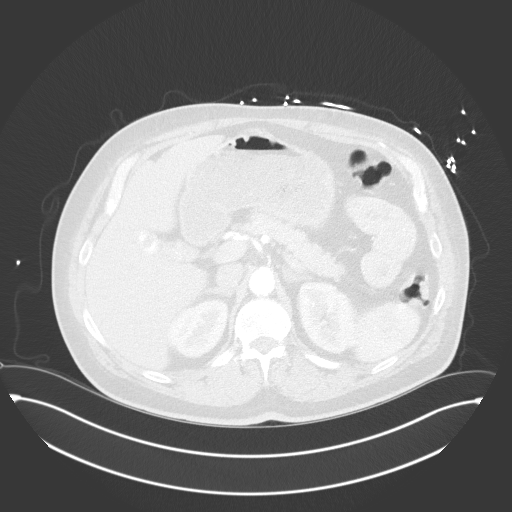
[im 429/459  soft-tissue]
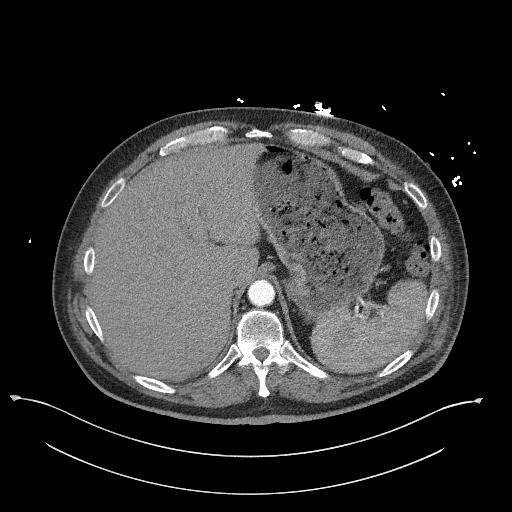
[im 429/459  lung]
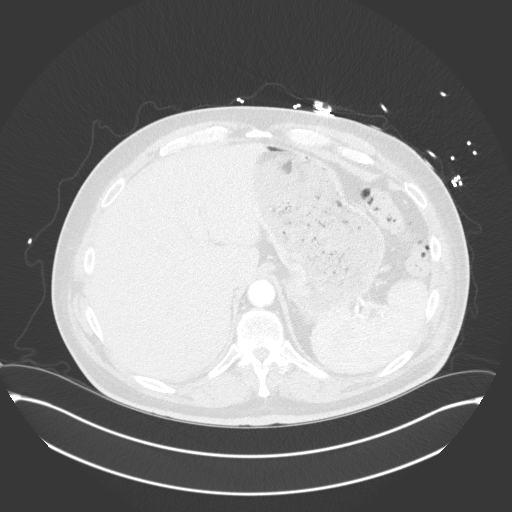
[im 444/459  lung]
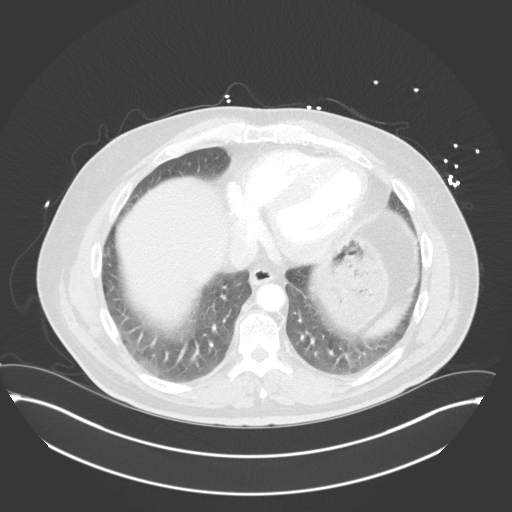

[Series 7: coronal upper · coronal · 0.75mm/px · 1 of 151 slices shown, 2 images]
[im 76/151  soft-tissue]
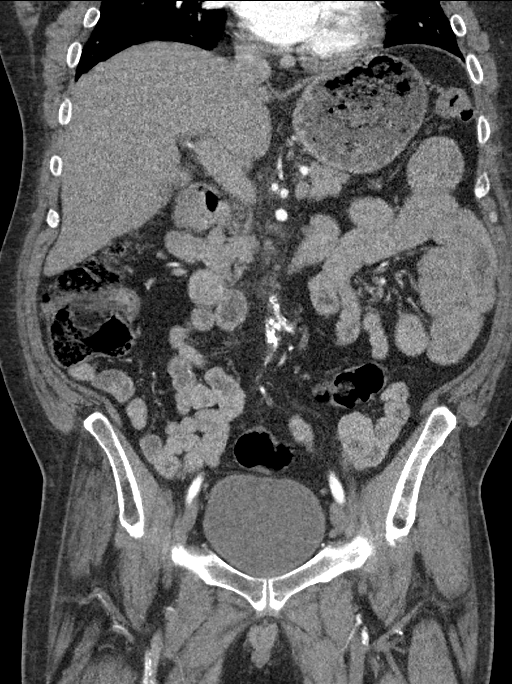
[im 76/151  bone]
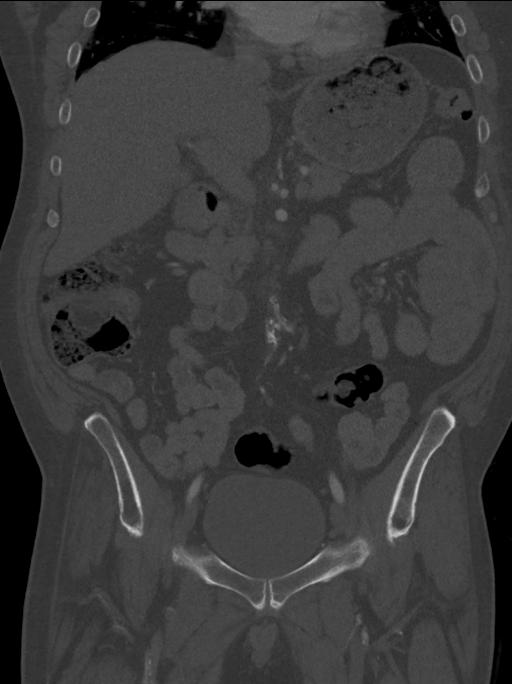

[12 of 46 positions shown; findings below may reference images not displayed]

FINDINGS: VASCULAR

Aorta: Atherosclerotic disease in the abdominal aorta without
aneurysm. Extensive mural thrombus in the infrarenal abdominal aorta
without critical stenosis.

Celiac: There is not a true celiac trunk. Separate origins of the
splenic artery, common hepatic artery and left gastric artery from
the aorta. Significant atherosclerotic disease involving the distal
common hepatic artery.

SMA: SMA is patent with mild atherosclerotic disease.

Renals: Single renal arteries bilaterally. Mixed plaque in the
proximal left renal artery with at least mild stenosis. Focal plaque
in the mid right renal artery with moderate stenosis in this area.

IMA: IMA is patent.

RIGHT Lower Extremity

Inflow: Mixed plaque in the right common iliac artery with mild
stenosis. Diffuse atherosclerotic disease involving the right
internal iliac artery. At least mild stenosis at the origin of the
right external iliac artery. Postsurgical changes at the right
common femoral artery probably related to an endarterectomy.

Outflow: There is flow in the deep right femoral arteries but there
is extensive disease involving the right femoral arteries. The
proximal right SFA is occluded. No flow identified in the SFA. No
significant flow in the right popliteal artery. The visualized
proximal and tibial arteries are heavily calcified.

Runoff: Below the knee amputation.

LEFT Lower Extremity

Inflow: Diffuse disease throughout the left common iliac artery with
at least mild stenosis. Diffuse disease involving the left internal
and external iliac arteries. Focal stenosis at the origin of the
left external iliac artery which may be hemodynamically significant.
Large chronic calcified plaque at the left common femoral artery
with at least mild stenosis.

Outflow: There is flow in the deep left femoral arteries. Severe
stenosis in the left SFA just beyond the origin. Segmental areas of
stenosis and disease throughout the left SFA. There appears to be a
critical stenosis near the left adductor canal. Diffuse disease in
the left popliteal artery with occlusion above the knee. The
occlusion measures 5-6 cm. Reconstitution of the popliteal artery
just above the knee joint. Diffuse disease in the distal popliteal
artery.

Runoff: Left anterior tibial artery is occluded. 2 vessel runoff
from the peroneal artery and posterior tibial artery. Posterior
tibial artery is patent at the ankle. Difficult to exclude proximal
stenosis of the posterior tibial artery.

Veins: No obvious venous abnormality within the limitations of this
arterial phase study.

Review of the MIP images confirms the above findings.

NON-VASCULAR

Lower chest: Again noted are few small cystic structures in the
lower lobes. No pleural effusions. Stable partially calcified
pleural-based nodule in left lower lobe measures 6 mm. Stable 4 mm
nodule in the right middle lobe.

Hepatobiliary: Calcified gallstones without surrounding inflammatory
changes. Normal appearance of the liver.

Pancreas: Normal appearance of the pancreas without inflammation or
duct dilatation.

Spleen: Normal appearance of spleen without enlargement.

Adrenals/Urinary Tract: Mild fullness of left adrenal gland is
stable. Normal appearance of the right adrenal gland. Normal
appearance of the urinary bladder. Normal appearance of both
kidneys.

Stomach/Bowel: Stomach is within normal limits. Appendix appears
normal. No evidence of bowel wall thickening, distention, or
inflammatory changes.

Lymphatic: No significant lymph node enlargement in the abdomen or
pelvis.

Reproductive: Prostate is unremarkable.

Other: No free fluid.  No free air.

Musculoskeletal: No acute bone abnormality.
IMPRESSION: VASCULAR

Diffuse atherosclerotic disease.

Inflow disease bilaterally. Focal narrowing at the origin of the
left external iliac artery which could be hemodynamically
significant. Findings are similar to the previous examination.

Extensive outflow disease in the left lower extremity with segmental
areas of significant stenosis in the left SFA. Again noted is a
focal occlusion in the left popliteal artery above the knee. These
vascular findings are similar to the previous examination.

Two vessel runoff in the left lower extremity.

Postsurgical changes in the right groin. Status post right
below-the-knee amputation.

NON-VASCULAR

Cholelithiasis.

No acute abnormality in the abdomen or pelvis.

Stable small pulmonary nodules.

## 2017-06-19 ENCOUNTER — Other Ambulatory Visit: Payer: Self-pay | Admitting: *Deleted

## 2017-06-19 NOTE — Patient Outreach (Addendum)
[facility]) Care Management  06/19/2017  Hector SalmonJohn W Green Oct 20, 1961 [id number]  I reached out to Hector Green today in response to a referral from our telephonic case management team and Dr. Dwana MelenaZack Green. Hector Green is very interested in care management services and is particularly interested in assistance with fall risk reduction.   Plan: I will see Hector Green at his home on Monday 06/26/17 @ 1pm.    Hector Green MHA,BSN,RN,CCM [facility]  [phone]

## 2017-06-23 ENCOUNTER — Other Ambulatory Visit: Payer: Self-pay | Admitting: *Deleted

## 2017-06-23 NOTE — Patient Outreach (Signed)
[facility]) Care Management  06/23/2017  Chales SalmonJohn W Athey [birth year] [id number]   CSW [facility], Colleen CanLinda Manning for community resources/transportation assistance. CSW called & spoke with patient to schedule initial home visit for next Thursday, July 19th at 1pm. Full assessment & note to follow.    [name] MaxinKelly Brannon Decaire, LCSW [facility]  Clinical Social Worker cell #: [phone]

## 2017-06-26 ENCOUNTER — Other Ambulatory Visit: Payer: Self-pay | Admitting: *Deleted

## 2017-06-26 NOTE — Patient Outreach (Signed)
[facility]) Care Management   06/26/2017  YVES FODOR Oct 08, 1961 [id number]  Hector Green is an 56 y.o. male who was referred to [place] [facility] by Dr. Wende Neighbors  for care coordination and home safety evaluation. Mr. 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  Subjective: "I don't have anything. I was dumped off here by my son's mother"  Objective:  BP (!) 150/88   Pulse 99   Ht 1.778 m (5' 10" )   Wt 179 lb (81.2 kg)   SpO2 98%   BMI 25.68 kg/m   Review of Systems  Constitutional: Negative.   HENT: Negative.   Eyes: Negative.   Respiratory: Negative.   Cardiovascular: Negative for chest pain, palpitations and leg swelling.  Gastrointestinal: Negative.   Genitourinary: Negative.   Musculoskeletal: Positive for falls and myalgias.  Skin:       Skin on RLE pink to reddish; patient states this is baseline  Neurological: Negative.   Psychiatric/Behavioral: Negative.     Physical Exam  Constitutional: He is oriented to person, place, and time. Vital signs are normal. He appears well-developed. He is active. He does not have a sickly appearance. He does not appear ill.  Cardiovascular: Normal rate and regular rhythm.   No murmur heard. Respiratory: Effort normal and breath sounds normal. He has no wheezes. He has no rhonchi. He has no rales.  GI: Soft. Bowel sounds are normal. He exhibits no distension. There is no tenderness.  Neurological: He is alert and oriented to person, place, and time.  Skin: Skin is warm, dry and intact. No rash noted.  Psychiatric: He has a normal mood and affect. His speech is normal and behavior is normal. Judgment and thought content normal. Cognition and memory are normal.    Encounter  Medications:   Outpatient Encounter Prescriptions as of 06/26/2017  Medication Sig  . aspirin EC 81 MG tablet Take 81 mg by mouth daily.  Marland Kitchen atorvastatin (LIPITOR) 40 MG tablet Take 40 mg by mouth daily at 6 PM.   . clobetasol cream (TEMOVATE) 7.89 % Apply 1 application topically 2 (two) times daily.  Marland Kitchen gabapentin (NEURONTIN) 300 MG capsule Take 300 mg by mouth 3 (three) times daily.   Marland Kitchen glimepiride (AMARYL) 2 MG tablet TAKE 1 TABLET BY MOUTH DAILY WITH BREAKFAST.  Marland Kitchen lisinopril (PRINIVIL,ZESTRIL) 5 MG tablet TAKE 1 TABLET BY MOUTH DAILY  . metFORMIN (GLUCOPHAGE) 1000 MG tablet Take 1 tablet (1,000 mg total) by mouth 2 (two) times daily with a meal.  . HYDROcodone-acetaminophen (NORCO/VICODIN) 5-325 MG tablet Take 1 tablet by mouth every 4 (four) hours as needed. (Patient not taking: Reported on 06/26/2017)  . warfarin (COUMADIN) 5 MG tablet TAKE 2 TABLETS (10 MG TOTAL) BY MOUTH DAILY WITH SUPPER. OR AS INSTRUCTED BY ANTICOAGULATION CLINIC.    Functional Status:   In your present state of health, do you have any difficulty performing the following activities: 06/26/2017 06/15/2017  Hearing? N -  Vision? N N  Difficulty concentrating or making decisions? N N  Walking or climbing stairs? Y Y  Dressing or bathing? N N  Doing errands, shopping? Tempie Donning  Preparing Food and eating ? N -  Using the Toilet? N -  In the past six months, have you accidently  leaked urine? N -  Do you have problems with loss of bowel control? N -  Managing your Medications? N -  Managing your Finances? Y -  Housekeeping or managing your Housekeeping? Y -    Fall/Depression Screening:    Fall Risk  06/26/2017 06/15/2017 04/15/2016  Falls in the past year? Yes Yes Yes  Number falls in past yr: 2 or more 2 or more 2 or more  Injury with Fall? No No No  Risk Factor Category  High Fall Risk High Fall Risk -  Risk for fall due to : History of fall(s);Impaired balance/gait;Impaired mobility History of fall(s) -  Follow up Falls  prevention discussed;Education provided;Falls evaluation completed Falls prevention discussed -   PHQ 2/9 Scores 06/26/2017 06/15/2017 04/15/2016 12/01/2015  PHQ - 2 Score 2 1 0 4  PHQ- 9 Score 8 - - 11    Assessment: Fall Risk/Home Safety Concerns - Mr. Boylen has had 10 falls in the last year. As noted, he is an amputee. He says that he only has his standard rolling walker to use for assistance with mobility. He has a prosthetic leg but was not wearing it today. Mr. Kaupp says he often loses is balance and falls when he has sudden onset of weakness in his left arm or when he has sudden onset of severe pain in his left leg. Because mobility is such a barrier for him, he typically leaves his apartment in the morning and goes down to the communal patio and stays there all day until late afternoon or early evening, to avoid going up and down on the sidewalk or in and out of the apartment. There is only a recliner in his apartment. No other furniture. No rugs. The kitchen floor is linoleum and the remainder of the apartment has brand new carpet.  High Risk Medications - Mr. 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 During our medication review, Mr. Mcgaugh indicated that he sometimes takes more than 3 Norco daily because "it is a prescription and 'as needed' so if I need it a lot more on a certain day I can take it." He is on the waiting list for entry into a pain management clinic in [place].  DME Needs - Mr. Dollar is very interested in acquiring a motorized wheelchair. He lives on the second floor of his apartment complex which has an Media planner. He has the rolling walker with 3 inch wheels but has fallen several times using it. I believe he would benefit from a PT evaluation and recommendations for DME. I can help him thereafter inquire about coverage for the motorized wheelchair.  Transportation Concerns - Mr. Amsden currently does not have a car but states he is able to drive  and has a current [place] license. At this time, an "aide" whom he met at his new apartment complex "who takes care of some other people here" takes him to provider appointments. He didn't have a business card for his aide nor did he know for which agency she worked. He indicated that he wanted to be able to give her money for the transportation/gas but didn't have enough. I asked Mr. Ellsworth to try to get identifying information about his aide so I could collaborate with her regarding transportation resources. Mr. Arenson said he'd heard from other residents at his complex about "some tickets" he could use for [employer] transportation. Our telephonic case manager referred Mr. Edgecombe to our social work team for assistance  with transportation needs. I have reached out to [place], who is scheduled to see Mr. Klingensmith on 06/29/17, to update her about this information.   Chronic Health Condition (DM) - the last known HgA1C I am able to find in the medical record for Mr. Mario is 8.8 which was taken in August of 2017. Mr. Labelle says he had blood drawn at Dr. Juel Burrow office last week and he believes HgA1C was drawn. He told me he was checking his cbg's but couldn't produce a glucose monitor and said he needed a new one. Mr. Winsett does have his prescribed diabetes medications on hand and it appears from pill counts that he is taking them as prescribed.  Food Resources - Mr. Neville is receiving freezer meals through his [facility] care plan. He has several in the freezer but he does not have a microwave or other kitchen items with which he could heat or prepare his meal. (His apartment is completely empty except for a recliner, 1 blanket, and a few items of clothing). Mr. Schreier says he is eating once daily, usually a ham sandwich and water.  I reviewed signs and symptoms of hypoglycemia with Mr. Grenda and we discussed resources for food in the community. I have provided this updated information also to [employer] CSW.   Chronic  Health Condition (HTN) - Mr. Ellingwood's blood pressure today is, per his report, consistent with what is his baseline. He does not check his own blood pressure and does not own a monitor. Mr. Riner is taking his blood pressure medication as prescribed. As noted, he has a medical history which includes hypertension, CAD s/p CABG, and PAD. He has not seen a cardiologist "in a long time". He would like to start seeing a cardiologist locally. I have called [facility] as per Mr. Wagar request to help him establish care with a cardiologist.   Community Resources - Mr. Mccarrick lives literally within [place] of the [employer] (across the street, can be seen from his apartment window). Even though Mr. Zeimet is only 56 years old, he can attend the senior center because of his disability status. I encouraged Mr. Winkleman to consider visiting the senior center and offered to go with him. I am concerned that during the winter months when Mr. Wearing will not be able to sit outside with other residents, he will become socially isolated. Also, the senior center has numerous programs and resources including a lunch program for which Mr. Frith can get on the waiting list.   No Advanced Directives in Place - I provided Mr. Shear with an advanced directives packet and reviewed it with him. He will look over these materials and we will have a follow up discussion on my next visit.   Plan:   Mr. Brandel will meet with [facility] CSW for a home visit on Thursday (transportation, food resources, furnishings for apartment).   Mr. Ruddick will attend any new provider appointments once scheduled ([place]).   I will request an order for a cbg meter and an order for HHPT eval from Dr. Juel Burrow office.   Mr. Voong will review the advanced directives packet I provided for him.   I will follow up with Mr. Bushong by phone regarding a new meter and the PT eval and will see him again at home over the next 2  weeks.   If Mr. Muscatello wishes, I can visit the [place] with him.   [facility] CM Care  Plan Problem One     Most Recent Value  Care Plan Problem One  Home Safety/Fall Risk (equipment needs)  Role Documenting the Problem One  Care Management Coordinator  Care Plan for Problem One  Active  [facility] Long Term Goal   Over the next 31 days, patient will verbalize understanding of plan of care for fall risk reduction and for obtaining needed DME  [facility] Long Term Goal Start Date  06/26/17  Interventions for Problem One Long Term Goal  Falls evaluation completed,  fall risk reduction education provided,  PT eval requested  [facility] CM Short Term Goal #1   Over the next 30 days, patient wil employ fall risk reduction strategies in the home and around his apartment complex  [facility] CM Short Term Goal #1 Start Date  06/26/17  Interventions for Short Term Goal #1  Utilizing teachback method, discussed fall risk reductions strategies,  Emmi educational materials prescribed  [facility] CM Short Term Goal #2   Over the next 14 days, patient will verbalize receipt of call/visit from HHPT for in home evaluation  [facility] CM Short Term Goal #2 Start Date  06/26/17  Interventions for Short Term Goal #2  PT eval order requested from PCP office    [facility] CM Care Plan Problem Two     Most Recent Value  Care Plan Problem Two  Limited Food Resources  Role Documenting the Problem Two  Care Management Coordinator  Care Plan for Problem Two  Active  [facility] CM Short Term Goal #1   Over the next 7 days, patient will verablize understanding of food resource acquisition plan from work with [place]    [facility] CM Care Plan Problem Three     Most Recent Value  Care Plan Problem Three  Knowledge and Resource Deficits for Self Health Management of Chronic Disease (DM and HTN)  Role Documenting the Problem Three  Care Management Coordinator  Care Plan for Problem Three  Active  [facility] Long Term Goal   Over the next 60 days, patient will verbalize undersanding of  basic plan to begin self health management practices related to chronic disease states (DM and HTN)  [facility] Long Term Goal Start Date  06/26/17  Interventions for Problem Three Long Term Goal  Utilizing teachback method, discussion, and prescribed Emmi educational tools, discussed need to employ self health management strategies for chronic disease management  [facility] CM Short Term Goal #1   Over the next 30 days, patient will obtain cbg monitor and begin checking and recording cbg's  [facility] CM Short Term Goal #1 Start Date  06/26/17  Interventions for Short Term Goal #1  requested order for cbg monitor from pcp office to be sent to [facility] CM Short Term Goal #2   Over the next 30 days, patient will obtain blood pressure monitor and begin checking recording bp's  [facility] CM Short Term Goal #2 Start Date  06/26/17  Interventions for Short Term Goal #2  requested approval for blood pressure monitor for patient through [place] [facility]  [phone]

## 2017-06-28 ENCOUNTER — Ambulatory Visit: Payer: PRIVATE HEALTH INSURANCE | Admitting: Family Medicine

## 2017-06-29 ENCOUNTER — Other Ambulatory Visit: Payer: Self-pay | Admitting: *Deleted

## 2017-06-29 ENCOUNTER — Encounter: Payer: Self-pay | Admitting: *Deleted

## 2017-06-29 NOTE — Patient Outreach (Signed)
[facility]) Care Management  [facility] Social Work  06/29/2017  Hector Green [birth year] [id number]   Encounter Medications:  Outpatient Encounter Prescriptions as of 06/29/2017  Medication Sig  . aspirin EC 81 MG tablet Take 81 mg by mouth daily.  Marland Kitchen atorvastatin (LIPITOR) 40 MG tablet Take 40 mg by mouth daily at 6 PM.   . clobetasol cream (TEMOVATE) 3.70 % Apply 1 application topically 2 (two) times daily.  Marland Kitchen gabapentin (NEURONTIN) 300 MG capsule Take 300 mg by mouth 3 (three) times daily.   Marland Kitchen glimepiride (AMARYL) 2 MG tablet TAKE 1 TABLET BY MOUTH DAILY WITH BREAKFAST.  Marland Kitchen HYDROcodone-acetaminophen (NORCO/VICODIN) 5-325 MG tablet Take 1 tablet by mouth every 4 (four) hours as needed. (Patient not taking: Reported on 06/26/2017)  . lisinopril (PRINIVIL,ZESTRIL) 5 MG tablet TAKE 1 TABLET BY MOUTH DAILY  . metFORMIN (GLUCOPHAGE) 1000 MG tablet Take 1 tablet (1,000 mg total) by mouth 2 (two) times daily with a meal.  . warfarin (COUMADIN) 5 MG tablet TAKE 2 TABLETS (10 MG TOTAL) BY MOUTH DAILY WITH SUPPER. OR AS INSTRUCTED BY ANTICOAGULATION CLINIC.   No facility-administered encounter medications on file as of 06/29/2017.     Functional Status:  In your present state of health, do you have any difficulty performing the following activities: 06/29/2017 06/26/2017  Hearing? N N  Vision? N N  Difficulty concentrating or making decisions? N N  Walking or climbing stairs? Y Y  Dressing or bathing? N N  Doing errands, shopping? Y Y  Conservation officer, nature and eating ? - N  Using the Toilet? - N  In the past six months, have you accidently leaked urine? - N  Do you have problems with loss of bowel control? - N  Managing your Medications? - N  Managing your Finances? - Y  Housekeeping or managing your Housekeeping? - Y  Some recent data might be hidden    Fall/Depression Screening:  PHQ 2/9 Scores 06/29/2017 06/26/2017 06/15/2017 04/15/2016 12/01/2015  PHQ - 2 Score 2 2 1  0 4  PHQ- 9  Score 8 8 - - 11    Assessment:   CSW received referral from [place], Sherrin Daisy on 7/6 for assistance with community resources. CSW met with patient today outside of apartment building on the patio where he generally sits all day in the shade and greets other residents as they come and go. Patient informed CSW that he had been living with his 91 year old son's mother and her husband for the past 2 years until they kicked him out 2 1/2 months ago when they moved him into the apartment he is currently living in at [employer]. Patient informed CSW that his rent there is $150/month and that he receives a $60 SSI & $650 SSD check monthly. Patient also informed CSW that his electricity last month was $87, cellphone is $35/month through [place] usually around $50/week, though he goes get $16 in food stamps. Patient currently only has in his 2 bedroom apartment a recliner and clothing. Patient receives frozen meals through [employer], which comes in 10 day supply which he received while CSW was visiting. Patient states that he has been getting the meals since he moved into the apartment and is able to continue to get the meals for up to a year. Patient though is unable to warm them up as he does not have a microwave. Patient does have a refrigerator and stove. He states that he has  been keeping the meals in his freezer in hopes that he can get a microwave soon.   Patient states that there is an aide that he met through another resident at the apartments who helps him with transportation to doctors appointments and he has an appointment coming up Aug 27th at 3pm at the [facility] in [place] and either Kennyth Lose will transport him or he will call [name] spoke with patient about community resources - mainly the [employer] which is across the street from his apartment. Patient states that he has not been there yet and that it is hard for him to get around as he  said it would be too much for him to walk there. 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 CSW will look into whether it would be possible to assist with replacing the gel liner so that he could actually use his prosthesis as it would greatly help his overall quality of life.   Plan:   CSW will collaborate with RNCM, Alisa to determine how to assist patient with obtaining needed furniture & microwave. CSW will follow-up in 1 week.   [facility] CM Care Plan Problem One     Most Recent Value  Care Plan Problem One  Community Resources  Role Documenting the Problem One  Clinical Social Worker  Care Plan for Problem One  Active  [facility]N Long Term Goal   over the next 45 days, patient will be linked with community resources as identified.   THN Long Term Goal Start Date  06/29/17  Interventions for Problem One Long Term Goal  CSW will collaborate with RNCM/other [facility] CSW on resources to connect patient with        Raynaldo Opitz, [facility]  Clinical Social Worker cell #: [phone]

## 2017-06-30 ENCOUNTER — Other Ambulatory Visit: Payer: Self-pay | Admitting: *Deleted

## 2017-06-30 NOTE — Patient Outreach (Signed)
[facility]N) Care Management  06/30/2017  Chales SalmonJohn W Walkins 29-Nov-1961 [id number]  I reached out to Dr. Keane PoliceZack Hall's office to request orders for the following: cbg meter and supplies, gel cushion (fitting) for right leg prosthesis, and motorized wheelchair. I spoke with the representative at [facility] who indicated that [name] would very likely incur out of pocket expense for the chair. I let [name] know that he would be receiving a phone call from [facility] and they would let him know about out of pocket costs. He is to call me once he has heard from [facility]. We are connecting [name] to resources to help him re-establish a liveable dwelling. He currently is living in an empty apartment with his only possessions being a few items of clothing and a recliner chair.   Plan: I will follow up with [name] next week regarding needed DME.    Marja Kayslisa Gilboy MHA,BSN,RN,CCM [facility] Care Management  [phone]

## 2017-07-04 ENCOUNTER — Other Ambulatory Visit: Payer: Self-pay | Admitting: *Deleted

## 2017-07-04 NOTE — Patient Outreach (Signed)
[facility] ([facility]) Care Management  07/04/2017  Chales SalmonJohn W Vandervelden Sep 06, 1961 [id number]  I reached out to [name] this morning to follow up on our visit from last week.   Durable [facility]:  Glucose meter - an order for a glucose meter to be sent to [facility] was requested.  Motorized wheelchair - [name] believes he would benefit from a motorized wheelchair. I requested an order for PT evaluation.   Thus far, [name] has not been contacted by anyone for either delivery of his CBG meter or PT evaluation appointment.   Medication Request  [name] told me today that he has run out of his pain medication. He has a new patient appointment at the [place] management clinic [place] in [place]. He says he called Dr. Scharlene GlossHall's office yesterday leaving a message to request a short term supply of pain medication but has not heard from the office. He now has an appointment with Dr. [name] on Thursday morning.   Plan: I will follow up with Dr. Scharlene GlossHall's office re: DME.    Marja Kayslisa Gilboy MHA,BSN,RN,CCM [facility] Care Management  [phone]

## 2017-07-05 ENCOUNTER — Other Ambulatory Visit: Payer: Self-pay | Admitting: *Deleted

## 2017-07-05 NOTE — Patient Outreach (Signed)
[facility]N) Care Management  07/05/2017  Hector SalmonJohn W Flippen [birth year] [id number]   [name] called earlier today to report that he does not have transportation to his appointment with Dr. [name] tomorrow. Our transportation coordinator has made arrangements for transport tomorrow to Dr. Scharlene GlossHall's office. [name] will be picked up at 8:30am for his 9:10am appointment and he is aware of this arrangement.    Hector Green MHA,BSN,RN,CCM [facility] Care [facility]  [phone]

## 2017-07-06 ENCOUNTER — Ambulatory Visit: Payer: PRIVATE HEALTH INSURANCE | Admitting: *Deleted

## 2017-07-07 ENCOUNTER — Other Ambulatory Visit: Payer: Self-pay | Admitting: *Deleted

## 2017-07-07 NOTE — Patient Outreach (Signed)
[facility]N) Care Management  07/07/2017  Hector Green 06-25-1961 [id number]   CSW called & spoke with patient who informed CSW that he was able to go to his PCP - Dr. [name] yesterday via transportation arranged by [facility] RNCM, Alisa through [employer]. CSW informed patient that CSW will mail tickets for [facility] for when he has appointments in [place]. Patient states that the next appointment that he has is at the end of August at the Pain Management Clinic [place] and that he already has a ride lined up to take him to that appointment. Patient reports that Dr. [name] has prescribed him pain medication to tie him over until the Pain Management appointment at the end of August and was very grateful for that.    [name] MaxinKelly Xan Sparkman, LCSW [facility]  Clinical Social Worker cell #: [phone]

## 2017-07-12 NOTE — Patient Outreach (Signed)
Request received from Kelly Harrison, LCSW to mail patient personal care resources.  Information mailed today. 

## 2017-07-13 ENCOUNTER — Other Ambulatory Visit: Payer: Self-pay | Admitting: *Deleted

## 2017-07-13 NOTE — Patient Outreach (Signed)
[facility]N) Care Management  07/13/2017  Hector SalmonJohn W Green [birth year] [id number]  I reached out to [name] today to find out if he's received a new cbg monitor and he was very confused about a few calls he'd received about the monitor. I will be in his area tomorrow and will stop by for a visit to help him sort out the details.    Marja Kayslisa Gilboy MHA,BSN,RN,CCM [facility] Care Management  [phone]

## 2017-07-14 ENCOUNTER — Other Ambulatory Visit: Payer: Self-pay | Admitting: *Deleted

## 2017-07-14 NOTE — Patient Outreach (Signed)
[facility]) Care Management  07/14/2017  Hector SalmonJohn W Green 1960/12/31 [id number]  I spoke with [name] by phone today as I had to cancel our face to face appointment due to inclement weather (rain) and unsafe road conditions on the route to his home.   DME Needs:   [name] said he received a call from [facility] who told him they would not be able to provide the needed DME items requested (CBG meter, gel cushion for his prosthesis, and motorized wheelchair) because of insurance details. I reached out them and then to the office of Dr. [name] requesting that orders be sent to [facility].   [name] indicated that otherwise, he is doing well and had a good visit with Dr. [name] last week.   Plan: I will see [name] at home on Tuesday, July 25, 2017.    Marja Kayslisa Gilboy MHA,BSN,RN,CCM [facility] Care Management  [phone]

## 2017-07-25 ENCOUNTER — Other Ambulatory Visit: Payer: Self-pay | Admitting: *Deleted

## 2017-07-25 NOTE — Patient Outreach (Signed)
[facility]) Care Management   07/25/2017  Hector Green [birth year] [id number]  Hector Green is an 56 y.o. male who was referred to [facility] Care Management by Hector Green  for care coordination and home safety evaluation. 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   I am seeing Hector Green at home today for our 2nd home visit to continue addressing care coordination, disease education, community resource, and home safety/fall risk reduction needs.   Subjective:   Objective:   Review of Systems  Constitutional: Negative.   HENT: Negative.   Eyes: Negative.   Respiratory: Negative.   Cardiovascular: Negative.  Negative for chest pain and palpitations.  Gastrointestinal: Negative.   Genitourinary: Negative.   Musculoskeletal: Negative.  Negative for falls.  Skin: Negative.   Neurological: Negative.   Psychiatric/Behavioral: Negative.     Physical Exam  Constitutional: He is oriented to person, place, and time. Vital signs are normal. He appears well-developed and well-nourished. He is active. He does not have a sickly appearance. He does not appear ill.  Cardiovascular: Normal rate and regular rhythm.   Respiratory: Effort normal and breath sounds normal. He has no wheezes. He has no rhonchi. He has no rales.  GI: Soft. Bowel sounds are normal. There is no tenderness.  Neurological: He is alert and oriented to person, place, and time.  Skin: Skin is warm, dry and intact.  Psychiatric: He has a normal mood and affect. His speech is normal and behavior is normal. Judgment and thought content normal. Cognition and memory are normal.    Encounter Medications:   Outpatient Encounter Prescriptions as of 07/25/2017  Medication Sig   . aspirin EC 81 MG tablet Take 81 mg by mouth daily.  Marland Kitchen atorvastatin (LIPITOR) 40 MG tablet Take 40 mg by mouth daily at 6 PM.   . clobetasol cream (TEMOVATE) 0.05 % Apply 1 application topically 2 (two) times daily.  Marland Kitchen gabapentin (NEURONTIN) 300 MG capsule Take 300 mg by mouth 3 (three) times daily.   Marland Kitchen glimepiride (AMARYL) 2 MG tablet TAKE 1 TABLET BY MOUTH DAILY WITH BREAKFAST.  Marland Kitchen HYDROcodone-acetaminophen (NORCO/VICODIN) 5-325 MG tablet Take 1 tablet by mouth every 4 (four) hours as needed. (Patient not taking: Reported on 06/26/2017)  . lisinopril (PRINIVIL,ZESTRIL) 5 MG tablet TAKE 1 TABLET BY MOUTH DAILY  . metFORMIN (GLUCOPHAGE) 1000 MG tablet Take 1 tablet (1,000 mg total) by mouth 2 (two) times daily with a meal.  . warfarin (COUMADIN) 5 MG tablet TAKE 2 TABLETS (10 MG TOTAL) BY MOUTH DAILY WITH SUPPER. OR AS INSTRUCTED BY ANTICOAGULATION CLINIC.   Assessment:    Fall Risk/Home Safety Concerns - Hector Green has had 10 falls in the last year. He is an amputee but does not currently have a gel cushion for his prothestic. He has a standard rolling walker but sometimes experiences weakness and fatigue in his left arm.   Hector Green typically leaves his apartment in the morning and goes down to the communal patio and stays there all day until late afternoon or early evening, to avoid going up and down on the sidewalk or in and out of the apartment. He was able to get moved to a floor level, handicapped accessible apartment.   High Risk Medications - Mr.  Katrinka Green is taking gabapentin for peripheral neuropathic pain of the left leg. During our medication review, Hector Green indicated that he sometimes takes more than 3 Norco daily because "it is a prescription and 'as needed' so if I need it a lot more on a certain day I can take it." He is on the waiting list for entry into a pain management clinic in [place].   DME Needs - Hector Green is very interested in acquiring a motorized wheelchair. He has the  rolling walker with 3 inch wheels but has fallen several times using it. I spoke with Hector Green today in the office and orders for a gel cushion and evaluation for a motorized wheelchair were sent to [facility].    Transportation Concerns - Hector Green currently does not have a car but states he is able to drive and has a current [place] license. Our telephonic case manager referred Hector Green to our social work team for assistance with transportation needs and he has been seen by Hector Green.   Chronic Health Condition (DM) - Hector Green received his new CBG meter and we set it up today and reviewed how to perform CBG checks. Hector Green is to check his cbg daily and we will review his findings next week.   We discussed his questions about food choices and other self health management activities to improve management of his diabetes.  Food Resources - Hector Green is receiving freezer meals through [facility]. He has several in the freezer but he does not have a microwave. Someone donated a crock pot to him and he now has a plate and 2 bowls he bought at a yard sale. Hector Green purchased a few canned items and some bread and lunch meat and is now eating 2 meals/daily.    Chronic Health Condition (HTN) - Hector Green is taking his blood pressure medication as prescribed. As noted, he has a medical history which includes hypertension, CAD s/p CABG, and PAD. He has not seen a cardiologist "in a long time". He would like to start seeing a cardiologist locally. I have called [facility] as per Hector Green's request to help him establish care with a cardiologist.   Community Resources - Hector Green lives [place] the [facility]. Even though Hector Green is only 56 years old, he can attend the senior center because of his disability status. I encouraged Hector Green to consider visiting the senior center and offered to go with him. He asked if I would go with him  next week when I come for our home visit. I have e-mailed the program director to notify her of our planned visit.    No Advanced Directives in Place - I provided Hector Green with an advanced directives packet and reviewed it with him.  Plan:   I will collaborate with [facility] re: DME needs.  Hector Green will check his cbg's daily over the next week.  I will see Hector Green at home next week to follow up.    Marja Kayslisa Savina Olshefski MHA,BSN,RN,CCM [facility] Care Management  [phone]

## 2017-07-28 ENCOUNTER — Other Ambulatory Visit: Payer: Self-pay | Admitting: *Deleted

## 2017-07-28 NOTE — Patient Outreach (Signed)
[facility]) Care Management  07/28/2017  ALA DESAUTEL 28-Sep-1961 [id number]  I reached out to Mr. Tougas today to follow up on care coordination items as follows:   DME:   1) Gel Cushion for prosthesis - Dr. Scharlene Gloss office sent a prescription yesterday to [facility] in [place] for a gel cushion for Mr. Ellett's prosthesis. I reached out to [employer] to provide my contact information for collaboration/coordination. They requested to see Mr. Beisler in the office next week on Wednesday at 11am. I notified our social worker [name] Maxin LCSW who is helping Mr. Parrado with transportation assistance for that appointment next week.   2) Motorized Wheelchair - Dr. Scharlene Gloss office provided a hard copy of required documentation and an order for a motorized wheelchair which I hand delivered to [employer]. Per the [employer] member, the person who pre-authorizes motorized wheelchairs will review the information and contact me with details which I can pass along to Mr. Krammes.   Medication Management: Mr. Freidel told me that he is in need of prescription refills on all of his "regular medications" and understood that they would automatically be sent to him monthly. I contacted [facility] who only had prescriptions on hand for Mr. Mares for Gabapentin and Atorvastatin. I reached out to [facility], [facility] in [place], and [facility], none of whom have prescriptions on hand for Mr. Dobias.   I notified the office of Dr. Dwana Melena that Mr. Odoms needs new prescriptions for all routine medications except Gabapentin and Atorvastatin sent to [facility].   Plan: I will see Mr. Hansley at home next week for our scheduled visit.    Marja Kays MHA,BSN,RN,CCM [facility] Care Management  [phone]

## 2017-07-28 NOTE — Patient Outreach (Signed)
[facility]) Care Management  07/28/2017  BECKER CHRISTOPHER 1961-07-20 [id number]   CSW met with patient outside his apartment where he usually spends most of his days. CSW talked with patient about upcoming appointment with [employer] that [place] scheduled. Patient is looking forward to getting a new gel liner and being able to use his prosthesis again. CSW submitted transportation request to [place] for appointment next Wed, 8/22 at [place] also confirmed with patient's PCS aide, Kennyth Lose (ph#: [phone]) that she has setup transportation through [employer] for his appointment with the pain management clinic on 8/27. CSW will communicate back with patient once transport has been arranged for appointment next week.    Raynaldo Opitz, LCSW [facility]  Clinical Social Worker cell #: [phone]

## 2017-08-01 ENCOUNTER — Other Ambulatory Visit: Payer: Self-pay | Admitting: *Deleted

## 2017-08-02 NOTE — Patient Outreach (Signed)
[facility]) Care Management   08/02/2017  Hector Green 15-Nov-1961 [id number]  Hector Green is an 56 y.o. male who was referred to [facility] by Dr. Elliot Gault care coordination and home safety evaluation. 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   I am seeing Hector Green at home today for our 3rd home visit to continue addressing care coordination, disease education, community resource, and home safety/fall risk reduction needs.   Subjective: "I'm excited about going to [employer] tomorrow to get that gel cushion. I want to start wearing my leg again so I can get around on my own."  Objective:  BP (!) 150/90   Pulse 76   SpO2 98%   Review of Systems  Constitutional: Negative for fever and weight loss.  HENT: Negative.   Eyes: Negative for blurred vision.  Respiratory: Negative for cough, sputum production, shortness of breath and wheezing.   Cardiovascular: Negative for chest pain, palpitations, orthopnea, claudication and leg swelling.  Gastrointestinal: Negative for abdominal pain, constipation, diarrhea, heartburn and nausea.  Genitourinary: Negative.   Musculoskeletal: Positive for myalgias. Negative for falls.  Skin: Negative.   Neurological: Negative for dizziness, sensory change, speech change, focal weakness, loss of consciousness and headaches.  Psychiatric/Behavioral: Negative.     Physical Exam  Constitutional: He is oriented to person, place, and time. Vital signs are normal. He appears well-developed and well-nourished. He is active. He does not have a sickly appearance. He does not appear ill.  Cardiovascular: Normal rate and regular rhythm.   Respiratory: Effort normal and breath sounds normal.  He has no wheezes. He has no rhonchi. He has no rales.  GI: He exhibits no distension.  Neurological: He is alert and oriented to person, place, and time.  Skin: Skin is warm, dry and intact.  Psychiatric: He has a normal mood and affect. His speech is normal and behavior is normal. Judgment and thought content normal. Cognition and memory are normal.    Encounter Medications:   Outpatient Encounter Prescriptions as of 08/01/2017  Medication Sig  . aspirin EC 81 MG tablet Take 81 mg by mouth daily.  Marland Kitchen atorvastatin (LIPITOR) 40 MG tablet Take 40 mg by mouth daily at 6 PM.   . clobetasol cream (TEMOVATE) 1.65 % Apply 1 application topically 2 (two) times daily.  Marland Kitchen gabapentin (NEURONTIN) 300 MG capsule Take 300 mg by mouth 3 (three) times daily.   Marland Kitchen glimepiride (AMARYL) 2 MG tablet TAKE 1 TABLET BY MOUTH DAILY WITH BREAKFAST.  Marland Kitchen HYDROcodone-acetaminophen (NORCO/VICODIN) 5-325 MG tablet Take 1 tablet by mouth every 4 (four) hours as needed. (Patient not taking: Reported on 06/26/2017)  . lisinopril (PRINIVIL,ZESTRIL) 5 MG tablet TAKE 1 TABLET BY MOUTH DAILY  . metFORMIN (GLUCOPHAGE) 1000 MG tablet Take 1 tablet (1,000 mg total) by mouth 2 (two) times daily with a meal.  . warfarin (COUMADIN) 5 MG tablet TAKE 2 TABLETS (10 MG TOTAL) BY MOUTH DAILY WITH SUPPER. OR AS INSTRUCTED BY ANTICOAGULATION CLINIC.   Assessment:  56 year old male recently who recently moved from [place] to [place] and has care management needs around Hypertension and DM education and disease management, home safety/fall risk reduction, and community resource engagement  Fall Risk/Home Safety Concerns - Hector Green has had 10  falls in the last year. He is an amputee but does not currently have a gel cushion for his prothestic. He has a standard rolling walker but sometimes experiences weakness and fatigue in his left arm.   Hector Green typically leaves his apartment in the morning and goes down to the communal patio and stays there  all day until late afternoon or early evening, to avoid going up and down on the sidewalk or in and out of the apartment. He was able to get moved to a floor level, handicapped accessible apartment.   Hector Green has an appointment at [facility] in [place] tomorrow to be evaluated for a new gel cushion for his right lower limb prosthetic. We have arranged transportation for him.   DME Needs - Hector Green is interested in acquiring a motorized wheelchair. He has the rolling walker with 3 inch wheels but has fallen several times using it. I delivered orders and other required documentation in person to Hector Green on behalf of Dr. Nevada Crane. The order is in the prior approval process according to the DME division representative at [facility]. Hector Green has been updated today about the status.   Chronic Health Condition (DM)- Hector Green received his new CBG meter last week and I helped him set it up and reviewed instructions/provided demonstration for proper use. He has been checking fasting cbg's over the last week and his 7 day average is 237. He has had 2 readings slightly greater than 300. He has been asymptomatic. This coming week, Hector Green will check his cbg's in the afternoons so we will have an idea of his post prandial findings. When I visit next week, we will review his findings and from there determine a plan of care for lifestyle and nutrition management. I have provided print materials with pictures and have reviewed his prescribed carb modified diet with him.   Food Resources - Hector Green is receiving freezer meals through his [facility] care plan. He now has a microwave after qualifying for patient assistance through the [place].  Someone donated a crock pot to him and he now has a plate and 2 bowls he bought at a yard sale. Hector Green purchased a few canned items and some bread and lunch meat and is now eating 2 meals/daily.    Chronic Health Condition (HTN) - Hector Green is  taking his blood pressure medication as prescribed. As noted, he has a medical history which includes hypertension, CAD s/p CABG, and PAD. He has not seen a cardiologist "in a long time". He would like to start seeing a cardiologist locally. I have called [place] as per Hector Green request to help him establish care with a cardiologist but he hasn't heard from them. I will ask Dr. Juel Burrow office to send a referral if Dr. Nevada Crane agrees.   Community Resources- Hector Green lives literally within [place] of the [employer]. When Hector Green turns 56 years old next month, he can attend the senior center because of his disability status. Hector Green friend drove him to the [facility] yesterday where I met him and helped him tour the facility and complete an enrollment application. He is very excited about becoming involved there especially during the winter months.     No Advanced Directives in Place- I provided Mr. Auxier with an advanced directives packet and reviewed it with him. Today, he says he is not sure who he wants to name as his designee but is  considering a few friends.  Plan:   Mr. Mitchelle will attend his appointment at [facility] tomorrow. Transportation has been arranged to and from.  Mr. Dugger will check his cbg's daily, in the afternoon, over the next week.  I will see Mr. Mckey at home next week to follow up.

## 2017-08-07 DIAGNOSIS — M545 Low back pain: Secondary | ICD-10-CM | POA: Diagnosis not present

## 2017-08-07 DIAGNOSIS — G894 Chronic pain syndrome: Secondary | ICD-10-CM | POA: Diagnosis not present

## 2017-08-07 DIAGNOSIS — M79662 Pain in left lower leg: Secondary | ICD-10-CM | POA: Diagnosis not present

## 2017-08-07 DIAGNOSIS — T8789 Other complications of amputation stump: Secondary | ICD-10-CM | POA: Diagnosis not present

## 2017-08-07 DIAGNOSIS — Z79891 Long term (current) use of opiate analgesic: Secondary | ICD-10-CM | POA: Diagnosis not present

## 2017-08-08 ENCOUNTER — Other Ambulatory Visit: Payer: Self-pay | Admitting: *Deleted

## 2017-08-08 NOTE — Patient Outreach (Signed)
[facility]) Care Management   08/08/2017  EGON DITTUS [birth year] [id number]  JESSICA SEIDMAN is an 56 y.o. male who was referred to [facility] by Dr. Elliot Gault care coordination and home safety evaluation. Mr. 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   I am seeing Mr. Livesey at home today to continue addressing care coordination, disease education, community resource, and home safety/fall risk reduction needs.   Subjective: "I didn't check my blood sugars because I didn't have alcohol wipes."  Objective:  BP (!) 142/82   Pulse 76   SpO2 97%   Review of Systems  Constitutional: Negative.   HENT: Negative.   Eyes: Negative.   Respiratory: Negative.   Cardiovascular: Negative.  Negative for leg swelling.  Gastrointestinal: Negative.   Genitourinary: Negative.   Musculoskeletal: Negative.  Negative for falls.  Skin: Negative.   Neurological: Negative.   Psychiatric/Behavioral: Negative.     Physical Exam  Constitutional: He is oriented to person, place, and time. Vital signs are normal. He appears well-developed and well-nourished. He is active. He does not have a sickly appearance. He does not appear ill.  Cardiovascular: Normal rate and regular rhythm.   Respiratory: Effort normal.  Neurological: He is alert and oriented to person, place, and time.  Skin: Skin is warm, dry and intact.  Psychiatric: He has a normal mood and affect. His speech is normal and behavior is normal. Judgment and thought content normal. Cognition and memory are normal.    Encounter Medications:   Outpatient Encounter Prescriptions as of 08/08/2017  Medication Sig  . aspirin EC 81 MG tablet Take 81 mg by mouth daily.  Marland Kitchen  atorvastatin (LIPITOR) 40 MG tablet Take 40 mg by mouth daily at 6 PM.   . clobetasol cream (TEMOVATE) 7.98 % Apply 1 application topically 2 (two) times daily.  Marland Kitchen gabapentin (NEURONTIN) 300 MG capsule Take 300 mg by mouth 3 (three) times daily.   Marland Kitchen glimepiride (AMARYL) 2 MG tablet TAKE 1 TABLET BY MOUTH DAILY WITH BREAKFAST.  Marland Kitchen HYDROcodone-acetaminophen (NORCO/VICODIN) 5-325 MG tablet Take 1 tablet by mouth every 4 (four) hours as needed. (Patient not taking: Reported on 06/26/2017)  . lisinopril (PRINIVIL,ZESTRIL) 5 MG tablet TAKE 1 TABLET BY MOUTH DAILY  . metFORMIN (GLUCOPHAGE) 1000 MG tablet Take 1 tablet (1,000 mg total) by mouth 2 (two) times daily with a meal.  . warfarin (COUMADIN) 5 MG tablet TAKE 2 TABLETS (10 MG TOTAL) BY MOUTH DAILY WITH SUPPER. OR AS INSTRUCTED BY ANTICOAGULATION CLINIC.   Assessment:  56 year old male recently who recently moved from [place] to [place] and has care management needs around hypertension and DM education and disease management, home safety/fall risk reduction, and community resource engagement.  Fall Risk/Home Safety Concerns - Mr. Hamada has had 10 falls in the last year. He is an amputee and was able to get an appointment last week with [facility] in [place] for adjustments needed to his prosthesis. Today, he says he is wearing his prosthetic 1-2 hours at a time and trying to get used to wearing it again. He is now in a floor level, handicapped accessible apartment. He has not had falls in the last month.  DME Needs - Mr. Graves is interested in acquiring a motorized wheelchair.  He has the rolling walker with 3 inch wheels but has fallen several times using it. I delivered orders and other required documentation in person to [name] on behalf of Dr. Nevada Crane. The order is in the prior approval process according to the DME division representative at [facility]. Mr. Oberholzer has been updated today about the status.   Chronic Health Condition  (DM)-[name] received his new CBG meter last week and I helped him set it up and reviewed instructions/provided demonstration for proper use. He has not been checking cbg's over the last week as instructed per his report because he was out of alcohol preps. I helped Mr. Goudeau obtain needed supplies through [employer].   His CBG today was just over 300 and he said he'd had 2 bags of chips for his only meal of the day thus far. He has not had signs or symptoms of hypo or hyperglycemia.  his coming week, Mr. Hartel will check his cbg's in the afternoons so we will have an idea of his post prandial findings. When I visit next week, we will review his findings and from there determine a plan of care for lifestyle and nutrition management. I have provided print materials with pictures and have reviewed his prescribed carb modified diet with him.   Chronic Health Condition (HTN) - Mr. Marohl is taking his blood pressure medication as prescribed. As noted, he has a medical history which includes hypertension, CAD s/p CABG, and PAD.  Community Resources- Mr. Manthey lives literally within [place] of the [employer]. When Mr. Mahaffy turns 56 years old next month, he can attend the senior center because of his disability status. Mr. Paschal friend drove him to the [facility] yesterday where I met him and helped him tour the facility and complete an enrollment application. He is very excited about becoming involved there especially during the winter months.    No Advanced Directives in Place- I provided Mr. Reesman with an advanced directives packet and reviewed it with him. Today, he says he is not sure who he wants to name as his designee but is considering a few friends.  Plan:   Mr. Bertha will attend his upcoming scheduled appointments. I have forwarded transportation assistance request information ([facility] on  09/06/17 @ 11:30) to my social work partner for assistance.  Mr. Sorg will check his cbg's daily, in the afternoon, over the next week.  I will see Mr. Haggar at home next week to follow up.   [facility] CM Care Plan Problem Three     Most Recent Value  Care Plan Problem Three  Knowledge and Resource Deficits for Self Health Management of Chronic Disease (DM and HTN)  Role Documenting the Problem Three  Care Management Coordinator  Care Plan for Problem Three  Active  [facility] Long Term Goal   Over the next 60 days, patient will verbalize undersanding of basic plan to begin self health management practices related to chronic disease states (DM and HTN)  [facility] Long Term Goal Start Date  06/26/17  Interventions for Problem Three Long Term Goal  Utilizing teachback method, discussion, and prescribed Emmi educational tools, discussed need to employ self health management strategies for chronic disease management  [facility] CM Short Term Goal #1   Over the next 30 days, patient will obtain cbg monitor and begin checking and recording cbg's  [facility] CM Short Term Goal #1 Start Date  06/26/17  [facility] CM Short Term Goal #1 Met Date  07/25/17  Interventions for Short Term Goal #1  requested order for cbg monitor from pcp office to be sent to [facility] CM Short Term Goal #2   Over the next 30 days, patient will obtain blood pressure monitor and begin checking recording bp's  [facility] CM Short Term Goal #2 Start Date  08/08/17  Interventions for Short Term Goal #2  requested approval for blood pressure monitor for patient through [facility] CM Short Term Goal #3   Over the next 7 days patient will check cbg's daily  [facility]  CM Short Term Goal #3 Start Date  08/08/17  Interventions for Short Term Goal #3  reviewed previous week's findings,  uriltiizing teachback method, discussed findings and provided instruction about necessity of monitoring post prandial findings      [place]  [facility]  [phone]

## 2017-08-11 ENCOUNTER — Other Ambulatory Visit: Payer: Self-pay | Admitting: *Deleted

## 2017-08-11 NOTE — Patient Outreach (Signed)
[facility]) Care Management  08/11/2017  Chales SalmonJohn W Biel 1961/07/13 [id number]  [name] reached out to me to tell me that his medicaid case worker suggested that we check with [facility] to make sure they were not trying to file with his old [employer] insurance for coverage for his ordered motorized wheelchair. I reached out to [place]vene (covering for Baldo AshLisa Harris - prior authorization specialist) and provided current insurance information to her. Marvene kindly offered to ask Ms. Harris to return a call to me when she returns to work next week. I updated [name] about the aforementioned information.   Plan: I will follow up with [facility] next week.    Marja Kayslisa Gilboy MHA,BSN,RN,CCM [facility]  [phone]

## 2017-08-15 ENCOUNTER — Other Ambulatory Visit: Payer: Self-pay | Admitting: *Deleted

## 2017-08-15 NOTE — Patient Outreach (Signed)
[facility]) Care Management  08/15/2017  Chales SalmonJohn W Lauman 1961/06/09 [id number]  In person visit to [facility] today on behalf of [name] to follow up on status of his motorized wheelchair. The prior authorization specialist was not in house but her designee indicated that face to face and other prior authorization materials had been faxed to the office of Dr. [name] for completion.   I reached out to Kendal HymenAnne Dell Cannon at Dr. Scharlene GlossHall's office to notify her of the documents sent and to offer any assistance needed in follow up.   Plan: I will see [name] at home on 08/24/17 @ 10:30am for routine home visit/follow up.    Marja Kayslisa Nechelle Petrizzo MHA,BSN,RN,CCM [facility]  [phone]

## 2017-08-18 ENCOUNTER — Other Ambulatory Visit: Payer: Self-pay | Admitting: *Deleted

## 2017-08-18 NOTE — Patient Outreach (Signed)
[facility]) Care Management  08/18/2017  KATELYN KOHLMEYER [birth year] [id number]   CSW called & spoke with patient to follow-up on patient's needs. Patient informed CSW that he has an appointment scheduled 9/26 at 11:30am at [facility] and requested assistance with transportation. CSW called [facility] and arranged for [employer] transportation to take him to his appointment. CSW made patient aware that transportation was arranged & informed him of how to call [facility] for future transportation needs. CSW asked patient if he had any other CSW needs, patient states that he still has the information provided on food pantries and feels "all set".   CSW will perform a case closure on patient, as all goals of treatment have been met from social work standpoint and no additional social work needs have been identified at this time. CSW will notify patient's RNCM with [facility], [facility] of CSW's plans to close patient's case.   CSW will fax an update to patient's Primary Care Physician, Dr. Wende Neighbors to ensure that they are aware of CSW's involvement with patient's plan of care.   CSW ensured that patient is aware of RNCM with [facility] continued involvement with patient's care.    Raynaldo Opitz, LCSW [facility]  Clinical Social Worker cell #: [phone]

## 2017-08-24 ENCOUNTER — Other Ambulatory Visit: Payer: Self-pay | Admitting: *Deleted

## 2017-08-24 NOTE — Patient Outreach (Signed)
[facility]) Care Management   08/24/2017  Hector Green 1961/07/04 [id number]  Hector Green is an 56 y.o. male who was referred to [facility] by Dr. Elliot Gault care coordination and home safety evaluation. 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   I am seeing [name] at home today to continue addressing care coordination, disease education, community resource, and home safety/fall risk reduction needs.   Subjective: "I'm doing pretty good. I just really want to get that motorized wheelchair"   Objective:   Review of Systems  Constitutional: Negative.   HENT: Negative.   Eyes: Negative.   Respiratory: Negative.  Negative for cough, sputum production, shortness of breath and wheezing.   Cardiovascular: Negative for chest pain, palpitations and leg swelling.  Gastrointestinal: Negative.   Genitourinary: Negative.   Musculoskeletal: Positive for myalgias. Negative for falls.  Skin: Negative.   Neurological: Negative.   Psychiatric/Behavioral: Negative.     Physical Exam  Constitutional: He is oriented to person, place, and time. He appears well-developed and well-nourished.  Cardiovascular: Normal rate and regular rhythm.   Respiratory: Effort normal and breath sounds normal. He has no wheezes. He has no rhonchi. He has no rales.  GI: Soft. Bowel sounds are normal.  Neurological: He is alert and oriented to person, place, and time.  Skin: Skin is warm, dry and intact.  Psychiatric: He has a normal mood and affect. His speech is normal and behavior is normal. Judgment and thought content normal. Cognition and memory are normal.    Encounter Medications:   Outpatient Encounter Prescriptions as of  08/24/2017  Medication Sig  . aspirin EC 81 MG tablet Take 81 mg by mouth daily.  Marland Kitchen atorvastatin (LIPITOR) 40 MG tablet Take 40 mg by mouth daily at 6 PM.   . clobetasol cream (TEMOVATE) 1.77 % Apply 1 application topically 2 (two) times daily.  Marland Kitchen gabapentin (NEURONTIN) 300 MG capsule Take 300 mg by mouth 3 (three) times daily.   Marland Kitchen glimepiride (AMARYL) 2 MG tablet TAKE 1 TABLET BY MOUTH DAILY WITH BREAKFAST.  Marland Kitchen HYDROcodone-acetaminophen (NORCO/VICODIN) 5-325 MG tablet Take 1 tablet by mouth every 4 (four) hours as needed.  Marland Kitchen lisinopril (PRINIVIL,ZESTRIL) 5 MG tablet TAKE 1 TABLET BY MOUTH DAILY  . metFORMIN (GLUCOPHAGE) 1000 MG tablet Take 1 tablet (1,000 mg total) by mouth 2 (two) times daily with a meal.  . warfarin (COUMADIN) 5 MG tablet TAKE 2 TABLETS (10 MG TOTAL) BY MOUTH DAILY WITH SUPPER. OR AS INSTRUCTED BY ANTICOAGULATION CLINIC.   Assessment:  56year old male recently who recently moved from [place] to [place] and has care management needs around hypertension and DM education and disease management, home safety/fall risk reduction, and community resource engagement.  Fall Risk/Home Safety Concerns - [name] has had 10 falls in the last year. He is an amputee and was able to get an appointment last week with [facility] in [place] for adjustments needed to his prosthesis. Today, he says he is wearing his prosthetic 1-2 hours at a time and trying to get used to wearing it again. He is now in a floor level, handicapped accessible apartment. He has not had falls in the last month.  DME Needs - [name] is awaiting final medicaid approval for  a motorized wheelchair. He has the rolling walker with 3 inch wheels but has fallen several times using it. I delivered orders and other required documentation in person to [name] on behalf of Dr. Nevada Crane. The order is in the prior approval process according to the DME division representative at [facility]. Mr. Waibel has been updated  today about the status.   Chronic Health Condition (DM)-Mr. Tamala Hector Green received his new CBG meter and has been checking his cbg's daily. They have been between 177-289. He is getting a better handle on the correlation between food and glucose control. I have provided print materials with pictures and have reviewed his prescribed carb modified diet with him.   Chronic Health Condition (HTN) - [name] is taking his blood pressure medication as prescribed. As noted, he has a medical history which includes hypertension, CAD s/p CABG, and PAD.  Community Resources- [name] lives within [place] of the [employer]. As he turned 49 yesterday, he can attend the senior center because of his disability status. He is very excited about becoming involved there especially during the winter months.   No Advanced Directives in Place- I provided [name] with an advanced directives packet and reviewed it with him. Today, he says he is not sure who he wants to name as his designee but is considering a few friends.  Plan:   [name] will attend his upcoming scheduled appointments. I have forwarded transportation assistance request information ([facility] on 09/06/17 @ 11:30) to my social work partner for assistance.   [name] will check his cbg's daily and will call his provider for findings outside the established parameters.   [name] will take his medications as prescribed.   [name] and I will both check with [place] next week on the status of his motorized wheelchair.   [facility] CM Care Plan Problem     Most Recent Value  Care Plan Problem Three  Knowledge and Resource Deficits for Self Health Management of Chronic Disease (DM and HTN)  Role Documenting the Problem Three  Care Management Coordinator  Care Plan for Problem Three  Active  THN Long Term Goal   Over the next 60 days, patient will demonstrate  improvement in DM self health management as evidenced by daily monitoring and recording of cbg's, verbalized/demonstrated improvement in dietary choices, and adherence to prescribed medication regimen  THN Long Term Goal Start Date  08/24/17  THN Long Term Goal Met Date    Interventions for Problem Three Long Term Goal  Utilizing teachback method, discussion, reviewed detailed self health management plan for DM  THN CM Short Term Goal #1   Over the next 30 days, patient will check cbg's daily and record  THN CM Short Term Goal #1 Start Date  08/24/17  THN CM Short Term Goal #1 Met Date    Interventions for Short Term Goal #1  reviewed rationale for daily cbg checks  THN CM Short Term Goal #2   Over the next 30 days, patient will adhere to his prescribed medication regimen as evdienced by pill count and verbalization of adherence  THN CM Short Term Goal #2 Start Date  08/24/17  THN CM Short Term Goal #2 Met Date    Interventions for Short Term Goal #2  medications reviewed with patient,  rationale for adherence discussed  THN CM Short Term Goal #3   over the next 30 days, patient  will add one vegetable and one fruit selection to his daily diet  THN  CM Short Term Goal #3 Start Date  08/24/17  THN CM Short Term Goal #3 Met Date   Interventions for Short Term Goal #3  utilizing teachback method, reviewed prescribed carb modified diet with patient      [place] Care Management  [phone]

## 2017-09-06 ENCOUNTER — Ambulatory Visit: Payer: Self-pay | Admitting: *Deleted

## 2017-09-06 DIAGNOSIS — G894 Chronic pain syndrome: Secondary | ICD-10-CM | POA: Diagnosis not present

## 2017-09-06 DIAGNOSIS — M545 Low back pain: Secondary | ICD-10-CM | POA: Diagnosis not present

## 2017-09-06 DIAGNOSIS — M79662 Pain in left lower leg: Secondary | ICD-10-CM | POA: Diagnosis not present

## 2017-09-06 DIAGNOSIS — Z79891 Long term (current) use of opiate analgesic: Secondary | ICD-10-CM | POA: Diagnosis not present

## 2017-09-08 ENCOUNTER — Other Ambulatory Visit: Payer: Self-pay | Admitting: *Deleted

## 2017-09-08 NOTE — Patient Outreach (Signed)
[facility]) Care Management  09/08/2017  Hector Green 09/23/1961 [id number]  I reached out to [facility] today to find out if needed information/documentation (face to face mobility documentation)  from the office of Dr. Dwana Melena had been received. Additional documentation is needed. I reached out to Dr. Scharlene Gloss office to request additional information today.   Plan: I will follow up on Monday by phone.    Marja Kays MHA,BSN,RN,CCM [facility] Care Management  [phone]

## 2017-09-11 ENCOUNTER — Other Ambulatory Visit: Payer: Self-pay | Admitting: *Deleted

## 2017-09-11 DIAGNOSIS — I10 Essential (primary) hypertension: Secondary | ICD-10-CM | POA: Diagnosis not present

## 2017-09-11 DIAGNOSIS — E1165 Type 2 diabetes mellitus with hyperglycemia: Secondary | ICD-10-CM | POA: Diagnosis not present

## 2017-09-11 NOTE — Patient Outreach (Signed)
[facility]) Care Management  09/11/2017  Hector Green 1960/12/17 [id number]  I reached out to Hector Green today to follow up on his appointment from last week and to respond to his request for assistance with scheduling transportation to his next provider appointment.   Transportation Needs:  As per notes from LCSW Hector Green prior to her discharge, Hector Green was instructed on how to call [facility] [phone]) to make future transportation appointments. I believe literacy and lack of confidence are barriers to Hector Green independence in self scheduling appointments and transportation. I was in the area of his residence and stopped in after calling to help him put the number for [facility] in his phone and to help him schedule transportation to his next provider appointment.   DME Needs:  [facility] has not yet received documentation needed from Hector Green office to proceed with prior approval for Hector Green motorized wheelchair. I went to Hector Green office today to work with support/clinical staff on details of needed documentation so that this can be passed along to Hector Green. I updated Hector Green on this work.   Diabetes Management: Hector Green is checking his cbg's daily and taking medications as prescribed. He still has knowledge deficits related to his prescribed carbohydrate modified diet but truly is making efforts towards adherence.   Plan: I will follow up with Hector Green by phone again next week.    Hector Green MHA,BSN,RN,CCM [facility] Care Management  [phone]

## 2017-09-12 DIAGNOSIS — E1140 Type 2 diabetes mellitus with diabetic neuropathy, unspecified: Secondary | ICD-10-CM | POA: Diagnosis not present

## 2017-09-12 DIAGNOSIS — M79605 Pain in left leg: Secondary | ICD-10-CM | POA: Diagnosis not present

## 2017-09-12 DIAGNOSIS — I10 Essential (primary) hypertension: Secondary | ICD-10-CM | POA: Diagnosis not present

## 2017-09-12 DIAGNOSIS — G8929 Other chronic pain: Secondary | ICD-10-CM | POA: Diagnosis not present

## 2017-09-12 DIAGNOSIS — M545 Low back pain: Secondary | ICD-10-CM | POA: Diagnosis not present

## 2017-09-14 ENCOUNTER — Other Ambulatory Visit: Payer: Self-pay | Admitting: *Deleted

## 2017-09-14 NOTE — Patient Outreach (Signed)
[facility]) Care Management  09/14/2017  Hector Green Oct 29, 1961 [id number]  In person care coordination visit with practice manager Alene Mires at the office of Dr. Wende Neighbors regarding documentation request for submission to [facility] who is handling prior authorization for a motorized wheelchair for Hector Green.   I spoke with [name] by phone today to notify him that I had met with the practice manager at the office of Dr. Nevada Crane and where we are in the processing the request for information needed by his insurance provider for approval for his wheelchair.   Plan: I will follow up with Alene Mires no later than Monday to offer any additional support needed for assistance to [name].    [place] Management  [phone]

## 2017-09-19 ENCOUNTER — Other Ambulatory Visit: Payer: Self-pay | Admitting: *Deleted

## 2017-09-19 NOTE — Patient Outreach (Signed)
[facility]) Care Management  09/19/2017  STEFFAN CANIGLIA [birth year] [id number]  Outreach to the office of Dr. Dwana Melena today to follow up on documentation request to assist with prior approval for motorized wheelchair. I was notified by Dr. Scharlene Gloss office that requested documentation has been faxed to [facility]. I reached out to the prior approval specialist, Misty Stanley, and left a voice message requesting a return call.   Plan: I will follow up with Misty Stanley tomorrow.    Marja Kays MHA,BSN,RN,CCM [facility] Care Management  [phone]

## 2017-09-20 ENCOUNTER — Other Ambulatory Visit: Payer: Self-pay | Admitting: *Deleted

## 2017-09-20 NOTE — Patient Outreach (Signed)
[facility]) Care Management  09/20/2017  AGRON SWINEY [birth year] [id number]  Cal received from Rochel Brome re: DME documentation needs by [place] for Mr. [place]ah Vandergriff.   Plan: Ms. Maple Hudson will call Baldo Ash @ [facility] in follow up. I will reach out to Mr. Grabel to update him on progress.    Marja Kays MHA,BSN,RN,CCM [facility] Care Management  [phone]

## 2017-09-21 ENCOUNTER — Other Ambulatory Visit: Payer: Self-pay | Admitting: *Deleted

## 2017-09-21 NOTE — Patient Outreach (Signed)
[facility]) Care Management  09/21/2017  HARLAN ERVINE 1961/03/10 [id number]   I spoke with the patient care coordinator at the office of Dr. Dwana Melena who informed me that the provided documentation to [facility] needed to submit for approval for a motorized wheelchair for Mr. Breighner was not deemed sufficient. Dr. Margo Aye has ordered an outpatient PT evaluation for Mr. Isham. I reached out to Mr. Thibault by phone but was unable to get him. I left him a message explaining the aforementioned information (he consented for information to be left on his voice messages).   Plan: I will follow up with MR. Dolecki re: his PT appointment within the next 3 business days.    Marja Kays MHA,BSN,RN,CCM [facility] Care Management  [phone]

## 2017-09-25 ENCOUNTER — Other Ambulatory Visit: Payer: Self-pay | Admitting: *Deleted

## 2017-09-25 NOTE — Patient Outreach (Signed)
[facility]) Care Management  09/25/2017  Hector Green 1961/02/12 [id number]  Hector Green is an 56 y.o. male who was referred to [facility] Management by Dr. Novella Olive care coordination and home safety evaluation. 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   I reached out to Baldo Ash at [facility] today to follow up on her recent request for additional information from the office of Dr. Margo Aye which is to be submitted to Mr. [facility] payor for approval of coverage of motorized wheelchair. I was unable to reach Ms. Harris but left a voice message requesting a return call.    Marja Kays MHA,BSN,RN,CCM [facility] Care Management  [phone]

## 2017-09-26 ENCOUNTER — Other Ambulatory Visit: Payer: Self-pay | Admitting: *Deleted

## 2017-09-26 NOTE — Patient Outreach (Signed)
[facility]) Care Management  09/26/2017  ARNO CULLERS [birth year] [id number]  I reached out to Mr. Hardrick today to notify him that he now has an outpatient PT appointment in [place] tomorrow for a wheelchair assessment. I contacted [facility] for assistance with transportation. He will picked up by 9:15 for his 9:30am appointment tomorrow and will be picked up for return home at 11:15. I spoke with Kendal Hymen at [facility] who confirmed his Medicaid transport.   Plan: I will follow up with Mr. Howington and [facility] later this week re: progress on Mr. Kaufhold motorized wheelchair.    Marja Kays MHA,BSN,RN,CCM [facility] Care Management  [phone]

## 2017-09-27 ENCOUNTER — Encounter (HOSPITAL_COMMUNITY): Payer: Self-pay

## 2017-09-27 ENCOUNTER — Ambulatory Visit (HOSPITAL_COMMUNITY): Payer: 59 | Attending: Internal Medicine

## 2017-09-27 ENCOUNTER — Other Ambulatory Visit: Payer: Self-pay | Admitting: *Deleted

## 2017-09-27 ENCOUNTER — Ambulatory Visit: Payer: Self-pay | Admitting: *Deleted

## 2017-09-27 DIAGNOSIS — Z7689 Persons encountering health services in other specified circumstances: Secondary | ICD-10-CM | POA: Diagnosis not present

## 2017-09-27 DIAGNOSIS — R29898 Other symptoms and signs involving the musculoskeletal system: Secondary | ICD-10-CM

## 2017-09-27 NOTE — Therapy (Signed)
[facility] [address] [place], [place], [address] Phone: [phone]   Fax:  [phone]  Occupational Therapy Wheelchair Evaluation  Patient Details  Name: Hector Green MRN: [id number] Date of Birth: 1961-01-05 Referring Provider: Dr. [name]  Encounter Date: 09/27/2017      OT End of Session - 09/27/17 1033    Visit Number 1   Number of Visits 1   Authorization Type 1) Medicare 2) Generic Commercial 3) Medicaid   OT Start Time 0945   OT Stop Time 1030   OT Time Calculation (min) 45 min   Activity Tolerance Patient tolerated treatment well   Behavior During [facility] for tasks assessed/performed      Past Medical History:  Diagnosis Date  . Abnormal nuclear stress test 05/06/2015  . CAD (coronary artery disease) 2011   [place]  . Essential hypertension    not on medications at this time  . Hyperlipidemia   . Hypertension   . Myocardial infarction (HCC) 2011  . Paranoid schizophrenia (HCC)   . Peripheral arterial disease (HCC)   . Type 2 diabetes mellitus (HCC)    type II    Past Surgical History:  Procedure Laterality Date  . ABDOMINAL AORTAGRAM N/A 03/30/2015   Procedure: ABDOMINAL Ronny FlurryAORTAGRAM;  Surgeon: Fransisco HertzBrian L Chen, MD;  [facility];  Service: Cardiovascular;  Laterality: N/A;  . AMPUTATION Right 07/08/2015   Procedure: AMPUTATION BELOW RIGHT KNEE;  Surgeon: Nadara MustardMarcus Duda V, MD;  Location: [facility] OR;  Service: Orthopedics;  Laterality: Right;  . BELOW KNEE LEG AMPUTATION Right 07/08/15   Dr. Lajoyce Cornersuda  . CARDIAC CATHETERIZATION N/A 05/06/2015   Procedure: Left Heart Cath and Cors/Grafts Angiography;  Surgeon: Tonny BollmanMichael Cooper, MD;  [name];  Service: Cardiovascular;  Laterality: N/A;  . COLONOSCOPY    . CORONARY ARTERY BYPASS GRAFT  2011   Three vessel by report  . ENDARTERECTOMY FEMORAL Right 05/13/2015   Procedure: RIGHT COMMON FEMORAL, SUPERFICIAL FEMORAL AND  PROFUNDA ENDARTERECTOMY ;  Surgeon: Pryor OchoaJames D Lawson, MD;  [facility];  Service: Vascular;  Laterality: Right;  . FEMORAL-TIBIAL BYPASS GRAFT Right 05/13/2015   Procedure: BYPASS GRAFT RIGHT FEMORAL-POSTERIOR TIBIAL ARTERY USING LEFT NONREVERSED TRANSLOCATED GREATER SAPPHENOUS VEIN;  Surgeon: Pryor OchoaJames D Lawson, MD;  [facility];  Service: Vascular;  Laterality: Right;  . FEMORAL-TIBIAL BYPASS GRAFT Right 05/27/2015   Procedure: THROMBECTOMY OF RIGHT FEMORAL-POSTERIOR TIBIAL ARTERY SAPHENOUS VEIN BYPASS GRAFT ;  Surgeon: Pryor OchoaJames D Lawson, MD;  [facility];  Service: Vascular;  Laterality: Right;  . INTRAOPERATIVE ARTERIOGRAM Right 05/13/2015   Procedure: RIGHT LOWER LEG INTRA OPERATIVE ARTERIOGRAM;  Surgeon: Pryor OchoaJames D Lawson, MD;  [facility];  Service: Vascular;  Laterality: Right;  . INTRAOPERATIVE ARTERIOGRAM Right 05/27/2015   Procedure: INTRA OPERATIVE ARTERIOGRAM;  Surgeon: Pryor OchoaJames D Lawson, MD;  [facility];  Service: Vascular;  Laterality: Right;  . LOWER EXTREMITY ANGIOGRAM N/A 03/30/2015   Procedure: LOWER EXTREMITY ANGIOGRAM;  Surgeon: Fransisco HertzBrian L Chen, MD;  [facility];  Service: Cardiovascular;  Laterality: N/A;  . PATCH ANGIOPLASTY Right 05/13/2015   Procedure: RIGHT FEMORAL ARTERY PATCH ANGIOPLASTY;  Surgeon: Pryor OchoaJames D Lawson, MD;  [facility];  Service: Vascular;  Laterality: Right;  . STUMP REVISION Right 08/28/2015   Procedure: Revision Right Below Knee Amputation;  Surgeon: Nadara MustardMarcus Duda V, MD;  [facility];  Service: Orthopedics;  Laterality: Right;  . STUMP REVISION Right  10/30/2015   Procedure: Revision Right Below Knee Amputation;  Surgeon: Nadara Mustard, MD;  Location: [facility] OR;  Service: Orthopedics;  Laterality: Right;  . THROMBECTOMY FEMORAL ARTERY Right 05/13/2015   Procedure: THROMBECTOMY RIGHT FEMORAL-PROXIMAL POSTERIOR TIBAIL ARTERY BYPASS GRAFT;  Surgeon: Larina Earthly, MD;  Location: [facility] OR;  Service: Vascular;  Laterality: Right;  . VEIN HARVEST Left 05/13/2015    Procedure: LEFT GREATER SAPPHENOUS VEIN HARVEST;  Surgeon: Pryor Ochoa, MD;  Location: [facility] OR;  Service: Vascular;  Laterality: Left;    There were no vitals filed for this visit.         [facility] OT Assessment - 09/27/17 1032      Assessment   Diagnosis Wheelchair evaluation   Referring Provider Dr. Dwana Melena          Date: 09/27/17 Patient Name: Hector Green Address: [address] [place]. [place], [place] [address] DOB: [birth year]   To Whom It May Concern,    Hector Green was seen in this clinic for a power wheelchair evaluation. Hector Green has a past medical history that includes DM II, Essential hypertension, Mixed hyperlipidemia, Cellulitis of RLE, Hyperkalemia, Low back pain, Chronic pain, and Idiopathic peripheral autonomic neuropathy. Hector Green's surgical history includes: Right below the knee amputation 3.5 years ago.  Hector Green lives alone in a 2-bedroom apartment on the main floor with a level entry. He reports that his apartment is handicap accessible. Hector Green is responsible for all meal prep, housekeeping activities, and bathing/dressing tasks, which he completes at Mod I level with increased time. Hector Green has an Aide available to him 7 days a week who will drive him to his appointments. Hector Green reports that even though his Aide is available to assist him with anything, he prefers to be as independent as he possibly can for as long as his is able.  Hector Green has owned a manual wheelchair in the past and at the time does not have access to one. He uses his 2 wheeled walker for all home ambulation. When at the grocery store, he will use a motorized cart when available. He has a right leg prosthesis although is only able to wear it for 1.5 hours due to increased pain in his residual limb. At this time, Mr. Happ preparing all his meals standing up at the counter. He is unable to ambulate while holding his food plate so he has positioned his table against his counter to  allow him to slide his food over and sit down to eat.   Hector Green reports four falls in the last 6 months. He states his carpet was removed and replaced with hard surface flooring which causes his walker to slip out from under him.  A FULL PHYSICAL ASSESSMENT REVEALS THE FOLLOWING    Existing Equipment:   Hector Green owns a wheeled walker, shower seat and a right leg prosthesis.   Transfers:  Hector Green lives alone and completes all functional transfers; including to and from the chair; at Mod I level while using his roller walker. Hector Green relies heavily on his BUEs to push up from the chair from which he is sitting.  Head and Neck: WFL  Trunk: WFL with increased pain with rotation and forward flexion.  Hip: WFL active movement with flexion strength at 4/5 bilaterally.  Upper Extremities: Bilateral shoulder A/ROM is [facility]. Right Shoulder strength: 4+/5 for flexion, abduction, IR/er. Left shoulder strength: 3+/5 for flexion, abduction, and IR/er. Functional right  grip strength. Left grip strength is poor.   Lower Extremities:  Left lower extremity A/ROM is [facility]. Left knee strength: flexion/extension (3+/5), left ankle planter flexion/dorsiflexion (3/5). Right knee strength: flexion/extension (4+/5).  Weight Shifting Ability:  Hector Green is able to weight shift independently.  Skin Integrity:  Hector Green has no history of skin breakdown.   Cognition:  WFL  Activity Tolerance: Poor. Hector Green reports a pain level of 8/10 that is present in his left ankle and radiates up his leg to his low back. With pain medication, he is able to lower it to 3-4/10. Pain increases with ambulation. Hector Green also reports increased pain in his left UE, which has no known injury. Mr. Gulyas states that he is unable to propel a manual wheelchair at this time due to his left arm weakness and pain. Mr. Betzold reports that he is able to walk approximately 50 feet at the most using his walker. Mr. Domangue enjoys spending time  outside sitting. When required to walk from the outside in (approximately 50 feet), he is required to take 2-3 seated rest breaks.   GOALS/OBJECTIVE OF SEATING INTERVENTION:  Recommendation: Mr. Reader would benefit from a power wheelchair for use in his home and in the community. Mr. Jeansonne is limited by his chronic pain, left UE weakness and pain, and decreased activity tolerance, which impairs his ability to self-propel a manual wheelchair.  A scooter would not be beneficial for Mr. Qin. He does not have the adequate space needed in his home for the wide turning radius required by the scooter. Mr. Federici is motivated and willing to use his power wheelchair and is physically and mentally able to operate a power wheelchair. A power wheelchair will be the best mobility device for Mr. Avans. A power wheelchair would increase Mr. Joplin safety and ability to be more mobile during daily activities and his quality of life.     If you require any further information concerning Mr. Granholm positioning, independence or mobility needs; or any further information why a lesser device will not work, please do not hesitate to contact me at [facility], [address] [place], [place] [phone].     Thank you for this referral,   ____________________        Limmie Patricia OTR/L, CBIS                      Plan - 10/14/17 1033    OT Frequency One time visit   OT Treatment/Interventions Patient/family education   Clinical Decision Making Limited treatment options, no task modification necessary   Consulted and Agree with Plan of Care Patient      Patient will benefit from skilled therapeutic intervention in order to improve the following deficits and impairments:  Decreased strength  Visit Diagnosis: Other symptoms and signs involving the musculoskeletal system - Plan: Ot plan of care cert/re-cert      Green-Codes - 10/14/2017 1034    Functional  Assessment Tool Used (Outpatient only) clinical judgement   Functional Limitation Mobility: Walking and moving around   Mobility: Walking and Moving Around Current Status [id number]) At least 80 percent but less than 100 percent impaired, limited or restricted   Mobility: Walking and Moving Around Goal Status (G9562) At least 80 percent but less than 100 percent impaired, limited or restricted   Mobility: Walking and Moving Around Discharge Status [phone]) At least 80 percent but less than 100  percent impaired, limited or restricted      Problem List Patient Active Problem List   Diagnosis Date Noted  . Preoperative clearance 07/20/2016  . Toenail deformity 12/01/2015  . Healthcare maintenance 12/01/2015  . Below knee amputation status (HCC) 07/08/2015  . Paranoid schizophrenia, chronic condition (HCC) 06/26/2015  . Numbness and tingling-  Right Leg/ FOOT 06/10/2015  . Pain of right lower extremity 06/10/2015  . Malnutrition of moderate degree (HCC) 05/29/2015  . Femoral-tibial bypass graft occlusion, right (HCC) 05/27/2015  . PAD (peripheral artery disease) (HCC) 05/12/2015  . Type II diabetes mellitus with complication, uncontrolled (HCC) 05/08/2015  . Abnormal nuclear stress test 05/06/2015  . CAD (coronary artery disease) of artery bypass graft 04/29/2015  . Hyperlipidemia 04/29/2015  . Essential hypertension 04/29/2015  . Tobacco abuse 04/29/2015  . Critical lower limb ischemia 03/31/2015  . Atherosclerosis of native arteries of extremity with rest pain [facility]) 03/30/2015   Limmie Patricia, OTR/L,CBIS  [phone]  09/27/2017, 11:08 AM  [facility] [address] [place], [place], [address] Phone: [phone]   Fax:  [phone]  Name: CROSS JORGE MRN: [id number] Date of Birth: Aug 15, 1961

## 2017-09-27 NOTE — Patient Outreach (Signed)
[facility]) Care Management  09/27/2017  Hector SalmonJohn W Green [birth year] [id number]  [name] called me to report that he finished his OT evaluation as ordered today [facility] [place]. I forwarded the completed evaluation to [facility] and to Dr. Dwana MelenaZack Green. I reached out to [name] to notify him of the same.   Plan: I will follow up with Hector HalstedLisa Green and [name] no later than Friday re: progress on obtaining a power wheelchair for [name].    Hector Green MHA,BSN,RN,CCM [facility] Care Management  [phone]

## 2017-09-29 ENCOUNTER — Other Ambulatory Visit: Payer: Self-pay | Admitting: *Deleted

## 2017-09-29 NOTE — Patient Outreach (Signed)
[facility]) Care Management  09/29/2017  Hector SalmonJohn W Wence [birth year] [id number]  I reached out to Baldo AshLisa Harris @ [facility] to follow up on progress with prior approval for Hector Green's power wheelchair. I was unable to reach Ms. Harris but left a message requesting a return call.   Plan: I will follow up with Ms. Harris and Hector Green next week re: motorized wheelchair.    Marja Kayslisa Gilboy MHA,BSN,RN,CCM [facility] Care Management  [phone]

## 2017-10-03 DIAGNOSIS — G894 Chronic pain syndrome: Secondary | ICD-10-CM | POA: Diagnosis not present

## 2017-10-03 DIAGNOSIS — Z79891 Long term (current) use of opiate analgesic: Secondary | ICD-10-CM | POA: Diagnosis not present

## 2017-10-03 DIAGNOSIS — M545 Low back pain: Secondary | ICD-10-CM | POA: Diagnosis not present

## 2017-10-03 DIAGNOSIS — Z7689 Persons encountering health services in other specified circumstances: Secondary | ICD-10-CM | POA: Diagnosis not present

## 2017-10-03 DIAGNOSIS — M79662 Pain in left lower leg: Secondary | ICD-10-CM | POA: Diagnosis not present

## 2017-10-11 DIAGNOSIS — R7301 Impaired fasting glucose: Secondary | ICD-10-CM | POA: Diagnosis not present

## 2017-10-11 DIAGNOSIS — I10 Essential (primary) hypertension: Secondary | ICD-10-CM | POA: Diagnosis not present

## 2017-10-11 DIAGNOSIS — E1165 Type 2 diabetes mellitus with hyperglycemia: Secondary | ICD-10-CM | POA: Diagnosis not present

## 2017-10-12 ENCOUNTER — Encounter: Payer: Self-pay | Admitting: General Surgery

## 2017-10-12 ENCOUNTER — Ambulatory Visit (INDEPENDENT_AMBULATORY_CARE_PROVIDER_SITE_OTHER): Payer: Medicare Other | Admitting: General Surgery

## 2017-10-12 VITALS — BP 151/74 | HR 117 | Temp 97.5°F | Resp 18 | Ht 70.0 in | Wt 185.0 lb

## 2017-10-12 DIAGNOSIS — I878 Other specified disorders of veins: Secondary | ICD-10-CM | POA: Diagnosis not present

## 2017-10-12 DIAGNOSIS — Z7689 Persons encountering health services in other specified circumstances: Secondary | ICD-10-CM | POA: Diagnosis not present

## 2017-10-12 NOTE — Progress Notes (Signed)
[facility] History and Physical  Reason for Referral: Left leg Wound  Referring Physician: Dr. Wende Crease  Chief Complaint    Open Wound      Hector Green is a 56 y.o. male.  HPI: Hector Green is a 56 yo with multiple medical conditions including CAD, MI, PVD, DM s/p R AKA with Dr. Lajoyce Corners after a R fem to PT graft for an ischemic right leg that the subsequently became occluded.  He has not seen vascular surgery since 2016 when this graft was placed and has been seen off and on for the past 10 months for a left leg wound/ blisters with documentation from ED 02/2017 that speaks of an open wound that had been present for over 1 year.  He also underwent a CT angiogram at that visit and it demonstrated left SFA stenosis and other segmental occlusions.  The the past week the patient was given bacitracin at the urgent care and has had the wound heal in the last week per his report.  He denies any rest pain in the leg, but he does have cramping pain in his leg with ambulation. He currently is walking with a walker.  He was sent to me for the left leg wound.     He denies any fevers or chills or pain in the left leg where the wound was present.   Past Medical History:  Diagnosis Date  . Abnormal nuclear stress test 05/06/2015  . CAD (coronary artery disease) 2011   Multivessel s/p CABG in [place]  . Essential hypertension    not on medications at this time  . Hyperlipidemia   . Hypertension   . Myocardial infarction (HCC) 2011  . Paranoid schizophrenia (HCC)   . Peripheral arterial disease (HCC)   . Type 2 diabetes mellitus (HCC)    type II    Past Surgical History:  Procedure Laterality Date  . ABDOMINAL AORTAGRAM N/A 03/30/2015   Procedure: ABDOMINAL [name];  Surgeon: Fransisco Hertz, MD;  [facility] CATH LAB;  Service: Cardiovascular;  Laterality: N/A;  . AMPUTATION Right 07/08/2015   Procedure: AMPUTATION BELOW RIGHT KNEE;  Surgeon: Nadara Mustard, MD;  [facility]  OR;  Service: Orthopedics;  Laterality: Right;  . BELOW KNEE LEG AMPUTATION Right 07/08/15   Dr. Lajoyce Corners  . CARDIAC CATHETERIZATION N/A 05/06/2015   Procedure: Left Heart Cath and Cors/Grafts Angiography;  Surgeon: Tonny Bollman, MD;  Location[facility] INVASIVE CV LAB;  Service: Cardiovascular;  Laterality: N/A;  . COLONOSCOPY    . CORONARY ARTERY BYPASS GRAFT  2011   Three vessel by report  . ENDARTERECTOMY FEMORAL Right 05/13/2015   Procedure: RIGHT COMMON FEMORAL, SUPERFICIAL FEMORAL AND PROFUNDA ENDARTERECTOMY ;  Surgeon: Pryor Ochoa, MD;  Location: [facility] OR;  Service: Vascular;  Laterality: Right;  . FEMORAL-TIBIAL BYPASS GRAFT Right 05/13/2015   Procedure: BYPASS GRAFT RIGHT FEMORAL-POSTERIOR TIBIAL ARTERY USING LEFT NONREVERSED TRANSLOCATED GREATER SAPPHENOUS VEIN;  Surgeon: Pryor Ochoa, MD;  Location[facility] OR;  Service: Vascular;  Laterality: Right;  . FEMORAL-TIBIAL BYPASS GRAFT Right 05/27/2015   Procedure: THROMBECTOMY OF RIGHT FEMORAL-POSTERIOR TIBIAL ARTERY SAPHENOUS VEIN BYPASS GRAFT ;  Surgeon: Pryor Ochoa, MD;  Location: [facility] OR;  Service: Vascular;  Laterality: Right;  . INTRAOPERATIVE ARTERIOGRAM Right 05/13/2015   Procedure: RIGHT LOWER LEG INTRA OPERATIVE ARTERIOGRAM;  Surgeon: Pryor Ochoa, MD;  Location: [facility] OR;  Service: Vascular;  Laterality: Right;  . INTRAOPERATIVE ARTERIOGRAM Right 05/27/2015   Procedure: INTRA OPERATIVE  ARTERIOGRAM;  Surgeon: Pryor Ochoa, MD;  Location: [facility] OR;  Service: Vascular;  Laterality: Right;  . LOWER EXTREMITY ANGIOGRAM N/A 03/30/2015   Procedure: LOWER EXTREMITY ANGIOGRAM;  Surgeon: Fransisco Hertz, MD;  Location: [facility] CATH LAB;  Service: Cardiovascular;  Laterality: N/A;  . PATCH ANGIOPLASTY Right 05/13/2015   Procedure: RIGHT FEMORAL ARTERY PATCH ANGIOPLASTY;  Surgeon: Pryor Ochoa, MD;  Location: [facility] OR;  Service: Vascular;  Laterality: Right;  . STUMP REVISION Right 08/28/2015   Procedure: Revision Right Below Knee Amputation;  Surgeon: Nadara Mustard, MD;   Location: [facility] OR;  Service: Orthopedics;  Laterality: Right;  . STUMP REVISION Right 10/30/2015   Procedure: Revision Right Below Knee Amputation;  Surgeon: Nadara Mustard, MD;  Location: [facility] OR;  Service: Orthopedics;  Laterality: Right;  . THROMBECTOMY FEMORAL ARTERY Right 05/13/2015   Procedure: THROMBECTOMY RIGHT FEMORAL-PROXIMAL POSTERIOR TIBAIL ARTERY BYPASS GRAFT;  Surgeon: Larina Earthly, MD;  Location[facility] OR;  Service: Vascular;  Laterality: Right;  . VEIN HARVEST Left 05/13/2015   Procedure: LEFT GREATER SAPPHENOUS VEIN HARVEST;  Surgeon: Pryor Ochoa, MD;  Location: [facility] OR;  Service: Vascular;  Laterality: Left;    Family History  Problem Relation Age of Onset  . Cancer Father     Social History  Substance Use Topics  . Smoking status: Light Tobacco Smoker    Packs/day: 0.50    Years: 15.00    Types: Cigarettes    Start date: 08/24/1975  . Smokeless tobacco: Never Used  . Alcohol use No    Medications: I have reviewed the patient's current medications. Allergies as of 10/12/2017      Reactions   Fish-derived Products Anaphylaxis   Penicillins Anaphylaxis   Has patient had a PCN reaction causing immediate rash, facial/tongue/throat swelling, SOB or lightheadedness with hypotension: Yes Has patient had a PCN reaction causing severe rash involving mucus membranes or skin necrosis: No Has patient had a PCN reaction that required hospitalization emergency room visit Has patient had a PCN reaction occurring within the last 10 years: No If all of the above answers are "NO", then may proceed with Cephalosporin use.      Medication List       Accurate as of 10/12/17  4:00 PM. Always use your most recent med list.          aspirin EC 81 MG tablet Take 81 mg by mouth daily.   atorvastatin 40 MG tablet Commonly known as:  LIPITOR Take 40 mg by mouth daily at 6 PM.   clobetasol cream 0.05 % Commonly known as:  TEMOVATE Apply 1 application topically 2 (two) times daily.     gabapentin 300 MG capsule Commonly known as:  NEURONTIN Take 300 mg by mouth 3 (three) times daily.   glimepiride 2 MG tablet Commonly known as:  AMARYL TAKE 1 TABLET BY MOUTH DAILY WITH BREAKFAST.   HYDROcodone-acetaminophen 5-325 MG tablet Commonly known as:  NORCO/VICODIN Take 1 tablet by mouth every 4 (four) hours as needed.   lisinopril 5 MG tablet Commonly known as:  PRINIVIL,ZESTRIL TAKE 1 TABLET BY MOUTH DAILY   metFORMIN 1000 MG tablet Commonly known as:  GLUCOPHAGE Take 1 tablet (1,000 mg total) by mouth 2 (two) times daily with a meal.   warfarin 5 MG tablet Commonly known as:  COUMADIN TAKE 2 TABLETS (10 MG TOTAL) BY MOUTH DAILY WITH SUPPER. OR AS INSTRUCTED BY ANTICOAGULATION CLINIC.        ROS:  A  comprehensive review of systems was negative except for: Musculoskeletal: positive for cramping pain in his left leg with walking, redness over the shin area on left leg  Blood pressure (!) 151/74, pulse (!) 117, temperature (!) 97.5 F (36.4 C), resp. rate 18, height 5\' 10"  (1.778 m), weight 185 lb (83.9 kg). Physical Exam  Constitutional: He is oriented to person, place, and time and well-developed, well-nourished, and in no distress.  HENT:  Head: Normocephalic.  Eyes: Pupils are equal, round, and reactive to light.  Cardiovascular: Normal rate.   Pulses:      Popliteal pulses are 0 on the left side.       Dorsalis pedis pulses are 0 on the left side. Right dorsalis pedis pulse not accessible.       Posterior tibial pulses are 0 on the left side. Right posterior tibial pulse not accessible.  Right BKA  Pulmonary/Chest: Effort normal.  Abdominal: Soft. He exhibits no distension. There is no tenderness.  Musculoskeletal:  Shiny red skin over left skin, no ulcerated areas or blisters, loss of hair on the lower skin, minimal edema  Neurological: He is alert and oriented to person, place, and time.  Skin: Skin is warm and dry.  Psychiatric: Mood, memory,  affect and judgment normal.  Vitals reviewed.     Results: Reviewed CT angiogram from 02/2017 atherosclerotic disease with narrowing and stenosis on left   Assessment & Plan:  Hector Green is a 56 y.o. male with multiple issues going on at this time. He has some degree of venous stasis and has had a venous stasis ulcer / blistering on and off for some time. Currently this has healed but he still has the sequela of the shiny skin, loss of hair and redness in that region.  He also has fairly severe PAD with claudication symptoms with ambulation. His last amputation was for an ischemic right leg after failed bypass graft.    -Venous stasis- recommended compression stockings to help prevent further ulceration  -Arterial disease- he needs to be followed with Vascular surgery and needs to get repeat studies to determine if he is a candidate for angioplasty versus needing another graft  -Discussed with the patient that these are two separate issues and explained the arterial and venous system, but explained that working on both are key to him keeping his leg  -Also discussed that smoking cessation and diabetes control are important  -Follow up  PRN  -Have called and arranged follow up with Vascular Surgery   Future Appointments Date Time Provider Department Center  11/09/2017 1:00 PM [facility]  11/09/2017 2:45 PM Nickel, Carma LairSuzanne L, NP [facility]    All questions were answered to the satisfaction of the patient.    Lucretia RoersLindsay C Tong Pieczynski 10/12/2017, 4:00 PM

## 2017-10-12 NOTE — Patient Instructions (Signed)
Follow up with Vascular Surgery for recheck on Arteries and blood flow to your leg. Where compression socks for venous stasis to help with circulation and pushing blood back toward your heart.   Venous Ulcer A venous ulcer is a shallow sore on your lower leg. It is caused by poor circulation in your veins. Venous ulcer is the most common type of lower leg ulcer. You may have venous ulcers on one leg or on both legs. This condition most often develops around your ankles. This type of ulcer may last for a long time (chronic ulcer) or it may return often (recurrent ulcer). Follow these instructions at home: Wound care  Follow instructions from your doctor about: ? How to take care of your wound. ? When and how you should change your bandage (dressing). ? When you should remove your bandage. If your bandage is dry and gets stuck to your leg when you try to remove it, moisten or wet the bandage with saline solution or water. This helps you to remove it without harming your skin or wound.  Check your wound every day for signs of infection. Have a caregiver do this for you if you are not able to do it yourself. Watch for: ? More redness, swelling, or pain. ? More fluid or blood. ? Pus, warmth, or a bad smell. Medicines  Take over-the-counter and prescription medicines only as told by your doctor.  If you were prescribed an antibiotic medicine, take it or apply it as told by your doctor. Do not stop taking or using the antibiotic even if your condition improves. Activity  Do not stand or sit in one position for a long period of time. Rest with your legs raised during the day. If possible, keep your legs above your heart for 30 minutes, 3-4 times a day, or as told by your doctor.  Do not sit with your legs crossed.  Walk often to increase the blood flow in your legs. Ask your doctor what level of activity is safe for you.  If you are taking a long ride in a car or plane, take a break to walk  around at least once every two hours, or as told by your doctor. Ask your doctor if you should take aspirin before long trips. General instructions   Wear elastic stockings, compression stockings, or support hose as told by your doctor. This is very important.  Raise the foot of your bed as told by your doctor.  Do not smoke.  Keep all follow-up visits as told by your doctor. This is important. Contact a doctor if:  You have a fever.  Your ulcer is getting larger or is not healing.  Your pain gets worse.  You have more redness or swelling around your ulcer.  You have more fluid, blood, or pus coming from your ulcer after it has been cleaned by you or your doctor.  You have warmth or a bad smell coming from your ulcer. This information is not intended to replace advice given to you by your health care provider. Make sure you discuss any questions you have with your health care provider. Document Released: 01/05/2005 Document Revised: 05/05/2016 Document Reviewed: 04/08/2015 Elsevier Interactive Patient Education  2018 [employer]   Chronic Venous Insufficiency Chronic venous insufficiency, also called venous stasis, is a condition that prevents blood from being pumped effectively through the veins in your legs. Blood may no longer be pumped effectively from the legs back to the heart. This condition  can range from mild to severe. With proper treatment, you should be able to continue with an active life. What are the causes? Chronic venous insufficiency occurs when the vein walls become stretched, weakened, or damaged, or when valves within the vein are damaged. Some common causes of this include:  High blood pressure inside the veins (venous hypertension).  Increased blood pressure in the leg veins from long periods of sitting or standing.  A blood clot that blocks blood flow in a vein (deep vein thrombosis, DVT).  Inflammation of a vein (phlebitis) that causes a blood clot  to form.  Tumors in the pelvis that cause blood to back up.  What increases the risk? The following factors may make you more likely to develop this condition:  Having a family history of this condition.  Obesity.  Pregnancy.  Living without enough physical activity or exercise (sedentary lifestyle).  Smoking.  Having a job that requires long periods of standing or sitting in one place.  Being a certain age. Women in their 7s and 45s and men in their 46s are more likely to develop this condition.  What are the signs or symptoms? Symptoms of this condition include:  Veins that are enlarged, bulging, or twisted (varicose veins).  Skin breakdown or ulcers.  Reddened or discolored skin on the front of the leg.  Brown, smooth, tight, and painful skin just above the ankle, usually on the inside of the leg (lipodermatosclerosis).  Swelling.  How is this diagnosed? This condition may be diagnosed based on:  Your medical history.  A physical exam.  Tests, such as: ? A procedure that creates an image of a blood vessel and nearby organs and provides information about blood flow through the blood vessel (duplex ultrasound). ? A procedure that tests blood flow (plethysmography). ? A procedure to look at the veins using X-ray and dye (venogram).  How is this treated? The goals of treatment are to help you return to an active life and to minimize pain or disability. Treatment depends on the severity of your condition, and it may include:  Wearing compression stockings. These can help relieve symptoms and help prevent your condition from getting worse. However, they do not cure the condition.  Sclerotherapy. This is a procedure involving an injection of a material that "dissolves" damaged veins.  Surgery. This may involve: ? Removing a diseased vein (vein stripping). ? Cutting off blood flow through the vein (laser ablation surgery). ? Repairing a valve.  Follow these  instructions at home:  Wear compression stockings as told by your health care provider. These stockings help to prevent blood clots and reduce swelling in your legs.  Take over-the-counter and prescription medicines only as told by your health care provider.  Stay active by exercising, walking, or doing different activities. Ask your health care provider what activities are safe for you and how much exercise you need.  Drink enough fluid to keep your urine clear or pale yellow.  Do not use any products that contain nicotine or tobacco, such as cigarettes and e-cigarettes. If you need help quitting, ask your health care provider.  Keep all follow-up visits as told by your health care provider. This is important. Contact a health care provider if:  You have redness, swelling, or more pain in the affected area.  You see a red streak or line that extends up or down from the affected area.  You have skin breakdown or a loss of skin in the affected area,  even if the breakdown is small.  You get an injury in the affected area. Get help right away if:  You get an injury and an open wound in the affected area.  You have severe pain that does not get better with medicine.  You have sudden numbness or weakness in the foot or ankle below the affected area, or you have trouble moving your foot or ankle.  You have a fever and you have worse or persistent symptoms.  You have chest pain.  You have shortness of breath. Summary  Chronic venous insufficiency, also called venous stasis, is a condition that prevents blood from being pumped effectively through the veins in your legs.  Chronic venous insufficiency occurs when the vein walls become stretched, weakened, or damaged, or when valves within the vein are damaged.  Treatment for this condition depends on how severe your condition is, and it may involve wearing compression stockings or having a procedure.  Make sure you stay active by  exercising, walking, or doing different activities. Ask your health care provider what activities are safe for you and how much exercise you need. This information is not intended to replace advice given to you by your health care provider. Make sure you discuss any questions you have with your health care provider. Document Released: 04/03/2007 Document Revised: 10/17/2016 Document Reviewed: 10/17/2016 Elsevier Interactive Patient Education  2017 [employer].

## 2017-10-19 ENCOUNTER — Other Ambulatory Visit: Payer: Self-pay | Admitting: *Deleted

## 2017-10-19 NOTE — Patient Outreach (Signed)
Hector Green) Care Management   10/19/2017  Hector Green 01-Oct-1961 [id number]  Hector Green is an 56 y.o. male who was referred to [place] [facility] by Hector Green care coordination and home safety evaluation. 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   I am seeing Hector Green at his home today at his request to evaluate a "broken blister" on his left lower extremity.   Subjective: "Something felt funny on my leg and I looked down and saw this huge blister. I got a paper towel to cover it up and when I touched the skin came off and fluid leaked everywhere."  Objective: left lower extremity with freshly ruptured vesicle anterior aspect LLE; extremity mildly warm to touch       Review of Systems  Constitutional: Negative for chills, diaphoresis, fever and malaise/fatigue.  Cardiovascular: Negative for chest pain, palpitations and leg swelling.  Musculoskeletal: Negative for falls.  Skin:       See notation above  Neurological: Negative for weakness.    Physical Exam  Constitutional: He appears well-developed and well-nourished. He is active.  Non-toxic appearance. He does not have a sickly appearance. He does not appear ill. No distress.  Musculoskeletal:       Legs: Skin: Skin is warm and dry.     Psychiatric: He has a normal mood and affect. His behavior is normal. Judgment and thought content normal.    Encounter Medications:   Outpatient Encounter Medications as of 10/19/2017  Medication Sig  . aspirin EC 81 MG tablet Take 81 mg by mouth daily.  Marland Kitchen atorvastatin (LIPITOR) 40 MG tablet Take 40 mg by mouth daily at 6 PM.   . clobetasol cream (TEMOVATE) 2.70 % Apply 1 application topically 2 (two) times  daily.  Marland Kitchen gabapentin (NEURONTIN) 300 MG capsule Take 300 mg by mouth 3 (three) times daily.   Marland Kitchen glimepiride (AMARYL) 2 MG tablet TAKE 1 TABLET BY MOUTH DAILY WITH BREAKFAST.  Marland Kitchen HYDROcodone-acetaminophen (NORCO/VICODIN) 5-325 MG tablet Take 1 tablet by mouth every 4 (four) hours as needed.  Marland Kitchen lisinopril (PRINIVIL,ZESTRIL) 5 MG tablet TAKE 1 TABLET BY MOUTH DAILY  . metFORMIN (GLUCOPHAGE) 1000 MG tablet Take 1 tablet (1,000 mg total) by mouth 2 (two) times daily with a meal.  . warfarin (COUMADIN) 5 MG tablet TAKE 2 TABLETS (10 MG TOTAL) BY MOUTH DAILY WITH SUPPER. OR AS INSTRUCTED BY ANTICOAGULATION CLINIC.   Assessment: 56 year old gentleman living alone in [place] in a ground level apartment; uncontrolled DMII, s/p right BKA, today reporting ruptured vesicle LLE without knowledge of injury  Acute Health Condition (skin tear) - I received a call from Hector Green who reported that he had a ruptured vesicle on his LLE. See image above. Patient denies injury and states he was sitting on the patio when he felt something unusual on his leg and looked down to see a large fluid filled blister; when he tried to cover the blister with a paper towel, the blister ruptured and the skin tore away; the LE is mildly warm to touch and slightly pink; pink coloration is consistent with baseline, warmth is new.   I notified Hector Green and applied sterile Telfa dressing followed by Kerlix and ACE bandage. I advised the patient  to keep area clean and dry.   Of note, Hector Green has an appointment on 11/09/17 with vascular surgery and is scheduled to ABI done on LLE at that time. I will copy this note to the vascular surgery team.   DME Needs - Hector Green office has been working with Hector Green at [facility] to help Hector Green acquire a motorized wheelchair; I spoke with Hector Green by phone and with Hector Green at Hector Green office in person to help facilitate movement of needed documentation.   [name] showed me today that his rolling walker with 3 inch wheels is broken; the left front wheel broke off; I have requested an order for a new walker from the office of Hector Green.   Chronic Health Condition (DM - uncontrolled) - Mr Hector Green had not been making any efforts towards self management of DM when he was first referred to [place]; he is now taking medications as prescribed including metformin; he was checking cbg's daily once we got one for him but has run out of lancets; I provided a short term supply of lancets to him today and asked Hector Green office to fax a prescription for refill to [employer]. I advised Hector Green to check his cbg daily and record between now and Monday when I will visit him at home again to follow up on his leg and his diabetes management.   Plan:   Hector Green will call Hector Green office for changes in his LLE skin wound or if he develops fever, swelling, pain, drainage, or bleeding.   Hector Green will check cbg's at least daily between now and Monday.   I will see Hector Green at home on Monday.   [facility] CM Care Plan Problem One     Most Recent Value  Care Plan Problem One  Home Safety and Mobility Concerns  Role Documenting the Problem One  Care Management Coordinator  Care Plan for Problem One  Active  [facility] Long Term Goal   Over the next 31 days, patient will verbalize receipt of new power wheelchair and understanding of safe use  [facility] Long Term Goal Start Date  10/19/17  Interventions for Problem One Long Term Goal  ongoing collaboration with [employer] and PCP office re: documentation needs,  updated patient on status  [facility] CM Short Term Goal #1   Over the next 30 days, patient will verbalize receipt of appointment for receipt of new power wheelchair  [facility] CM Short Term Goal #1 Start Date  10/19/17  Interventions for Short Term Goal #1  ongoing collaboration with PCP office in person today,  collaboration with DME company in person today    [place]  Problem Two     Most Recent Value  Care Plan Problem Two  Knoweldge Deficits related to self health appointment and transportation scheduling  Role Documenting the Problem Two  Care Management [place] for Problem Two  Active  [facility] CM Short Term Goal #1   Over the next 30 days, patient will demonstrate proficiency in indepdendently self scheduling provider and transportation appointments  [facility] CM Short Term Goal #1 Start Date  09/11/17  Interventions for Short Term Goal #2   provided patient with phone number for [facility] and instruction about how to self schedule appointments    [facility] CM Care Plan Problem Three     Most Recent Value  Care Plan Problem Three  Knowledge and Resource Deficits for Self  Health Management of Chronic Disease (DM and HTN)  Role Documenting the Problem Three  Care Management Coordinator  Care Plan for Problem Three  Active  [facility] Long Term Goal   Over the next 60 days, patient will demonstrate improvement in DM self health management as evidenced by daily monitoring and recording of cbg's, verbalized/demonstrated improvement in dietary choices, and adherence to prescribed medication regimen  [facility] Long Term Goal Start Date  10/19/17  Interventions for Problem Three Long Term Goal  reviewed with patient importance of ongoing and long term self health management of chronic diseases and necessity of reaching out to cre team with questions/needs  [facility] CM Short Term Goal #1   Over the next 30 days, patient will check cbg's daily and record  [facility] CM Short Term Goal #1 Start Date  10/19/17  Interventions for Short Term Goal #1  reviewed with patient to call if he is having difficulties with supplies or any other needed related to tools needed for self assessment  [facility] CM Short Term Goal #2   Over the next 30 days, patient will adhere to his prescribed medication regimen as evdienced by pill count and verbalization of adherence  [facility] CM Short Term Goal #2 Start Date  08/24/17    [facility] CM Short Term Goal #2 Met Date  10/19/17  Interventions for Short Term Goal #2  medication review comleted  [facility] CM Short Term Goal #3   over the next 30 days, patient will add one vegetable and one fruit selection to his daily diet  [facility]  CM Short Term Goal #3 Start Date  10/19/17  Interventions for Short Term Goal #3  reviewed current dietary habits and prescribed carb modified diet,  scheduled follow up  visit for more in depth treatment plan  [facility] CM Short Term Goal #4   Over the next 30 days, patient will verbalize understanding of plan of care for management of leg ulcer  [facility] CM Short Term Goal #4 Start Date  10/19/17  Interventions for Short Term Goal #4  PCP and vascular surgery notified of new skin condition/wound,  dressing applied as ordered,  reviewed early plan of care for management of skin condition with patient      [place] [facility]  [phone]

## 2017-10-20 ENCOUNTER — Other Ambulatory Visit: Payer: Self-pay | Admitting: *Deleted

## 2017-10-20 NOTE — Patient Outreach (Signed)
[facility]) Care Management  10/20/2017  Chales SalmonJohn W Burrough 11-06-1961 [id number]  Skin/Wound Care - Telephone call to [name] today to follow up on the ruptured vesicle on his left leg. He says he changed his dressing this morning, applying Neosporin to a sterile Telfa Pad, covering the vesicle with the Telfa pad, wrapping the leg gently with Kerlix, then covering the Kerlix with an elastic bandage to keep it in place. [name] denies pain at the site, fever, bleeding or oozing, or any new or worsened symptom.   DME Needs - I reached out to Baldo AshLisa Harris (3rd party billing and powered mobility @ [facility]) to inquire about receipt of needed signed documentation from Dr. [name]. I was unable to speak with Ms. Harris but left a message requesting a return call.   DM Management - [name] says he has not started checking his cbg's but will check it today and throughout the weekend.   Plan: I will see [name] at home on Monday to follow up on his leg/skin wound and DM management.    Marja Kayslisa Sourish Allender MHA,BSN,RN,CCM [facility]  [phone]

## 2017-10-23 ENCOUNTER — Other Ambulatory Visit: Payer: Self-pay | Admitting: *Deleted

## 2017-10-23 NOTE — Patient Outreach (Signed)
[facility]) Care Management  10/23/2017  Hector SalmonJohn W Bensen [birth year] [id number]  I arrived at the home of Hector Green today but he was not there. I called him and didn't get an answer but he called me back almost immediately to say he was "around the corner at an interview" (likely for his position at the [employer]). He had forgotten about our appointment when he received a call to come in for his interview. He reported that his skin/leg wound was improved and he was dressing it once daily. He couldn't tell me what his cbg's were over the weekend because his monitor was not with him.   I spoke with the nurse at Dr. Keane PoliceZack Hall's office who indicated that they would reach out to Hector Green to schedule an office visit with him for follow up of his skin condition.   Plan: I will follow up with Hector Green by the end of the week to ensure that he is not having problems with his leg and does indeed have a follow up appointment. In addition, I will ask him about his cbg findings.    Marja Kayslisa Gilboy MHA,BSN,RN,CCM [facility]  [phone]

## 2017-10-31 ENCOUNTER — Other Ambulatory Visit: Payer: Self-pay | Admitting: *Deleted

## 2017-10-31 NOTE — Patient Outreach (Signed)
[facility]) Care Management  10/31/2017  Hector SalmonJohn W Dimon 08-Sep-1961 [id number]  Hector Green is an 56 y.o. male [facility] by Dr. Novella OliveZack Hallfor care coordination and home safety evaluation. 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   Skin/Wound Care - Telephone call to [name] today to follow up on the ruptured vesicle on his left leg. He says he continues to change his dressing usinsg Neosporin, Telfa Pad, covering the vesicle with the Telfa pad, wrapping the leg gently with Kerlix, then covering the Kerlix with an elastic bandage to keep it in place. [name] denies pain at the site, fever, bleeding or oozing, or any new or worsened symptom. He would like for me to evaluate his progress and I will see him at home tomorrow.   DME Needs - I received a call from Baldo AshLisa Harris (3rd party billing and powered mobility [facility]) who said [name] was prior approved for his motorized wheelchair and that she is ordering it today. He should receive the wheelchair in approximately 10 days. I updated [name] about this.    DM Management - [name] says he has not been checking his cbg's. I will check it tomorrow during our visit and will re-establish a plan for diabetes self health management.   Plan: I will see [name] at home tomorrow.    Marja Kayslisa Gilboy MHA,BSN,RN,CCM [facility]  [phone]

## 2017-11-01 DIAGNOSIS — R7301 Impaired fasting glucose: Secondary | ICD-10-CM | POA: Diagnosis not present

## 2017-11-01 DIAGNOSIS — I10 Essential (primary) hypertension: Secondary | ICD-10-CM | POA: Diagnosis not present

## 2017-11-01 DIAGNOSIS — E1165 Type 2 diabetes mellitus with hyperglycemia: Secondary | ICD-10-CM | POA: Diagnosis not present

## 2017-11-06 DIAGNOSIS — G894 Chronic pain syndrome: Secondary | ICD-10-CM | POA: Diagnosis not present

## 2017-11-06 DIAGNOSIS — Z79891 Long term (current) use of opiate analgesic: Secondary | ICD-10-CM | POA: Diagnosis not present

## 2017-11-06 DIAGNOSIS — Z7689 Persons encountering health services in other specified circumstances: Secondary | ICD-10-CM | POA: Diagnosis not present

## 2017-11-06 DIAGNOSIS — M79662 Pain in left lower leg: Secondary | ICD-10-CM | POA: Diagnosis not present

## 2017-11-06 DIAGNOSIS — M545 Low back pain: Secondary | ICD-10-CM | POA: Diagnosis not present

## 2017-11-08 ENCOUNTER — Other Ambulatory Visit: Payer: Self-pay | Admitting: *Deleted

## 2017-11-08 NOTE — Patient Outreach (Signed)
[facility]N) Care Management  11/08/2017  Hector SalmonJohn W Green [birth year] [id number]  I spoke with [name] by phone today to follow up on wound care, diabetes management, and DME needs. 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   DM Management - [name] says he is checking his cbg's daily and that they are "better since starting that new medicine". He didn't have his glucose monitor with him and couldn't remember exactly what his morning cbg reading was but states that his cbgs have been between "the low 100's and always under 200". He denies symptoms of hypoglycemia.   [name] did report that he has run out of strips for his glucose monitor as of today. I offered to call the drug store to request a refill on his behalf while he is out volunteering and did so, requesting delivery.   LLE Wound - [name] says the skin tear on his LLE is healing. He is still applying neosporin ointment and covering the area with a 4x4 then covering with Kerlix and an ACE bandage. He says he is taking extra care to protect his leg when he is out in the community and applies an ABD pad in addition to his usual dressing on occasions like today when he is volunteering in the community. He reports having plenty of supplies today.   DME Needs - [name]'s motorized wheelchair is ordered and he anticipates a call as early as Friday to notify him that it is ready for delivery. Misty StanleyLisa at [facility] is to call [name] when it arrives. I asked [name] to notify me if he has not heard from [place] by Monday, November 13, 2017.   Plan: I will follow up with [name] by phone next week to ensure that he has received his test strips and is checking his cbg's daily as ordered and to follow up on the status of his wheelchair and leg wound.   [name] will check his cbg's daily and perform daily dressing changes.   THN CM Care Plan Problem One      Most Recent Value  Care Plan Problem One  Home Safety and Mobility Concerns  Role Documenting the Problem One  Care Management Coordinator  Care Plan for Problem One  Active  THN Long Term Goal   Over the next 31 days, patient will verbalize receipt of new power wheelchair and understanding of safe use  THN Long Term Goal Start Date  10/19/17  Interventions for Problem One Long Term Goal  reviewed anticipated delivery date and what to do if not received by 11/13/17  THN CM Short Term Goal #1   Over the next 30 days, patient will verbalize receipt of appointment for receipt of new power wheelchair  THN CM Short Term Goal #1 Start Date  10/19/17  Interventions for Short Term Goal #1  reviewed anticipated delivery date    [facility] CM Care Plan Problem Three     Most Recent Value  Care Plan Problem Three  Knowledge and Resource Deficits for Self Health Management of Chronic Disease (DM and HTN)  Role Documenting the Problem Three  Care Management Coordinator  Care Plan for Problem Three  Active  THN Long Term Goal   Over the next 60 days, patient will demonstrate improvement in DM self health management as evidenced by daily monitoring and recording of cbg's, verbalized/demonstrated improvement in dietary choices,  and adherence to prescribed medication regimen  THN Long Term Goal Start Date  10/19/17  Interventions for Problem Three Long Term Goal  reviewed monitoring practices with patient and current resource needs  THN CM Short Term Goal #1   Over the next 30 days, patient will check cbg's daily and record  THN CM Short Term Goal #1 Start Date  10/19/17  Interventions for Short Term Goal #1  reviewed cbg findings,  requested refill for monitoring strips  THN CM Short Term Goal #3   over the next 30 days, patient will add one vegetable and one fruit selection to his daily diet  THN  CM Short Term Goal #3 Start Date  10/19/17  Interventions for Short Term Goal #3  discused dietary choices and  prescribed diet with patient  THN CM Short Term Goal #4   Over the next 30 days, patient will verbalize understanding of plan of care for management of leg ulcer  THN CM Short Term Goal #4 Start Date  10/19/17  Interventions for Short Term Goal #4  reviewed current status of LLE wound and skin care regimen      Marja Kayslisa Nataya Bastedo MHA,BSN,RN,CCM [facility] Care Management  [phone]

## 2017-11-09 ENCOUNTER — Ambulatory Visit: Payer: Medicare Other | Admitting: Family

## 2017-11-09 ENCOUNTER — Encounter (HOSPITAL_COMMUNITY): Payer: Medicare Other

## 2017-11-13 ENCOUNTER — Other Ambulatory Visit: Payer: Self-pay | Admitting: *Deleted

## 2017-11-13 NOTE — Patient Outreach (Signed)
[facility]) Care Management  11/13/2017  Hector Green 04/29/1961 [id number]  Outreach to [name] this afternoon to follow up on his diabetes management, skin care, and DME needs. He has not heard from [name] re: his motorized wheelchair. I reached out to the mobility specialist to inquire about anticipated delivery and will follow up with her tomorrow. [name] says his cbg's are improving (not exceeding 200) since starting on Xultophy 100/3.6. [name] says his skin tear on the left shin is "slightly better but not healing as fast as I want it to". He has adequate dressing supplies at this time.   Plan: I will follow up with [place] tomorrow re: [name] motorized wheelchair.   THN CM Care Plan Problem One     Most Recent Value  Care Plan Problem One  Home Safety and Mobility Concerns  Role Documenting the Problem One  Care Management Coordinator  Care Plan for Problem One  Active  THN Long Term Goal   Over the next 31 days, patient will verbalize receipt of new power wheelchair and understanding of safe use  THN Long Term Goal Start Date  10/19/17  Interventions for Problem One Long Term Goal  outreach to mobility team at [place] re: anticipatd delivery date  [facility] CM Short Term Goal #1   Over the next 30 days, patient will verbalize receipt of appointment for receipt of new power wheelchair  THN CM Short Term Goal #1 Start Date  10/19/17  Interventions for Short Term Goal #1  discussed outreach to DME provider,  planned follow up for tomorrow    [facility] CM Care Plan Problem Three     Most Recent Value  Care Plan Problem Three  Knowledge and Resource Deficits for Self Health Management of Chronic Disease (DM and HTN)  Role Documenting the Problem Three  Care Management Coordinator  Care Plan for Problem Three  Active  THN Long Term Goal   Over the next 60 days, patient will demonstrate improvement in DM self health management as  evidenced by daily monitoring and recording of cbg's, verbalized/demonstrated improvement in dietary choices, and adherence to prescribed medication regimen  THN Long Term Goal Start Date  10/19/17  Interventions for Problem Three Long Term Goal  discussed progress with diabetes mangaement plan,  reviewed cbg findings,  reviewed medications  THN CM Short Term Goal #1   Over the next 30 days, patient will check cbg's daily and record  THN CM Short Term Goal #1 Start Date  10/19/17  Interventions for Short Term Goal #1  reviewed daily cbg findings  THN CM Short Term Goal #3   over the next 30 days, patient will add one vegetable and one fruit selection to his daily diet  THN  CM Short Term Goal #3 Start Date  10/19/17  [facility] CM Short Term Goal #3 Met Date  11/13/17  Interventions for Short Term Goal #3  patient reports improvement in dietary choices  THN CM Short Term Goal #4   Over the next 30 days, patient will verbalize understanding of plan of care for management of leg ulcer  THN CM Short Term Goal #4 Start Date  10/19/17  Interventions for Short Term Goal #4  discussed wound healing progression,  ensured patient has adequate dressing supplies,  reviwed signs adn symptoms of infection      [place] Care Management  [phone]

## 2017-11-14 ENCOUNTER — Other Ambulatory Visit: Payer: Self-pay | Admitting: *Deleted

## 2017-11-14 DIAGNOSIS — Z7689 Persons encountering health services in other specified circumstances: Secondary | ICD-10-CM | POA: Diagnosis not present

## 2017-11-14 NOTE — Patient Outreach (Addendum)
[facility]) Care Management  11/14/2017  Hector Green [birth year] [id number]  Telephone outreach to Hector Green, 3rd party billing and powered mobility specialist at [facility] who has been providing oversight and management of accessing a powered wheelchair for Hector Green. Hector Hector Green reports that Hector Green's powered wheelchair is in house at [facility] and is undergoing routine quality checks. She is to call Hector Green to set up delivery for the end of this week. I notified Hector Green of the same and he is very pleased to hear about receipt of his chair.   Plan: Hector Green is to call me if he has not heard from the mobility team by Friday.   I will follow up with Hector Green by phone next week.   THN CM Care Plan Problem One     Most Recent Value  Care Plan Problem One  Home Safety and Mobility Concerns  Role Documenting the Problem One  Care Management Coordinator  Care Plan for Problem One  Active  THN Long Term Goal   Over the next 31 days, patient will verbalize receipt of new power wheelchair and understanding of safe use  THN Long Term Goal Start Date  10/19/17  Interventions for Problem One Long Term Goal  follow up with patient and mobility specialist by phone  [facility] Short [facility] Goal #1   Over the next 30 days, patient will verbalize receipt of appointment for receipt of new power wheelchair  THN CM Short Term Goal #1 Start Date  10/19/17  Interventions for Short Term Goal #1  notified patient to anticipate call from mobility specialist for delivery appointment by end of week    [facility] Problem Three     Most Recent Value  Care Plan Problem Three  Knowledge and Resource Deficits for Self Health Management of Chronic Disease (DM and HTN)  Role Documenting the Problem Three  Care Management Coordinator  Care Plan for Problem Three  Active  THN Long Term Goal   Over the next 60 days, patient will demonstrate improvement in DM self health  management as evidenced by daily monitoring and recording of cbg's, verbalized/demonstrated improvement in dietary choices, and adherence to prescribed medication regimen  THN Long Term Goal Start Date  10/19/17  Interventions for Problem Three Long Term Goal  reviewed patient progress with diabetes management,  reviewed plan of care  [facility]N CM Short Term Goal #1   Over the next 30 days, patient will check cbg's daily and record  THN CM Short Term Goal #1 Start Date  10/19/17  Interventions for Short Term Goal #1  confirmed patient self health monitoring practices  THN CM Short Term Goal #4   Over the next 30 days, patient will verbalize understanding of plan of care for management of leg ulcer  [facility]N CM Short Term Goal #4 Start Date  10/19/17  Interventions for Short Term Goal #4  reviewed patient's assessment of leg wound and healing progress,  discussed ongoing wound care needs        Marja Kayslisa Marirose Deveney [facility] Hospital[facility]HA,BSN,RN,CCM [facility] Care Management  [phone]

## 2017-11-17 DIAGNOSIS — L03115 Cellulitis of right lower limb: Secondary | ICD-10-CM | POA: Diagnosis not present

## 2017-11-17 DIAGNOSIS — Z89511 Acquired absence of right leg below knee: Secondary | ICD-10-CM | POA: Diagnosis not present

## 2017-11-17 DIAGNOSIS — I10 Essential (primary) hypertension: Secondary | ICD-10-CM | POA: Diagnosis not present

## 2017-11-17 DIAGNOSIS — E1165 Type 2 diabetes mellitus with hyperglycemia: Secondary | ICD-10-CM | POA: Diagnosis not present

## 2017-11-23 ENCOUNTER — Other Ambulatory Visit: Payer: Self-pay | Admitting: *Deleted

## 2017-11-23 NOTE — Patient Outreach (Signed)
[facility]) Care Management  11/23/2017  Chales SalmonJohn W Adamik 1961/08/20 [id number]  Unable to reach [name] by phone today. I left a HIPPA compliant message requesting a return call.   Plan: I will reach out to [name] over the next 2 weeks to follow up on his diabetes management, skin condition, and DME needs.    Marja Kayslisa Gilboy MHA,BSN,RN,CCM [facility] Care Management  [phone]

## 2017-12-07 ENCOUNTER — Other Ambulatory Visit: Payer: Self-pay | Admitting: *Deleted

## 2017-12-07 NOTE — Patient Outreach (Signed)
[facility]N) Care Management  12/07/2017  Chales SalmonJohn W Badilla 1961/06/12 [id number]  Unable to reach [name] by phone. I left a HIPPA complaint voice message requesting a return call.   Plan: I will reach out to [name] again over the next few weeks.    Marja Kayslisa Matai Carpenito MHA,BSN,RN,CCM [facility] Care Management  [phone]

## 2017-12-12 DEATH — deceased

## 2017-12-14 ENCOUNTER — Telehealth (INDEPENDENT_AMBULATORY_CARE_PROVIDER_SITE_OTHER): Payer: Self-pay | Admitting: Orthopedic Surgery

## 2017-12-14 ENCOUNTER — Other Ambulatory Visit: Payer: Self-pay | Admitting: *Deleted

## 2017-12-14 NOTE — Progress Notes (Deleted)
Established Previous Bypass   History of Present Illness   Hector SalmonJohn W Eklund is a 57 y.o. (1961-08-18) male who presents with chief complaint: ***.  Previous operation(s) include:   1. TE R fem-PT c/w revision of proximal and distal anastomosis by Dr. Hart RochesterLawson (05/27/15) 2. TE R fem-PT by Dr. Arbie CookeyEarly (05/13/15) 3. R fem-PT BPG w/ contra NR GSV by Dr. Hart RochesterLawson (05/13/15) 4. R BKA with multiple revisions by Dr. Lajoyce Cornersuda (07/08/15-10/30/15)  The patient has been lost to follow up.  The patient's symptoms have *** progressed.  The patient's symptoms are: ***. The patient's treatment regimen currently included: maximal medical management ***and ***.  ***Pt continues to smoke.  The patient's PMH, PSH, SH, and FamHx are unchanged from 07/07/15.  Current Outpatient Medications  Medication Sig Dispense Refill  . aspirin EC 81 MG tablet Take 81 mg by mouth daily.    Marland Kitchen. atorvastatin (LIPITOR) 40 MG tablet Take 40 mg by mouth daily at 6 PM.     . clobetasol cream (TEMOVATE) 0.05 % Apply 1 application topically 2 (two) times daily.    Marland Kitchen. gabapentin (NEURONTIN) 300 MG capsule Take 300 mg by mouth 3 (three) times daily.     Marland Kitchen. glimepiride (AMARYL) 2 MG tablet TAKE 1 TABLET BY MOUTH DAILY WITH BREAKFAST. 90 tablet 0  . HYDROcodone-acetaminophen (NORCO/VICODIN) 5-325 MG tablet Take 1 tablet by mouth every 4 (four) hours as needed. 10 tablet 0  . lisinopril (PRINIVIL,ZESTRIL) 5 MG tablet TAKE 1 TABLET BY MOUTH DAILY 30 tablet 0  . metFORMIN (GLUCOPHAGE) 1000 MG tablet Take 1 tablet (1,000 mg total) by mouth 2 (two) times daily with a meal. 180 tablet 3  . warfarin (COUMADIN) 5 MG tablet TAKE 2 TABLETS (10 MG TOTAL) BY MOUTH DAILY WITH SUPPER. OR AS INSTRUCTED BY ANTICOAGULATION CLINIC. 60 tablet 1   No current facility-administered medications for this visit.     On ROS today: ***, ***   Physical Examination  ***There were no vitals filed for this visit. ***There is no height or weight on file to calculate  BMI.  General {LOC:19197::"Somulent","Alert"}, {Orientation:19197::"Confused","O x 3"}, {Weight:19197::"Obese","Cachectic","WD"}, {General state of health:19197::"Ill appearing","Elderly","NAD"}  Pulmonary {Chest wall:19197::"Asx chest movement","Sym exp"}, {Air movt:19197::"Decreased *** air movt","good B air movt"}, {BS:19197::"rales on ***","rhonchi on ***","wheezing on ***","CTA B"}  Cardiac {Rhythm:19197::"Irregularly, irregular rate and rhythm","RRR, Nl S1, S2"}, {Murmur:19197::"Murmur present: ***","no Murmurs"}, {Rubs:19197::"Rub present: ***","No rubs"}, {Gallop:19197::"Gallop present: ***","No S3,S4"}  Vascular Vessel Right Left  Radial {Palpable:19197::"Not palpable","Faintly palpable","Palpable"} {Palpable:19197::"Not palpable","Faintly palpable","Palpable"}  Brachial {Palpable:19197::"Not palpable","Faintly palpable","Palpable"} {Palpable:19197::"Not palpable","Faintly palpable","Palpable"}  Carotid Palpable, {Bruit:19197::"Bruit present","No Bruit"} Palpable, {Bruit:19197::"Bruit present","No Bruit"}  Aorta Not palpable N/A  Femoral {Palpable:19197::"Not palpable","Faintly palpable","Palpable"} {Palpable:19197::"Not palpable","Faintly palpable","Palpable"}  Popliteal {Palpable:19197::"Prominently palpable","Not palpable"} {Palpable:19197::"Prominently palpable","Not palpable"}  PT {Palpable:19197::"Not palpable","Faintly palpable","Palpable"} {Palpable:19197::"Not palpable","Faintly palpable","Palpable"}  DP {Palpable:19197::"Not palpable","Faintly palpable","Palpable"} {Palpable:19197::"Not palpable","Faintly palpable","Palpable"}    Gastro- intestinal soft, {Distension:19197::"distended","non-distended"}, {TTP:19197::"TTP in *** quadrant","appropriate tenderness to palpation","non-tender to palpation"}, {Guarding:19197::"Guarding and rebound in *** quadrant","No guarding or rebound"}, {HSM:19197::"HSM present","no HSM"}, {Masses:19197::"Mass present: ***","no masses"},  {Flank:19197::"CVAT on ***","Flank bruit present on ***","no CVAT B"}, {AAA:19197::"Palpable prominent aortic pulse","No palpable prominent aortic pulse"}, {Surgical incision:19197::"Surg incisions: ***","Surgical incisions well healed"," "}  Musculo- skeletal M/S 5/5 throughout {MS:19197::"except ***"," "}, Extremities without ischemic changes {MS:19197::"except ***"," "}, {Edema:19197::"Pitting edema present: ***","Non-pitting edema present: ***","No edema present"}, {Varicosities:19197::"Varicosities present: ***","No visible varicosities "}, {LDS:19197::"Lipodermatosclerosis present: ***","No Lipodermatosclerosis present"}  Neurologic Pain and light touch intact in extremities{CN:19197::" except for decreased sensation in ***"," "}, Motor exam as listed above  Non-Invasive Vascular Imaging ABI (***)  R:   ABI: *** (***),   PT: {Signals:19197::"none","mono","bi","tri"}  DP: {Signals:19197::"none","mono","bi","tri"}  TBI:  ***  L:   ABI: *** (***),   PT: {Signals:19197::"none","mono","bi","tri"}  DP: {Signals:19197::"none","mono","bi","tri"}  TBI: ***  Aortoiliac Duplex (***)  Ao: *** c/s  R iliac: *** c/s  L iliac: *** c/s  Bypass Duplex (***)  Proximal to bypass: *** c/s  With-in bypass: *** c/s  Distal to bypass: *** c/s   Medical Decision Making   Hector Green is a 57 y.o. 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  I discussed in depth with the patient the nature of atherosclerosis, and emphasized the importance of maximal medical management including strict control of blood pressure, blood glucose, and lipid levels, obtaining regular exercise, and cessation of smoking.    The patient is aware that without maximal medical management  the underlying atherosclerotic disease process will progress, limiting the benefit of any interventions.  I discussed in depth with the patient the need for long term surveillance to improve the primary assisted patency of his bypass.  The patient agrees to cooperate with such. The patient is currently on a statin: Lipitor.  The patient is currently on an anti-platelet: ASA.  Thank you for allowing [place] to participate in this patient's care.   Leonides Sake, MD, FACS [facility] Office: [phone] Pager: [phone]

## 2017-12-14 NOTE — Telephone Encounter (Signed)
Dahlia BailiffAlisha Gilboy (nurse case manager) with [facility] called to let Dr Lajoyce Cornersuda  know patient is deceased.

## 2017-12-14 NOTE — Telephone Encounter (Signed)
Dr. Lajoyce Cornersuda notified

## 2017-12-14 NOTE — Patient Outreach (Signed)
[facility]) Care Management  12/14/2017  Hector SalmonJohn W Green Aug 09, 1961 [id number]  [facility] was notified today that Mr. [name] is deceased. We are very sorry to hear of his untimely passing and appreciate the opportunity to have participated in his care.   Plan: Case Closure.    Marja Kayslisa Gilboy MHA,BSN,RN,CCM [facility] Care Management  [phone]

## 2017-12-15 ENCOUNTER — Ambulatory Visit: Payer: Medicare Other | Admitting: Vascular Surgery

## 2017-12-15 ENCOUNTER — Encounter (HOSPITAL_COMMUNITY): Payer: Medicare Other

## 2017-12-18 ENCOUNTER — Ambulatory Visit: Payer: Self-pay | Admitting: *Deleted
